# Patient Record
Sex: Male | Born: 1940 | Race: Black or African American | Marital: Single | Smoking: Former smoker
Health system: Southern US, Community
[De-identification: ages and names within clinical notes are randomized; demographics above are authoritative.]

## PROBLEM LIST (undated history)

## (undated) DIAGNOSIS — E119 Type 2 diabetes mellitus without complications: Secondary | ICD-10-CM

## (undated) DIAGNOSIS — E785 Hyperlipidemia, unspecified: Secondary | ICD-10-CM

## (undated) DIAGNOSIS — I2510 Atherosclerotic heart disease of native coronary artery without angina pectoris: Secondary | ICD-10-CM

## (undated) DIAGNOSIS — Z720 Tobacco use: Secondary | ICD-10-CM

## (undated) DIAGNOSIS — I10 Essential (primary) hypertension: Secondary | ICD-10-CM

## (undated) HISTORY — PX: CARDIAC CATHETERIZATION: SHX172

## (undated) HISTORY — DX: Hyperlipidemia, unspecified: E78.5

---

## 2004-07-21 ENCOUNTER — Emergency Department: Payer: Self-pay | Admitting: General Practice

## 2006-04-22 ENCOUNTER — Emergency Department: Payer: Self-pay | Admitting: Emergency Medicine

## 2008-12-24 ENCOUNTER — Ambulatory Visit: Payer: Self-pay | Admitting: Internal Medicine

## 2008-12-31 ENCOUNTER — Ambulatory Visit: Payer: Self-pay | Admitting: Internal Medicine

## 2009-07-05 ENCOUNTER — Ambulatory Visit: Payer: Self-pay | Admitting: Internal Medicine

## 2009-10-31 ENCOUNTER — Ambulatory Visit: Payer: Self-pay | Admitting: Internal Medicine

## 2012-09-11 HISTORY — PX: CORONARY ANGIOPLASTY: SHX604

## 2012-09-30 ENCOUNTER — Encounter (HOSPITAL_COMMUNITY)
Admission: EM | Disposition: A | Discharge status: Home / Self Care | Payer: Self-pay | Source: Other Acute Inpatient Hospital | Attending: Cardiovascular Disease

## 2012-09-30 ENCOUNTER — Emergency Department: Payer: Self-pay | Admitting: Emergency Medicine

## 2012-09-30 ENCOUNTER — Encounter (HOSPITAL_COMMUNITY): Payer: Self-pay | Admitting: Internal Medicine

## 2012-09-30 ENCOUNTER — Other Ambulatory Visit: Payer: Self-pay

## 2012-09-30 ENCOUNTER — Inpatient Hospital Stay (HOSPITAL_COMMUNITY)
Admission: EM | Admit: 2012-09-30 | Discharge: 2012-10-03 | DRG: 247 | Disposition: A | Discharge status: Home / Self Care | Payer: PRIVATE HEALTH INSURANCE | Source: Other Acute Inpatient Hospital | Attending: Cardiovascular Disease | Admitting: Cardiovascular Disease

## 2012-09-30 DIAGNOSIS — Z79899 Other long term (current) drug therapy: Secondary | ICD-10-CM

## 2012-09-30 DIAGNOSIS — Z7982 Long term (current) use of aspirin: Secondary | ICD-10-CM

## 2012-09-30 DIAGNOSIS — I2582 Chronic total occlusion of coronary artery: Secondary | ICD-10-CM | POA: Diagnosis present

## 2012-09-30 DIAGNOSIS — I2510 Atherosclerotic heart disease of native coronary artery without angina pectoris: Secondary | ICD-10-CM

## 2012-09-30 DIAGNOSIS — I471 Supraventricular tachycardia, unspecified: Secondary | ICD-10-CM | POA: Diagnosis present

## 2012-09-30 DIAGNOSIS — Z955 Presence of coronary angioplasty implant and graft: Secondary | ICD-10-CM

## 2012-09-30 DIAGNOSIS — I2129 ST elevation (STEMI) myocardial infarction involving other sites: Principal | ICD-10-CM

## 2012-09-30 DIAGNOSIS — E876 Hypokalemia: Secondary | ICD-10-CM | POA: Diagnosis not present

## 2012-09-30 DIAGNOSIS — N179 Acute kidney failure, unspecified: Secondary | ICD-10-CM | POA: Diagnosis present

## 2012-09-30 DIAGNOSIS — I10 Essential (primary) hypertension: Secondary | ICD-10-CM | POA: Diagnosis present

## 2012-09-30 DIAGNOSIS — Z720 Tobacco use: Secondary | ICD-10-CM

## 2012-09-30 DIAGNOSIS — F17200 Nicotine dependence, unspecified, uncomplicated: Secondary | ICD-10-CM | POA: Diagnosis present

## 2012-09-30 DIAGNOSIS — Z7902 Long term (current) use of antithrombotics/antiplatelets: Secondary | ICD-10-CM

## 2012-09-30 DIAGNOSIS — I959 Hypotension, unspecified: Secondary | ICD-10-CM | POA: Diagnosis present

## 2012-09-30 HISTORY — DX: Atherosclerotic heart disease of native coronary artery without angina pectoris: I25.10

## 2012-09-30 HISTORY — DX: Tobacco use: Z72.0

## 2012-09-30 HISTORY — PX: LEFT HEART CATH: SHX5478

## 2012-09-30 HISTORY — PX: PERCUTANEOUS CORONARY STENT INTERVENTION (PCI-S): SHX5485

## 2012-09-30 HISTORY — DX: Essential (primary) hypertension: I10

## 2012-09-30 HISTORY — PX: LEFT HEART CATHETERIZATION WITH CORONARY ANGIOGRAM: SHX5451

## 2012-09-30 LAB — CBC
HGB: 14.1 g/dL (ref 13.0–18.0)
MCH: 29.8 pg (ref 26.0–34.0)
MCV: 92 fL (ref 80–100)
RBC: 4.73 10*6/uL (ref 4.40–5.90)
WBC: 14.8 10*3/uL — ABNORMAL HIGH (ref 3.8–10.6)

## 2012-09-30 LAB — BASIC METABOLIC PANEL
BUN: 23 mg/dL — ABNORMAL HIGH (ref 7–18)
Chloride: 103 mmol/L (ref 98–107)
Co2: 27 mmol/L (ref 21–32)
EGFR (African American): 39 — ABNORMAL LOW
Osmolality: 277 (ref 275–301)
Sodium: 136 mmol/L (ref 136–145)

## 2012-09-30 LAB — PRO B NATRIURETIC PEPTIDE: B-Type Natriuretic Peptide: 13559 pg/mL — ABNORMAL HIGH (ref 0–125)

## 2012-09-30 LAB — HEPATIC FUNCTION PANEL A (ARMC)
Bilirubin, Direct: <0.05 mg/dL (ref 0.00–0.20)
Bilirubin,Total: 0.6 mg/dL (ref 0.2–1.0)
SGOT(AST): 164 U/L — ABNORMAL HIGH (ref 15–37)
SGPT (ALT): 41 U/L (ref 12–78)
Total Protein: 7.2 g/dL (ref 6.4–8.2)

## 2012-09-30 LAB — MAGNESIUM: Magnesium: 1.9 mg/dL

## 2012-09-30 LAB — TROPONIN I
Troponin I: >20 ng/mL (ref <0.30)
Troponin-I: 24 ng/mL — ABNORMAL HIGH

## 2012-09-30 LAB — TSH: Thyroid Stimulating Horm: 3.31 u[IU]/mL

## 2012-09-30 LAB — MRSA PCR SCREENING: MRSA by PCR: NEGATIVE

## 2012-09-30 SURGERY — LEFT HEART CATHETERIZATION WITH CORONARY ANGIOGRAM
Anesthesia: LOCAL

## 2012-09-30 MED ORDER — TICAGRELOR 90 MG PO TABS
ORAL_TABLET | ORAL | Status: AC
  Filled 2012-09-30: qty 2

## 2012-09-30 MED ORDER — ATROPINE SULFATE 1 MG/ML IJ SOLN
INTRAMUSCULAR | Status: AC
  Filled 2012-09-30: qty 1

## 2012-09-30 MED ORDER — OXYCODONE-ACETAMINOPHEN 5-325 MG PO TABS: 1.0000 | ORAL_TABLET | ORAL | Status: DC | PRN

## 2012-09-30 MED ORDER — SODIUM CHLORIDE 0.9 % IV SOLN: INTRAVENOUS | Status: DC

## 2012-09-30 MED ORDER — ACETAMINOPHEN 325 MG PO TABS: 650.0000 mg | ORAL_TABLET | ORAL | Status: DC | PRN

## 2012-09-30 MED ORDER — ONDANSETRON HCL 4 MG/2ML IJ SOLN: 4.0000 mg | Freq: Four times a day (QID) | INTRAMUSCULAR | Status: DC | PRN

## 2012-09-30 MED ORDER — ASPIRIN EC 81 MG PO TBEC
81.0000 mg | DELAYED_RELEASE_TABLET | Freq: Every day | ORAL | Status: DC
  Administered 2012-10-01 – 2012-10-03 (×3): 81 mg via ORAL
  Filled 2012-09-30 (×3): qty 1

## 2012-09-30 MED ORDER — ATORVASTATIN CALCIUM 80 MG PO TABS
80.0000 mg | ORAL_TABLET | Freq: Every day | ORAL | Status: DC
  Administered 2012-09-30 – 2012-10-02 (×3): 80 mg via ORAL
  Filled 2012-09-30 (×4): qty 1

## 2012-09-30 MED ORDER — NITROGLYCERIN 0.4 MG SL SUBL: 0.4000 mg | SUBLINGUAL_TABLET | SUBLINGUAL | Status: DC | PRN

## 2012-09-30 MED ORDER — ASPIRIN 81 MG PO CHEW: 81.0000 mg | CHEWABLE_TABLET | Freq: Every day | ORAL | Status: DC

## 2012-09-30 MED ORDER — ASPIRIN 81 MG PO CHEW: 324.0000 mg | CHEWABLE_TABLET | ORAL | Status: DC

## 2012-09-30 MED ORDER — ASPIRIN 300 MG RE SUPP: 300.0000 mg | RECTAL | Status: DC

## 2012-09-30 MED ORDER — METOPROLOL TARTRATE 25 MG PO TABS
25.0000 mg | ORAL_TABLET | Freq: Two times a day (BID) | ORAL | Status: DC
  Administered 2012-09-30 – 2012-10-03 (×6): 25 mg via ORAL
  Filled 2012-09-30 (×7): qty 1

## 2012-09-30 MED ORDER — MORPHINE SULFATE 2 MG/ML IJ SOLN: 2.0000 mg | INTRAMUSCULAR | Status: DC | PRN

## 2012-09-30 MED ORDER — TICAGRELOR 90 MG PO TABS
90.0000 mg | ORAL_TABLET | Freq: Two times a day (BID) | ORAL | Status: DC
  Administered 2012-10-01 – 2012-10-03 (×5): 90 mg via ORAL
  Filled 2012-09-30 (×6): qty 1

## 2012-09-30 NOTE — CV Procedure (Signed)
   Cardiac Catheterization Operative Report  Thomas Sexton [id number] 5/20/20147:07 PM No primary provider on file.  Procedure Performed:  1. Left Heart Catheterization 2. Selective Coronary Angiography 3. Left ventricular pressures 4. PTCA/DES x 1 first obtuse marginal   Operator: [name], MD  Indication:   72 yo male with history of tobacco abuse transferred from [facility] with lateral STEMI. Presented to [facility] today with c/o SOB. Found to be in SVT. He converted to NSR but had ST elevation noted laterally. No CP. Code STEMI called and pt transferred to [facility].                                   Procedure Details: Emergency consent obtained.  The patient concurred with the proposed plan, giving informed verbal consent. The patient was brought to the cath lab via EMS. The patient was further sedated with Versed and Fentanyl. The right groin was prepped and draped in the usual manner. Using the modified Seldinger access technique, a 6 French sheath was placed in the right femoral artery. Standard diagnostic catheters were used to perform selective coronary angiography. The JR4 was used to cross the AV into the LV. Pressures were measured. No LV gram. He was found to have a total occlusion of the first OM branch of the Circumflex.   PCI Note: The left main was engaged with a XB 3.0 guide. He was given a bolus of Angiomax and a drip was started. He was given Brilinta 180 mg po x 1. When the ACT was greater than 200, I passed a BMW wire down the Circumflex into the first OM branch beyond the total occlusion. I then pre-dilated the stenosis with a 2.5 x 12 mm balloon x 1. I then carefully positioned and deployed a 2.5 x 16 mm Promus Premier DES in the first OM branch. The stent was post-dilated with a 2.5 x 12 mm Starks balloon x 1. IC NTG given ( x2). There was significant wire bias. The wire was pulled back and the vessel was noted to be widely patent. There was no edge tear noted. There  was excellent flow into the distal vessel.   There were no immediate complications. The patient was taken to the CCU in stable condition.   Hemodynamic Findings: Central aortic pressure: 105/66 Left ventricular pressure: 116/3/9  Angiographic Findings:  Left main: No obstructive disease.   Left Anterior Descending Artery: Large caliber vessel that courses to the apex with 40% mid stenosis, 30% distal stenosis. The diagonal branch is moderate in caliber with 50% stenosis.   Circumflex Artery: Moderate caliber vessel with mild plaque proximally with 100% occlusion of the first OM branch.  Right Coronary Artery: Moderate caliber, dominant vessel with 30% mid stenosis. The PDA has 30% proximal stenosis.   Left Ventricular Angiogram: Deferred.   Impression: 1. Acute lateral STEMI secondary to occluded first obtuse marginal branch 2. 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 Successful PTCA/DES x 1 first OM  Recommendations: ASA and Brilinta for one year. Will start beta blocker, statin. Will get echo in am to assess LVEF.        Complications:  None. The patient tolerated the procedure well.

## 2012-09-30 NOTE — H&P (Signed)
History and Physical  Patient ID: Thomas Sexton MRN: [id number], SOB: 31-Mar-1941 72 y.o. Date of Encounter: 09/30/2012, 6:27 PM  Primary Physician: No primary provider on file. Primary Cardiologist: None  Chief Complaint: SOB, STEMI  HPI: 72 y.o. male w/ PMHx significant for HTN who presented to [facility] on 09/30/2012 with complaints of SOB.  The patient notes a 2-day history of feelings of shortness of breath, with associated lightheadedness and nausea.  Symptoms are not worsened by exertion or relieved by rest, and are not associated with any chest pain or palpitations.  He presented to [facility] for evaluation of this SOB, and was found to be in SVT with a rate in the 180's.  After breaking the SVT (?chemical, ?electrical), the patient's HR reduced to the low 100's, with evidence of ST elevation in infero-lateral leads (I, II, V5, V6).  He was given aspirin and a heparin bolus, transferred to [facility], and taken directly to the cath lab. Labs are significant for a creatinine of 1.9 at .   Past Medical History  Diagnosis Date  . Hypertension      Surgical History: No past surgical history on file.   Home Meds: Prior to Admission medications   Not on File  The patient notes no home medications (including no aspirin)  Allergies: No Known Allergies  History   Social History  . Marital Status: N/A    Spouse Name: N/A    Number of Children: N/A  . Years of Education: N/A   Occupational History  . Not on file.   Social History Main Topics  . Smoking status: Current Every Day Smoker -- 50 years  . Smokeless tobacco: Not on file  . Alcohol Use: No  . Drug Use: Not on file  . Sexually Active: Not on file   Other Topics Concern  . Not on file   Social History Narrative  . No narrative on file     No family history on file. No family history of heart disease  Review of Systems: General: negative for chills, fever, night sweats or weight changes.   ENT: negative for rhinorrhea or epistaxis Cardiovascular: negative for chest pain, edema, orthopnea, palpitations, or paroxysmal nocturnal dyspnea Dermatological: negative for rash Respiratory: negative for cough or wheezing GI: negative for diarrhea, bright red blood per rectum, melena, or hematemesis GU: no hematuria, urgency, or frequency Neurologic: negative for visual changes, syncope, headache, or dizziness Heme: no easy bruising or bleeding Endo: negative for excessive thirst, thyroid disorder, or flushing Musculoskeletal: negative for joint pain or swelling, negative for myalgias All other systems reviewed and are otherwise negative except as noted above.  Physical Exam: There were no vitals taken for this visit. General: Well developed, thin, alert and oriented, in no acute distress. HEENT: Normocephalic, atraumatic, sclera non-icteric Neck: Supple. JVP normal Lungs: Clear bilaterally to auscultation  Heart: RRR Abdomen: Soft, non-tender, non-distended  Msk:  Strength and tone appear normal for age. Extremities: No clubbing, cyanosis, or edema.  Neuro: CNII-XII intact, moves all extremities spontaneously. Psych:  Responds to questions appropriately with a normal affect.   Labs:   No results found for this basename: WBC, HGB, HCT, MCV, PLT   No results found for this basename: NA, K, CL, CO2, BUN, CREATININE, CALCIUM, LABALBU, PROT, BILITOT, ALKPHOS, ALT, AST, GLUCOSE,  in the last 168 hours No results found for this basename: CKTOTAL, CKMB, TROPONINI,  in the last 72 hours No results found for this basename: CHOL, HDL,  LDLCALC, TRIG   No results found for this basename: DDIMER   Outside Labs from [facility] WBC: 14.8 Hb: 14.1 HCT: 43.3 Plt: 214  Na: 136 K: 4.3 Cl: 103 CO2: 27 BUN: 23 Cr: 1.92 Glucose: 131 Ca: 9.4 Mg: 1.9 Albumin: 3.9  D-dimer: 0.29  Radiology/Studies:  No results found.   EKG: Sinus tachycardia, ST elevation in leads I, II,  V5, V6  ASSESSMENT AND PLAN:  The patient is a 73 yo man, history of HTN, presenting with STEMI.  # STEMI - the patient presents with ST elevations, which appeared after an episode of SVT.  The patient was completely chest pain free, but does note SOB for 2 days prior to presentation.  This may represent acute vessel occlusion vs induced ischemia from SVT.  The patient was taken directly to cath lab.  He was given aspirin and heparin bolus at [place]. -taken for cardiac cath -continue aspirin -started on brilinta, plan to transition to effient -start atorvastatin -if BP tolerates, start metoprolol -oxygen, morphine prn -lipid panel, A1C in AM for risk stratification  # Hypertension - patient notes history of HTN, but not on any outpatient BP meds -start metoprolol if BP tolerates  # AKI vs CKD - the patient's creatinine was 1.9 at [place].  It's unclear if this is an acute change or the patient's baseline. -repeat BMET -monitor I/O's -start IVF following cath  SignedJanalyn Harder  09/30/2012, 6:27 PM  I have personally seen and examined this patient with Janalyn Harder, MD. I agree with the assessment and plan as outlined above. Lateral STEMI. C/o SOB. Plans for emergent cardiac cath tonight with possible PCI.   MCALHANY,CHRISTOPHER 7:27 PM 09/30/2012

## 2012-10-01 DIAGNOSIS — N179 Acute kidney failure, unspecified: Secondary | ICD-10-CM | POA: Diagnosis present

## 2012-10-01 DIAGNOSIS — I2510 Atherosclerotic heart disease of native coronary artery without angina pectoris: Secondary | ICD-10-CM

## 2012-10-01 DIAGNOSIS — I10 Essential (primary) hypertension: Secondary | ICD-10-CM | POA: Diagnosis present

## 2012-10-01 DIAGNOSIS — E876 Hypokalemia: Secondary | ICD-10-CM | POA: Diagnosis present

## 2012-10-01 LAB — CBC
HCT: 36.6 % — ABNORMAL LOW (ref 39.0–52.0)
MCHC: 34.2 g/dL (ref 30.0–36.0)
Platelets: 200 10*3/uL (ref 150–400)
RDW: 13.1 % (ref 11.5–15.5)
WBC: 12.7 10*3/uL — ABNORMAL HIGH (ref 4.0–10.5)

## 2012-10-01 LAB — BASIC METABOLIC PANEL
BUN: 27 mg/dL — ABNORMAL HIGH (ref 6–23)
Chloride: 102 mEq/L (ref 96–112)
GFR calc Af Amer: 56 mL/min — ABNORMAL LOW (ref >90)
GFR calc non Af Amer: 48 mL/min — ABNORMAL LOW (ref >90)
Potassium: 3.3 mEq/L — ABNORMAL LOW (ref 3.5–5.1)

## 2012-10-01 LAB — TROPONIN I: Troponin I: >20 ng/mL (ref <0.30)

## 2012-10-01 MED ORDER — SODIUM CHLORIDE 0.9 % IV SOLN
INTRAVENOUS | Status: DC
  Administered 2012-10-01 – 2012-10-03 (×4): via INTRAVENOUS

## 2012-10-01 MED ORDER — BOOST / RESOURCE BREEZE PO LIQD
1.0000 | Freq: Two times a day (BID) | ORAL | Status: DC
  Administered 2012-10-01: 0.9875 via ORAL
  Administered 2012-10-01 – 2012-10-03 (×4): 1 via ORAL

## 2012-10-01 MED ORDER — POTASSIUM CHLORIDE CRYS ER 20 MEQ PO TBCR
40.0000 meq | EXTENDED_RELEASE_TABLET | ORAL | Status: AC
  Administered 2012-10-01 (×2): 40 meq via ORAL
  Filled 2012-10-01: qty 2

## 2012-10-01 MED ORDER — SODIUM CHLORIDE 0.9 % IJ SOLN
3.0000 mL | Freq: Two times a day (BID) | INTRAMUSCULAR | Status: DC
  Administered 2012-10-02 – 2012-10-03 (×2): 3 mL via INTRAVENOUS

## 2012-10-01 MED ORDER — POTASSIUM CHLORIDE CRYS ER 20 MEQ PO TBCR
EXTENDED_RELEASE_TABLET | ORAL | Status: AC
  Filled 2012-10-01: qty 2

## 2012-10-01 MED FILL — Fentanyl Citrate Inj 0.05 MG/ML: INTRAMUSCULAR | Qty: 2 | Status: AC

## 2012-10-01 MED FILL — Heparin Sodium (Porcine) 2 Unit/ML in Sodium Chloride 0.9%: INTRAMUSCULAR | Qty: 2000 | Status: AC

## 2012-10-01 MED FILL — Bivalirudin Trifluoroacetate For IV Soln 250 MG (Base Equiv): INTRAVENOUS | Qty: 250 | Status: AC

## 2012-10-01 MED FILL — Sodium Chloride IV Soln 0.9%: INTRAVENOUS | Qty: 50 | Status: AC

## 2012-10-01 MED FILL — Midazolam HCl Inj 2 MG/2ML (Base Equivalent): INTRAMUSCULAR | Qty: 2 | Status: AC

## 2012-10-01 MED FILL — Nitroglycerin IV Soln 5 MG/ML: INTRAVENOUS | Qty: 10 | Status: AC

## 2012-10-01 MED FILL — Lidocaine HCl Local Preservative Free (PF) Inj 1%: INTRAMUSCULAR | Qty: 30 | Status: AC

## 2012-10-01 NOTE — Progress Notes (Signed)
CARDIAC REHAB PHASE I   PRE:  Rate/Rhythm: 70 SR    BP: lying 110/49, sitting 129/77, standing 114/70    SaO2: 97 RA  MODE:  Ambulation: 350 ft   POST:  Rate/Rhythm: 83 Sr     BP: sitting 98/76     SaO2: 99 RA  Took orthostatics, no problems, no sx. Slightly wobbly walking, no c/o. BP slightly lower upon return to room. To recliner. Began ed process, voiced understanding. Will f/u. [id number]   Elissa Lovett [place] CES, ACSM 10/01/2012 1:32 PM

## 2012-10-01 NOTE — Progress Notes (Signed)
Rt femoral sheath dicontued post ACT (165). Manual pressure held to site for with good effect. Rt groin site soft, no hematoma and pressure drsg applied. Post cath instructions given to pt re keeping rt leg straight (bedrest),apply pressure to site with movement (sneezing, coughing etc) Pt verbalized understanding. Will cont to monitor closely.

## 2012-10-01 NOTE — Progress Notes (Signed)
Echocardiogram 2D Echocardiogram has been performed.  Thomas Sexton 10/01/2012, 1:16 PM

## 2012-10-01 NOTE — Progress Notes (Signed)
Subjective:  The patient was admitted yesterday with mild SOB, found to have obstruction of his 1st OM, s/p BMS.  Overnight, the patient notes no SOB, cp, LH, or palpitations.  The patient's vitals were stable overnight, though this morning his BP has dropped to the SBP 80's and his HR has increased to the 130's.    Objective:  Vital Signs in the last 24 hours: Temp:  [97.6 F (36.4 C)-98.5 F (36.9 C)] 97.6 F (36.4 C) (05/21 0400) Pulse Rate:  [67-117] 117 (05/21 0615) Resp:  [5-26] 23 (05/21 0615) BP: (74-183)/(36-170) 74/48 mmHg (05/21 0615) SpO2:  [90 %-100 %] 97 % (05/21 0615) Arterial Line BP: (128-201)/(73-137) 184/125 mmHg (05/20 2145) Weight:  [134 lb 9 oz (61.037 kg)-134 lb 14.4 oz (61.19 kg)] 134 lb 9 oz (61.037 kg) (05/20 2053)  Intake/Output from previous day: 05/20 0701 - 05/21 0700 In: 341.7 [P.O.:80; I.V.:261.7] Out: 350 [Urine:350]  Physical Exam: General: alert, cooperative, and in no apparent distress HEENT: pupils equal round and reactive to light, vision grossly intact, oropharynx clear and non-erythematous  Neck: supple, no lymphadenopathy, no JVD Lungs: clear to ascultation bilaterally, normal work of respiration, no wheezes, rales, ronchi Heart: tachycardic, regular rhythm, loud S1, no m/g/r Abdomen: soft, non-tender, non-distended, normal bowel sounds Extremities: no cyanosis, clubbing, or edema Neurologic: alert & oriented X3, cranial nerves II-XII intact, strength grossly intact, sensation intact to light touch   Lab Results:  Recent Labs  10/01/12 0203  WBC 12.7*  HGB 12.5*  PLT 200    Recent Labs  10/01/12 0203  NA 136  K 3.3*  CL 102  CO2 21  GLUCOSE 95  BUN 27*  CREATININE 1.41*    Recent Labs  09/30/12 2053  TROPONINI >20.00*    Cardiac Studies: Central aortic pressure: 105/66  Left ventricular pressure: 116/3/9  Angiographic Findings:  Left main: No obstructive disease.  Left Anterior Descending Artery: Large  caliber vessel that courses to the apex with 40% mid stenosis, 30% distal stenosis. The diagonal branch is moderate in caliber with 50% stenosis.  Circumflex Artery: Moderate caliber vessel with mild plaque proximally with 100% occlusion of the first OM branch.  Right Coronary Artery: Moderate caliber, dominant vessel with 30% mid stenosis. The PDA has 30% proximal stenosis.  Left Ventricular Angiogram: Deferred.  Impression:  1. Acute lateral STEMI secondary to occluded first obtuse marginal branch  2. 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 Successful PTCA/DES x 1 first OM  Recommendations: ASA and Brilinta for one year. Will start beta blocker, statin. Will get echo in am to assess LVEF.  Complications: None. The patient tolerated the procedure well.   Tele: NSR mostly in the 70's overnight, though with a spike to the 130's this morning around 6am  Assessment/Plan:  The patient is a 72 yo man, history of HTN, presenting with STEMI.   # STEMI - the patient presents with ST elevations, which appeared after an episode of SVT. The patient was completely chest pain free, but does note SOB for 2 days prior to presentation. This may represent acute vessel occlusion vs induced ischemia from SVT. The patient was taken directly to cath lab. He was given aspirin and heparin bolus at [place].  -taken for cardiac cath  -continue aspirin, brilinta -start atorvastatin  -metoprolol started last night, consider holding of BP remains low -oxygen, morphine prn  -lipid panel, A1C in AM for risk stratification  -echo ordered, pending  # Hypotension -  patient notes history of HTN, but not on any outpatient BP meds.  This morning, the patient's BP has dropped to the SBP 80's, with a concomitant rise in HR. -check orthostatic vitals  -start NS @ 75 cc/hr -hold metoprolol if BP remains low  # AKI vs CKD - the patient's creatinine was 1.9 at [place], and has downtrended to 1.4 this morning,  with recorded UOP of only 350 cc.  Differential includes pre-renal vs ATN. -monitor I/O's  -continue IVF  # HypokalemiaPatient examined and agree except changes made.  Valera Castle, MD 10/01/2012 8:33 AM Patient examined and agree except changes made. Transfer out this PM to telemtry. Push beta blockers.  Valera Castle, MD 10/01/2012 8:33 AM  Janalyn Harder, M.D. 10/01/2012, 6:25 AM

## 2012-10-01 NOTE — Progress Notes (Signed)
INITIAL NUTRITION ASSESSMENT  DOCUMENTATION CODES Per approved criteria  -Not Applicable   INTERVENTION: 1. Resource Breeze po BID, each supplement provides 250 kcal and 9 grams of protein.   NUTRITION DIAGNOSIS: Inadequate oral intake related to poor appetite as evidenced by pt reported weight loss.   Goal: PO intake to meet >/=90% estimated nutrition needs.   Monitor:  PO intake, supplement tolerance, weight trends, labs   Reason for Assessment: Malnutrition screening tool  72 y.o. male  Admitting Dx: ST elevation myocardial infarction (STEMI) of lateral wall  ASSESSMENT: Pt admitted with SOB x 2 days found to have STEMI, s/p cadiac cath.  Pt reports decreased appetite for about 1 week. Weight loss of 3 lbs since Wednesday. Currently on clear liquid diet and only drank his coffee this morning.   Height: Ht Readings from Last 1 Encounters:  09/30/12 5\' 8"  (1.727 m)    Weight: Wt Readings from Last 1 Encounters:  09/30/12 134 lb 9 oz (61.037 kg)    Ideal Body Weight: 154 lbs   % Ideal Body Weight: 87%  Wt Readings from Last 10 Encounters:  09/30/12 134 lb 9 oz (61.037 kg)  09/30/12 134 lb 9 oz (61.037 kg)    Usual Body Weight: 137 lbs   % Usual Body Weight: 98%  BMI:  Body mass index is 20.46 kg/(m^2). WNL  Estimated Nutritional Needs: Kcal: 1500-1700 Protein: 60-70 gm  Fluid: 1.5-1.7 L   Skin: intact   Diet Order: Clear Liquid  EDUCATION NEEDS: -No education needs identified at this time   Intake/Output Summary (Last 24 hours) at 10/01/12 1120 Last data filed at 10/01/12 1000  Gross per 24 hour  Intake 607.92 ml  Output    350 ml  Net 257.92 ml    Last BM: PTA    Labs:   Recent Labs Lab 10/01/12 0203  NA 136  K 3.3*  CL 102  CO2 21  BUN 27*  CREATININE 1.41*  CALCIUM 8.2*  GLUCOSE 95    CBG (last 3)  No results found for this basename: GLUCAP,  in the last 72 hours  Scheduled Meds: . aspirin EC  81 mg Oral Daily  .  atorvastatin  80 mg Oral q1800  . metoprolol tartrate  25 mg Oral BID  . potassium chloride SA      . potassium chloride  40 mEq Oral Q4H  . sodium chloride  3 mL Intravenous Q12H  . Ticagrelor  90 mg Oral BID    Continuous Infusions: . sodium chloride 75 mL/hr at 10/01/12 9604    Past Medical History  Diagnosis Date  . Hypertension     No past surgical history on file.  Clarene Duke RD, LDN Pager [phone] After Hours pager [phone]

## 2012-10-01 NOTE — Care Management Note (Addendum)
    Page 1 of 1   10/03/2012     3:34:30 PM   CARE MANAGEMENT NOTE 10/03/2012  Patient:  Thomas Sexton, Thomas Sexton   Account Number:  [id number]  Date Initiated:  10/01/2012  Documentation initiated by:  Junius Creamer  Subjective/Objective Assessment:   adm w mi     Action/Plan:   spoke w pt he has medicare and medicaid. have alerted fin co of ins status  lives w sister and brother in law   Anticipated DC Date:  10/03/2012   Anticipated DC Plan:  HOME/SELF CARE      DC Planning Services  CM consult  Medication Assistance      Choice offered to / List presented to:             Status of service:  Completed, signed off Medicare Important Message given?   (If response is "NO", the following Medicare IM given date fields will be blank) Date Medicare IM given:   Date Additional Medicare IM given:    Discharge Disposition:  HOME/SELF CARE  Per UR Regulation:  Reviewed for med. necessity/level of care/duration of stay  If discussed at Long Length of Stay Meetings, dates discussed:    Comments:  10/03/12 Boni Maclellan,RN,BSN [phone] FAXED COMPLETED PRIOR AUTH FORM TO MEDICAID, PER PROTOCOL.  5/21 0959 debbie dowell rn,bsn gave pt brilinta 30day free and copay assist card.will put medicaid prior approval form on chart. brilinta not on preferred list so will need piror approval.

## 2012-10-02 ENCOUNTER — Encounter (HOSPITAL_COMMUNITY): Payer: Self-pay | Admitting: *Deleted

## 2012-10-02 LAB — BASIC METABOLIC PANEL
Calcium: 8.3 mg/dL — ABNORMAL LOW (ref 8.4–10.5)
Chloride: 106 mEq/L (ref 96–112)
Creatinine, Ser: 1.47 mg/dL — ABNORMAL HIGH (ref 0.50–1.35)
Potassium: 4 mEq/L (ref 3.5–5.1)
Sodium: 139 mEq/L (ref 135–145)

## 2012-10-02 LAB — CBC
MCH: 30.1 pg (ref 26.0–34.0)
MCV: 89.5 fL (ref 78.0–100.0)
Platelets: 176 10*3/uL (ref 150–400)
RDW: 13.1 % (ref 11.5–15.5)

## 2012-10-02 NOTE — Progress Notes (Signed)
CARDIAC REHAB PHASE I   PRE:  Rate/Rhythm: 63 SR    BP: sitting 110/70    SaO2:   MODE:  Ambulation: 550 ft   POST:  Rate/Rhythm: 77 SR    BP: sitting 110/70     SaO2:   No c/o from pt, seems legs are weak and unsteady but probably at baseline. Pt sts he sometimes has to rest PTA due to tired legs. Pt also gets too close to objects on his right side, not clear why. Pt sts his vision is fine. To chair. Ed completed. Pt is seriously thinking about quitting smoking. Gave resources. Also interested in CRPII and will send his referral to [facility].  [id number]   Elissa Lovett [place] CES, ACSM 10/02/2012 12:12 PM

## 2012-10-02 NOTE — Progress Notes (Signed)
    SUBJECTIVE: Feels great this am. No chest pain or SOB. Last 24 hours with borderline BP.   BP 102/60  Pulse 75  Temp(Src) 99.2 F (37.3 C) (Oral)  Resp 18  Ht 5\' 8"  (1.727 m)  Wt 134 lb 9 oz (61.037 kg)  BMI 20.46 kg/m2  SpO2 100%  Intake/Output Summary (Last 24 hours) at 10/02/12 0720 Last data filed at 10/02/12 0300  Gross per 24 hour  Intake    615 ml  Output    800 ml  Net   -185 ml    PHYSICAL EXAM General: Well developed, well nourished, in no acute distress. Alert and oriented x 3.  Psych:  Good affect, responds appropriately Neck: No JVD. No masses noted.  Lungs: Clear bilaterally with no wheezes or rhonci noted.  Heart: RRR with no murmurs noted. Abdomen: Bowel sounds are present. Soft, non-tender.  Extremities: No lower extremity edema.   LABS: Basic Metabolic Panel:  Recent Labs  40/98/11 0203  NA 136  K 3.3*  CL 102  CO2 21  GLUCOSE 95  BUN 27*  CREATININE 1.41*  CALCIUM 8.2*   CBC:  Recent Labs  10/01/12 0203 10/02/12 0510  WBC 12.7* 8.0  HGB 12.5* 11.2*  HCT 36.6* 33.3*  MCV 88.6 89.5  PLT 200 176   Cardiac Enzymes:  Recent Labs  09/30/12 2053 10/01/12 0203 10/01/12 0830  TROPONINI >20.00* >20.00* >20.00*   Current Meds: . aspirin EC  81 mg Oral Daily  . atorvastatin  80 mg Oral q1800  . feeding supplement  1 Container Oral BID BM  . metoprolol tartrate  25 mg Oral BID  . sodium chloride  3 mL Intravenous Q12H  . Ticagrelor  90 mg Oral BID   Echo 10/02/12: Left ventricle: The cavity size was normal. Wall thickness was normal. Systolic function was normal. The estimated ejection fraction was in the range of 55% to 60%. There was an increased relative contribution of atrial contraction to ventricular filling. - Pulmonary arteries: PA peak pressure: 32mm Hg (S).  ASSESSMENT AND PLAN:  1. CAD/Lateral STEMI:  He is doing well post MI. Continue ASA, Effient, beta blocker, statin. Cardiac rehab today. Prefer 24 more hours  hospitalization post MI. Ambulate. BMET pending. Continue telemetry. Likely d/c tomorrow am.   [name]  5/22/20147:20 AM

## 2012-10-03 ENCOUNTER — Encounter (HOSPITAL_COMMUNITY): Payer: Self-pay | Admitting: Nurse Practitioner

## 2012-10-03 DIAGNOSIS — I2510 Atherosclerotic heart disease of native coronary artery without angina pectoris: Secondary | ICD-10-CM

## 2012-10-03 DIAGNOSIS — F17200 Nicotine dependence, unspecified, uncomplicated: Secondary | ICD-10-CM

## 2012-10-03 DIAGNOSIS — Z720 Tobacco use: Secondary | ICD-10-CM | POA: Diagnosis present

## 2012-10-03 MED ORDER — ATORVASTATIN CALCIUM 80 MG PO TABS: 80.0000 mg | ORAL_TABLET | Freq: Every day | ORAL | Status: DC

## 2012-10-03 MED ORDER — ASPIRIN 81 MG PO TBEC: 81.0000 mg | DELAYED_RELEASE_TABLET | Freq: Every day | ORAL | Status: AC

## 2012-10-03 MED ORDER — NITROGLYCERIN 0.4 MG SL SUBL: 0.4000 mg | SUBLINGUAL_TABLET | SUBLINGUAL | Status: AC | PRN

## 2012-10-03 MED ORDER — METOPROLOL TARTRATE 25 MG PO TABS: 25.0000 mg | ORAL_TABLET | Freq: Two times a day (BID) | ORAL | Status: DC

## 2012-10-03 MED ORDER — TICAGRELOR 90 MG PO TABS: 90.0000 mg | ORAL_TABLET | Freq: Two times a day (BID) | ORAL | Status: DC

## 2012-10-03 NOTE — Progress Notes (Signed)
10/03/2012 12:58 PM Nursing note Discharge avs, medications already taken today and those due this evening given and explained to patient and family. Location of called in RX also reviewed. Post cath site care reviewed. Follow up appointments and when to call MD discussed. Importance of taking Brilinta discussed. Reviewed with patient correct use of SL NTG using teach back method. Pt. Verbalized understanding. Questions and concerns addressed. D/c iv line. D/c tele. D/c home with family per orders.  Mirai Greenwood, [place]anchard Kelch

## 2012-10-03 NOTE — Progress Notes (Addendum)
    SUBJECTIVE: No chest pain or SOB.   BP 113/67  Pulse 74  Temp(Src) 98.6 F (37 C) (Oral)  Resp 18  Ht 5\' 8"  (1.727 m)  Wt 134 lb 9 oz (61.037 kg)  BMI 20.46 kg/m2  SpO2 98%  Intake/Output Summary (Last 24 hours) at 10/03/12 9604 Last data filed at 10/03/12 0600  Gross per 24 hour  Intake   1860 ml  Output   2250 ml  Net   -390 ml    PHYSICAL EXAM General: Well developed, well nourished, in no acute distress. Alert and oriented x 3.  Psych:  Good affect, responds appropriately Neck: No JVD. No masses noted.  Lungs: Clear bilaterally with no wheezes or rhonci noted.  Heart: RRR with no murmurs noted. Abdomen: Bowel sounds are present. Soft, non-tender.  Extremities: No lower extremity edema.   LABS: Basic Metabolic Panel:  Recent Labs  54/09/81 0203 10/02/12 0510  NA 136 139  K 3.3* 4.0  CL 102 106  CO2 21 21  GLUCOSE 95 103*  BUN 27* 27*  CREATININE 1.41* 1.47*  CALCIUM 8.2* 8.3*   CBC:  Recent Labs  10/01/12 0203 10/02/12 0510  WBC 12.7* 8.0  HGB 12.5* 11.2*  HCT 36.6* 33.3*  MCV 88.6 89.5  PLT 200 176   Cardiac Enzymes:  Recent Labs  09/30/12 2053 10/01/12 0203 10/01/12 0830  TROPONINI >20.00* >20.00* >20.00*   Current Meds: . aspirin EC  81 mg Oral Daily  . atorvastatin  80 mg Oral q1800  . feeding supplement  1 Container Oral BID BM  . metoprolol tartrate  25 mg Oral BID  . sodium chloride  3 mL Intravenous Q12H  . Ticagrelor  90 mg Oral BID     ASSESSMENT AND PLAN:  1. CAD/Lateral STEMI: He is doing well post MI. Continue ASA, Brilinta,  beta blocker, statin.  2. Tobacco abuse: Smoking cessation recommended.   3. Dispo: D/C home today. He can f/u with me, Tereso Newcomer, PA-C or Norma Fredrickson, NP in 2-3 weeks.     MCALHANY,CHRISTOPHER  5/23/20146:52 AM

## 2012-10-03 NOTE — Discharge Summary (Signed)
See full note this am. cdm 

## 2012-10-03 NOTE — Discharge Summary (Signed)
Patient ID: Thomas Sexton,  MRN: [id number], DOB/AGE: 1941-01-18 72 y.o.  Admit date: 09/30/2012 Discharge date: 10/03/2012  Primary Care Provider: None Primary Cardiologist:  Pt will f/u in [facility]  Discharge Diagnoses Principal Problem:   ST elevation myocardial infarction (STEMI) of lateral wall  **s/p PCI/DES to the OM1 this admission.  **Nl LV function by echo  Active Problems:   Coronary atherosclerosis of native coronary artery   Tobacco abuse   SVT   AKI (acute kidney injury)   Hypertension   Hypokalemia  Allergies No Known Allergies  Procedures  Cardiac Catheterization and Percutaneous Coronary Intervention 5.21.2014  Hemodynamic Findings: Central aortic pressure: 105/66 Left ventricular pressure: 116/3/9  Angiographic Findings:  Left main: No obstructive disease.   Left Anterior Descending Artery: Large caliber vessel that courses to the apex with 40% mid stenosis, 30% distal stenosis. The diagonal branch is moderate in caliber with 50% stenosis.   Circumflex Artery: Moderate caliber vessel with mild plaque proximally with 100% occlusion of the first OM branch.   **The OM1 was successfully stented using a 2.5 x 16 mm Promus Premier DES**  Right Coronary Artery: Moderate caliber, dominant vessel with 30% mid stenosis. The PDA has 30% proximal stenosis.  Left Ventricular Angiogram: Deferred.  _____________  2D Echocardiogram 5.21.2014  Study Conclusions  - Left ventricle: The cavity size was normal. Wall thickness   was normal. Systolic function was normal. The estimated   ejection fraction was in the range of 55% to 60%. There   was an increased relative contribution of atrial   contraction to ventricular filling. - Pulmonary arteries: PA peak pressure: 32mm Hg (S). _____________  History of Present Illness  72 y/o male without prior cardiac history.  He was in his USOH until 2 days prior to admission, when he began to experience intermittent  exertional dyspnea associated with lightheadedness and nausea.  On the evening of admission, he developed recurrent dyspnea and presented to [facility], where he was found to be in SVT with heart rates in the 180's.  SVT was reportedly treated with conversion to sinus tachycardia and inferolateral ST elevation was noted.  A Code STEMI was activated and pt was treated with asa and heparin and transferred to [facility] for further evaluation.   Hospital Course  Pt underwent urgent diagnostic catheterization.  Of note, creatinine was found to be elevated @ 1.9 while in the [facility] ED, thus steps were taken to limit contrast and LV gram was deferred.  Diagnostic imaging revealed a total occlusion of the OM1 with otherwise non-obstructive CAD.  The OM1 was successfully stented using a Promus Premier DES, as outlined above.  Pt tolerated procedure well and post-procedure was monitored in the coronary intensive care unit.  There, he ruled in for MI, eventually peaking his Troponin @ >20.  He was maintained on asa, brilinta, bb, and high potency statin therapy.  He was counseled on the importance of smoking cessation and evaluated by cardiac rehab.  He was transferred out to the floor on 5/21.  His renal function has remained stable.  2D echo showed normal LV function.  He has been ambulating without difficulty and is felt to be stable for discharge today.  Discharge Vitals Blood pressure 113/67, pulse 74, temperature 98.6 F (37 C), temperature source Oral, resp. rate 18, height 5\' 8"  (1.727 m), weight 134 lb 9 oz (61.037 kg), SpO2 98.00%.  Filed Weights   09/30/12 2000 09/30/12 2053  Weight: 134 lb 14.4 oz (61.19 kg) 134  lb 9 oz (61.037 kg)   Labs  CBC  Recent Labs  10/01/12 0203 10/02/12 0510  WBC 12.7* 8.0  HGB 12.5* 11.2*  HCT 36.6* 33.3*  MCV 88.6 89.5  PLT 200 176   Basic Metabolic Panel  Recent Labs  10/01/12 0203 10/02/12 0510  NA 136 139  K 3.3* 4.0  CL 102 106  CO2 21 21    GLUCOSE 95 103*  BUN 27* 27*  CREATININE 1.41* 1.47*  CALCIUM 8.2* 8.3*   Cardiac Enzymes  Recent Labs  09/30/12 2053 10/01/12 0203 10/01/12 0830  TROPONINI >20.00* >20.00* >20.00*   Disposition  Pt is being discharged home today in good condition.  Follow-up Plans & Appointments     Follow-up Information   Follow up with Julien Nordmann, MD On 10/09/2012. (3:15 PM)    Contact information:   [address] [place] [address] [phone]      Discharge Medications    Medication List    TAKE these medications       aspirin 81 MG EC tablet  Take 1 tablet (81 mg total) by mouth daily.     atorvastatin 80 MG tablet  Commonly known as:  LIPITOR  Take 1 tablet (80 mg total) by mouth daily at 6 PM.     cholecalciferol 1000 UNITS tablet  Commonly known as:  VITAMIN D  Take 1,000 Units by mouth daily.     metoprolol tartrate 25 MG tablet  Commonly known as:  LOPRESSOR  Take 1 tablet (25 mg total) by mouth 2 (two) times daily.     nitroGLYCERIN 0.4 MG SL tablet  Commonly known as:  NITROSTAT  Place 1 tablet (0.4 mg total) under the tongue every 5 (five) minutes x 3 doses as needed for chest pain.     Ticagrelor 90 MG Tabs tablet  Commonly known as:  BRILINTA  Take 1 tablet (90 mg total) by mouth 2 (two) times daily.     vitamin A 91478 UNIT capsule  Take 10,000 Units by mouth daily.     vitamin B-12 1000 MCG tablet  Commonly known as:  CYANOCOBALAMIN  Take 1,000 mcg by mouth daily.      Outstanding Labs/Studies  Follow-up lipids/lft's in 6-8 wks.  Duration of Discharge Encounter   Greater than 30 minutes including physician time.  Signed, Nicolasa Ducking NP 10/03/2012, 8:12 AM

## 2012-10-03 NOTE — Progress Notes (Addendum)
10/03/2012 11:30 AM Nursing note RN noted pt. HR down to 50-60 SB dropping as low as 45 non-sustaining after am metoprolol given. Pt. Resting in bed, asymptomatic, and states he "feels fine". BP 129/66. Ward Givens NP paged and made aware of findings. Verbal orders received ok to discharge patient. Orders enacted. Will continue to closely monitor patient.  Kenidee Cregan, Blanchard Kelch

## 2012-10-07 ENCOUNTER — Telehealth: Payer: Self-pay | Admitting: Cardiovascular Disease

## 2012-10-07 NOTE — Telephone Encounter (Signed)
Brilinta is indicated over Plavix in patients who presented with acute coronary syndrome and received a stent. Current recommendations suggest a mortality benefit with Brilinta over Plavix in this setting. He was admitted with ST elevation myocardial infarction. Thayer Ohm

## 2012-10-07 NOTE — Telephone Encounter (Signed)
Spoke with [place] who received prior authorization for patient's Brilinta.  It is preferred that patient try 2 of the referred medications for patient's insurance plan which are:  Ticlid, Aggrenox, Plavix, or Dipyridamole before taking Brilinta.  I informed her that we do not have patient records prior to patient's cath on 5/20 so do not know if patient has ever tried any of these medications.  She asked that I send this to Dr. Clifton James for review.  If patient can try one of these preferred medications we can just send the Rx to the patient's pharmacy.  If Dr. Clifton James wants the patient to only take Brilinta we need to send it back to her for review with reason.  I am routing to CM for advice.

## 2012-10-07 NOTE — Telephone Encounter (Signed)
New Prob     Needs some clarification on directions for [facility] prescription.

## 2012-10-07 NOTE — Telephone Encounter (Signed)
Reported Dr. Gibson Ramp recommendations to [place] at [facility].  [place] will send Brilinta Rx to pharmacy

## 2012-10-09 ENCOUNTER — Encounter: Payer: Self-pay | Admitting: Cardiovascular Disease

## 2012-10-09 ENCOUNTER — Ambulatory Visit (INDEPENDENT_AMBULATORY_CARE_PROVIDER_SITE_OTHER): Payer: PRIVATE HEALTH INSURANCE | Admitting: Cardiovascular Disease

## 2012-10-09 VITALS — BP 120/60 | HR 68 | Ht 68.0 in | Wt 136.5 lb

## 2012-10-09 DIAGNOSIS — F17200 Nicotine dependence, unspecified, uncomplicated: Secondary | ICD-10-CM

## 2012-10-09 DIAGNOSIS — I10 Essential (primary) hypertension: Secondary | ICD-10-CM

## 2012-10-09 DIAGNOSIS — Z720 Tobacco use: Secondary | ICD-10-CM

## 2012-10-09 DIAGNOSIS — N179 Acute kidney failure, unspecified: Secondary | ICD-10-CM

## 2012-10-09 DIAGNOSIS — I2129 ST elevation (STEMI) myocardial infarction involving other sites: Secondary | ICD-10-CM

## 2012-10-09 NOTE — Assessment & Plan Note (Signed)
Renal dysfunction on arrival at [facility]. 1.9 at [facility], this improved to 1.4 at [place].

## 2012-10-09 NOTE — Assessment & Plan Note (Signed)
We'll continue on metoprolol. Blood pressure well controlled

## 2012-10-09 NOTE — Patient Instructions (Addendum)
You are doing well. No medication changes were made.  Please call us if you have new issues that need to be addressed before your next appt.  Your physician wants you to follow-up in: 6 months.  You will receive a reminder letter in the mail two months in advance. If you don't receive a letter, please call our office to schedule the follow-up appointment.   

## 2012-10-09 NOTE — Progress Notes (Signed)
Patient ID: Thomas Sexton, male    DOB: [birth year], 72 y.o.   MRN: [id number]  HPI Comments: Thomas Sexton is a pleasant 72 year old gentleman who presented to [facility] on May 20 with chest pain, found to have significant EKG changes concerning for STEMI, transferred to [facility], cardiac catheterization Oct 01, 2011 with bare-metal stent placed to his OM1. Also found to have 40% mid and 30% distal LAD disease, 50% diagonal disease, 30% RCA disease in the mid region with 30% proximal PDA disease. Troponin was greater than 20. Echocardiogram 10/01/2012 showing normal ejection fraction estimated at 55-60% with no notable wall motion abnormality.  He presents today for followup after recent hospitalization. He denies any significant shortness of breath, no chest pain or lightheadedness. He feels well, has started his activities/ADLs.  He has been compliant with his medications, taking aspirin and brilinta twice a day. Reports that brilinta was not bad in [place], only $19. Overall he has no complaints.  Laboratory the hospital showed creatinine 1.9, AST 164 with ALT 41, BNP at that time was 13,000, troponin 24  EKG from [facility] showed lateral ST elevation in V4 through V6 EKG today shows T-wave abnormality in V4 through V6, leads 2, 3, aVF   Outpatient Encounter Prescriptions as of 10/09/2012  Medication Sig Dispense Refill  . aspirin EC 81 MG EC tablet Take 1 tablet (81 mg total) by mouth daily.      Marland Kitchen atorvastatin (LIPITOR) 80 MG tablet Take 1 tablet (80 mg total) by mouth daily at 6 PM.  30 tablet  6  . cholecalciferol (VITAMIN D) 1000 UNITS tablet Take 1,000 Units by mouth daily.      . metoprolol tartrate (LOPRESSOR) 25 MG tablet Take 1 tablet (25 mg total) by mouth 2 (two) times daily.  60 tablet  6  . nitroGLYCERIN (NITROSTAT) 0.4 MG SL tablet Place 1 tablet (0.4 mg total) under the tongue every 5 (five) minutes x 3 doses as needed for chest pain.  25 tablet  3  . Ticagrelor (BRILINTA)  90 MG TABS tablet Take 1 tablet (90 mg total) by mouth 2 (two) times daily.  60 tablet  6  . vitamin A 40981 UNIT capsule Take 10,000 Units by mouth daily.      . vitamin B-12 (CYANOCOBALAMIN) 1000 MCG tablet Take 1,000 mcg by mouth daily.       No facility-administered encounter medications on file as of 10/09/2012.     Review of Systems  Constitutional: Negative.   HENT: Negative.   Eyes: Negative.   Respiratory: Negative.   Cardiovascular: Negative.   Gastrointestinal: Negative.   Musculoskeletal: Negative.   Skin: Negative.   Neurological: Negative.   Psychiatric/Behavioral: Negative.   All other systems reviewed and are negative.    BP 120/60  Pulse 68  Ht 5\' 8"  (1.727 m)  Wt 136 lb 8 oz (61.916 kg)  BMI 20.76 kg/m2  Physical Exam  Nursing note and vitals reviewed. Constitutional: He is oriented to person, place, and time. He appears well-developed and well-nourished.  HENT:  Head: Normocephalic.  Nose: Nose normal.  Mouth/Throat: Oropharynx is clear and moist.  Eyes: Conjunctivae are normal. Pupils are equal, round, and reactive to light.  Neck: Normal range of motion. Neck supple. No JVD present.  Cardiovascular: Normal rate, regular rhythm, S1 normal, S2 normal, normal heart sounds and intact distal pulses.  Exam reveals no gallop and no friction rub.   No murmur heard. Pulmonary/Chest: Effort normal and breath sounds  normal. No respiratory distress. He has no wheezes. He has no rales. He exhibits no tenderness.  Abdominal: Soft. Bowel sounds are normal. He exhibits no distension. There is no tenderness.  Musculoskeletal: Normal range of motion. He exhibits no edema and no tenderness.  Lymphadenopathy:    He has no cervical adenopathy.  Neurological: He is alert and oriented to person, place, and time. Coordination normal.  Skin: Skin is warm and dry. No rash noted. No erythema.  Psychiatric: He has a normal mood and affect. His behavior is normal. Judgment and  thought content normal.      Assessment and Plan

## 2012-10-09 NOTE — Assessment & Plan Note (Signed)
He has not smoked since stent was placed. Encouraged him to not go back to smoking.

## 2012-10-09 NOTE — Assessment & Plan Note (Signed)
Bare-metal stent placed to 100% occluded OM1 vessel may 20th 2014. Doing well in recovery one week later. Instructed him to stay on his current medications

## 2013-03-30 ENCOUNTER — Encounter: Payer: Self-pay | Admitting: Cardiovascular Disease

## 2013-03-30 ENCOUNTER — Ambulatory Visit (INDEPENDENT_AMBULATORY_CARE_PROVIDER_SITE_OTHER): Payer: PRIVATE HEALTH INSURANCE | Admitting: Cardiovascular Disease

## 2013-03-30 VITALS — BP 120/78 | HR 80 | Ht 67.0 in | Wt 142.0 lb

## 2013-03-30 DIAGNOSIS — I10 Essential (primary) hypertension: Secondary | ICD-10-CM

## 2013-03-30 DIAGNOSIS — F17200 Nicotine dependence, unspecified, uncomplicated: Secondary | ICD-10-CM

## 2013-03-30 DIAGNOSIS — E785 Hyperlipidemia, unspecified: Secondary | ICD-10-CM

## 2013-03-30 DIAGNOSIS — Z720 Tobacco use: Secondary | ICD-10-CM

## 2013-03-30 DIAGNOSIS — I2510 Atherosclerotic heart disease of native coronary artery without angina pectoris: Secondary | ICD-10-CM

## 2013-03-30 DIAGNOSIS — R0989 Other specified symptoms and signs involving the circulatory and respiratory systems: Secondary | ICD-10-CM

## 2013-03-30 MED ORDER — METOPROLOL TARTRATE 25 MG PO TABS: 25.0000 mg | ORAL_TABLET | Freq: Two times a day (BID) | ORAL | Status: DC

## 2013-03-30 MED ORDER — TICAGRELOR 90 MG PO TABS: 90.0000 mg | ORAL_TABLET | Freq: Two times a day (BID) | ORAL | Status: DC

## 2013-03-30 MED ORDER — ATORVASTATIN CALCIUM 80 MG PO TABS: 80.0000 mg | ORAL_TABLET | Freq: Every day | ORAL | Status: DC

## 2013-03-30 NOTE — Assessment & Plan Note (Signed)
Doing well, no symptoms of angina. Continue current medication regimen

## 2013-03-30 NOTE — Assessment & Plan Note (Signed)
Blood pressure is well controlled on today's visit. No changes made to the medications. 

## 2013-03-30 NOTE — Assessment & Plan Note (Signed)
Cholesterol is at goal on the current lipid regimen. No changes to the medications were made.  

## 2013-03-30 NOTE — Assessment & Plan Note (Signed)
He stopped smoking back in may 2014

## 2013-03-30 NOTE — Patient Instructions (Addendum)
You are doing well. No medication changes were made.  We will schedule you for a carotid ultrasound for bruit, both sides  Please call us if you have new issues that need to be addressed before your next appt.  Your physician wants you to follow-up in: 12 months.  You will receive a reminder letter in the mail two months in advance. If you don't receive a letter, please call our office to schedule the follow-up appointment.

## 2013-03-30 NOTE — Progress Notes (Signed)
Patient ID: Thomas Sexton, male    DOB: [birth year], 72 y.o.   MRN: [id number]  HPI Comments: Thomas Sexton is a pleasant 72 year old gentleman who presented to [facility] on Sep 30 2012 with chest pain, found to have significant EKG changes concerning for STEMI, transferred to [facility], cardiac catheterization Sep 30, 2012 with bare-metal stent placed to his OM1. Also found to have 40% mid and 30% distal LAD disease, 50% diagonal disease, 30% RCA disease in the mid region with 30% proximal PDA disease. Troponin was greater than 20. Echocardiogram 10/01/2012 showing normal ejection fraction estimated at 55-60% with no notable wall motion abnormality.  In followup today, he reports that he is doing well. He walks 1/4 mile per day up and down hills to get his mail with no symptoms. He denies any significant shortness of breath, no chest pain or lightheadedness. He feels well, has started his activities/ADLs.  He has been compliant with his medications, taking aspirin and brilinta twice a day. Overall he has no complaints.  Laboratory the hospital showed creatinine 1.9, AST 164 with ALT 41, BNP at that time was 13,000, troponin 24  EKG from [facility] showed lateral ST elevation in V4 through V6 EKG today shows normal sinus rhythm with rate 80 beats per minute, T-wave abnormality in V5, V6, V2, 3, aVF (much improved from prior EKG)   Outpatient Encounter Prescriptions as of 03/30/2013  Medication Sig  . aspirin EC 81 MG EC tablet Take 1 tablet (81 mg total) by mouth daily.  Marland Kitchen atorvastatin (LIPITOR) 80 MG tablet Take 1 tablet (80 mg total) by mouth daily at 6 PM.  . cholecalciferol (VITAMIN D) 1000 UNITS tablet Take 1,000 Units by mouth daily.  . metoprolol tartrate (LOPRESSOR) 25 MG tablet Take 1 tablet (25 mg total) by mouth 2 (two) times daily.  . nitroGLYCERIN (NITROSTAT) 0.4 MG SL tablet Place 1 tablet (0.4 mg total) under the tongue every 5 (five) minutes x 3 doses as needed for chest pain.   . Ticagrelor (BRILINTA) 90 MG TABS tablet Take 1 tablet (90 mg total) by mouth 2 (two) times daily.  . vitamin A 40981 UNIT capsule Take 10,000 Units by mouth daily.  . vitamin B-12 (CYANOCOBALAMIN) 1000 MCG tablet Take 1,000 mcg by mouth daily.     Review of Systems  Constitutional: Negative.   HENT: Negative.   Eyes: Negative.   Respiratory: Negative.   Cardiovascular: Negative.   Gastrointestinal: Negative.   Endocrine: Negative.   Musculoskeletal: Negative.   Skin: Negative.   Allergic/Immunologic: Negative.   Neurological: Negative.   Hematological: Negative.   Psychiatric/Behavioral: Negative.   All other systems reviewed and are negative.    BP 120/78  Pulse 80  Ht 5\' 7"  (1.702 m)  Wt 142 lb (64.411 kg)  BMI 22.24 kg/m2  Physical Exam  Nursing note and vitals reviewed. Constitutional: He is oriented to person, place, and time. He appears well-developed and well-nourished.  HENT:  Head: Normocephalic.  Nose: Nose normal.  Mouth/Throat: Oropharynx is clear and moist.  Eyes: Conjunctivae are normal. Pupils are equal, round, and reactive to light.  Neck: Normal range of motion. Neck supple. No JVD present.  Cardiovascular: Normal rate, regular rhythm, S1 normal, S2 normal, normal heart sounds and intact distal pulses.  Exam reveals no gallop and no friction rub.   No murmur heard. Pulmonary/Chest: Effort normal and breath sounds normal. No respiratory distress. He has no wheezes. He has no rales. He exhibits no tenderness.  Abdominal: Soft. Bowel sounds are normal. He exhibits no distension. There is no tenderness.  Musculoskeletal: Normal range of motion. He exhibits no edema and no tenderness.  Lymphadenopathy:    He has no cervical adenopathy.  Neurological: He is alert and oriented to person, place, and time. Coordination normal.  Skin: Skin is warm and dry. No rash noted. No erythema.  Psychiatric: He has a normal mood and affect. His behavior is normal.  Judgment and thought content normal.      Assessment and Plan

## 2013-03-31 ENCOUNTER — Encounter (INDEPENDENT_AMBULATORY_CARE_PROVIDER_SITE_OTHER): Payer: PRIVATE HEALTH INSURANCE

## 2013-03-31 DIAGNOSIS — I6529 Occlusion and stenosis of unspecified carotid artery: Secondary | ICD-10-CM

## 2013-03-31 DIAGNOSIS — R0989 Other specified symptoms and signs involving the circulatory and respiratory systems: Secondary | ICD-10-CM

## 2013-08-25 ENCOUNTER — Telehealth: Payer: Self-pay | Admitting: *Deleted

## 2013-08-25 NOTE — Telephone Encounter (Signed)
Needs cardiac clearance and instructions on brilenta prior to colonoscopy.  Having [facility], please call Noreene LarssonJill at [phone]

## 2013-08-25 NOTE — Telephone Encounter (Signed)
Ok to hold brilinta  For 5 days before colo Stay on asa Acceptable risk Would explain to patient that there is some small risk coming off his thinners of stent thrombosis Risk is low

## 2013-08-26 NOTE — Telephone Encounter (Signed)
Left message for pt to call back  °

## 2013-09-02 NOTE — Telephone Encounter (Signed)
Left detailed message on pt's machine. Asked him to call w/ any questions or concerns.

## 2013-09-04 DIAGNOSIS — M25539 Pain in unspecified wrist: Secondary | ICD-10-CM | POA: Insufficient documentation

## 2013-10-27 ENCOUNTER — Other Ambulatory Visit (HOSPITAL_COMMUNITY): Payer: Self-pay | Admitting: *Deleted

## 2013-10-27 DIAGNOSIS — I6529 Occlusion and stenosis of unspecified carotid artery: Secondary | ICD-10-CM

## 2013-10-27 DIAGNOSIS — M109 Gout, unspecified: Secondary | ICD-10-CM | POA: Insufficient documentation

## 2013-11-10 ENCOUNTER — Encounter (INDEPENDENT_AMBULATORY_CARE_PROVIDER_SITE_OTHER): Payer: PRIVATE HEALTH INSURANCE

## 2013-11-10 DIAGNOSIS — I6529 Occlusion and stenosis of unspecified carotid artery: Secondary | ICD-10-CM

## 2014-01-01 IMAGING — CR DG CHEST 1V PORT
1 series · 1 of 1 positions shown · non-contrast
Comparison: none

REASON FOR EXAM: chest pain
COMMENTS:

PROCEDURE:     DXR - DXR PORTABLE CHEST SINGLE VIEW  - September 30, 2012  [DATE]
RESULT:     Comparison: None.

[ap]
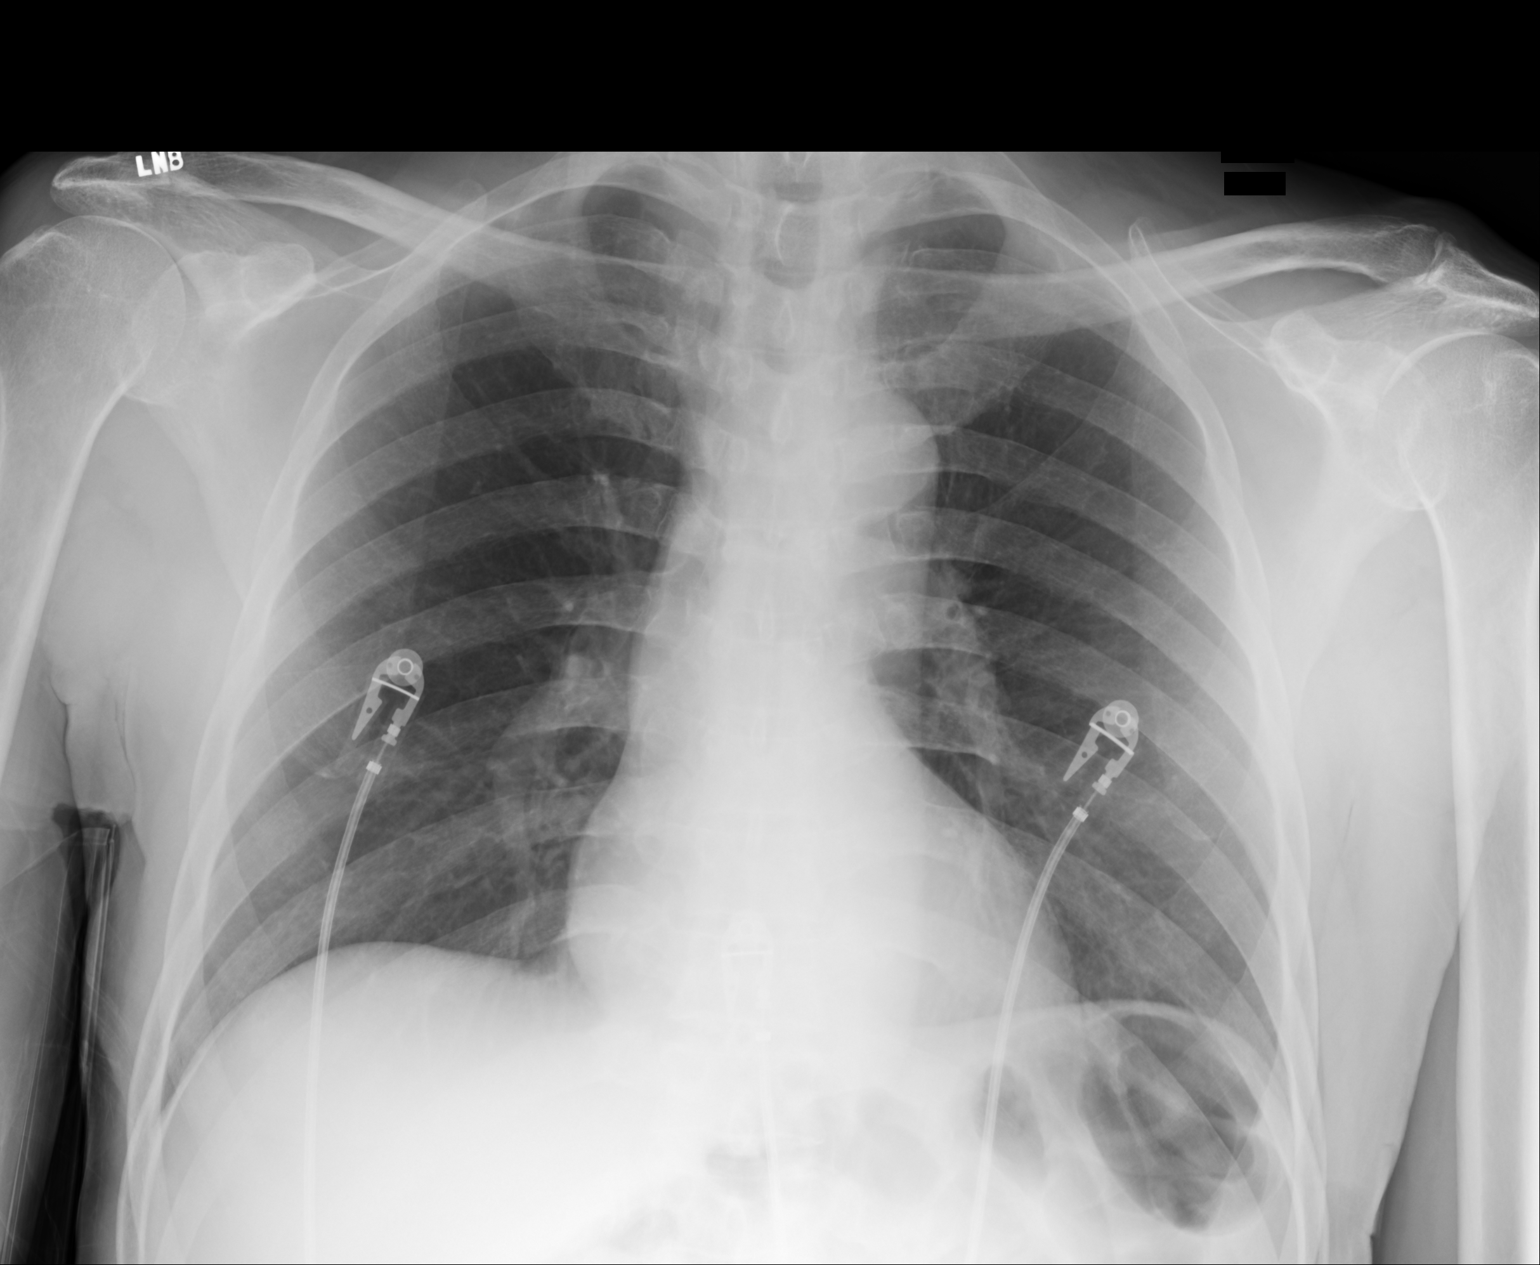

[1 of 1 positions shown; findings below may reference images not displayed]

FINDINGS: The heart and mediastinum are within normal limits. No focal pulmonary
opacities.
IMPRESSION: No acute cardiopulmonary disease.

## 2014-03-23 ENCOUNTER — Other Ambulatory Visit: Payer: Self-pay | Admitting: Cardiovascular Disease

## 2014-04-22 ENCOUNTER — Encounter (HOSPITAL_COMMUNITY): Payer: Self-pay | Admitting: Cardiovascular Disease

## 2014-04-27 ENCOUNTER — Other Ambulatory Visit: Payer: Self-pay | Admitting: Cardiovascular Disease

## 2014-05-31 ENCOUNTER — Encounter: Payer: Self-pay | Admitting: Cardiovascular Disease

## 2014-05-31 ENCOUNTER — Other Ambulatory Visit: Payer: Self-pay | Admitting: Radiology

## 2014-05-31 ENCOUNTER — Encounter (INDEPENDENT_AMBULATORY_CARE_PROVIDER_SITE_OTHER): Payer: Medicare Other

## 2014-05-31 ENCOUNTER — Ambulatory Visit (INDEPENDENT_AMBULATORY_CARE_PROVIDER_SITE_OTHER): Payer: Medicare Other | Admitting: Cardiovascular Disease

## 2014-05-31 VITALS — BP 134/64 | HR 63 | Ht 68.0 in | Wt 145.2 lb

## 2014-05-31 DIAGNOSIS — I6523 Occlusion and stenosis of bilateral carotid arteries: Secondary | ICD-10-CM

## 2014-05-31 DIAGNOSIS — I2510 Atherosclerotic heart disease of native coronary artery without angina pectoris: Secondary | ICD-10-CM

## 2014-05-31 DIAGNOSIS — E785 Hyperlipidemia, unspecified: Secondary | ICD-10-CM

## 2014-05-31 DIAGNOSIS — I10 Essential (primary) hypertension: Secondary | ICD-10-CM

## 2014-05-31 DIAGNOSIS — Z720 Tobacco use: Secondary | ICD-10-CM

## 2014-05-31 MED ORDER — TICAGRELOR 60 MG PO TABS
60.0000 mg | ORAL_TABLET | Freq: Two times a day (BID) | ORAL | Status: DC
Start: 2014-05-31 — End: 2015-05-31

## 2014-05-31 NOTE — Assessment & Plan Note (Signed)
He reports that he stop smoking after his heart attack. Encouraged continued smoking cessation

## 2014-05-31 NOTE — Assessment & Plan Note (Signed)
Currently with no symptoms of angina. No further workup at this time. We have recommended that he decrease the dose of his brilinta down to 60 mg twice a day

## 2014-05-31 NOTE — Patient Instructions (Addendum)
Please decrease the brilinta down to 60 mg twice a day With aspirin 81 mg daily  We will call you with the carotid results  Please call us if you have new issues that need to be addressed before your next appt.  Your physician wants you to follow-up in: 12 months.  You will receive a reminder letter in the mail two months in advance. If you don't receive a letter, please call our office to schedule the follow-up appointment.

## 2014-05-31 NOTE — Assessment & Plan Note (Signed)
Blood pressure is well controlled on today's visit. No changes made to the medications. 

## 2014-05-31 NOTE — Progress Notes (Signed)
Patient ID: Toniann KetRobert W Gowens, male    DOB: 02-26-1941, 74 y.o.   MRN: [id number]  HPI Comments: [name] is a pleasant 74 year old gentleman who [facility] on Sep 30 2012 with chest pain, found to have significant EKG changes concerning for STEMI, [facility], cardiac catheterization Sep 30, 2012 with bare-metal stent placed to his OM1. Also found to have 40% mid and 30% distal LAD disease, 50% diagonal disease, 30% RCA disease in the mid region with 30% proximal PDA disease. Troponin was greater than 20. Echocardiogram 10/01/2012 showing normal ejection fraction estimated at 55-60% with no notable wall motion abnormality. 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 He feels well, has started his activities/ADLs.  He has been compliant with his medications, taking aspirin and brilinta twice a day. Overall he has no complaints.  EKG done on today's visit shows normal sinus rhythm with rate 63 bpm, no significant ST or T-wave changes  Laboratory the hospital showed creatinine 1.9, AST 164 with ALT 41, BNP at that time was 13,000, troponin 24  No Known Allergies  Outpatient Encounter Prescriptions as of 05/31/2014  Medication Sig  . aspirin EC 81 MG EC tablet Take 1 tablet (81 mg total) by mouth daily.  Marland Kitchen. atorvastatin (LIPITOR) 80 MG tablet TAKE 1 TABLET BY MOUTH EVERY DAY AT 6 PM  . cholecalciferol (VITAMIN D) 1000 UNITS tablet Take 1,000 Units by mouth daily.  . metoprolol tartrate (LOPRESSOR) 25 MG tablet TAKE 1 TABLET BY MOUTH TWICE DAILY  . nitroGLYCERIN (NITROSTAT) 0.4 MG SL tablet Place 1 tablet (0.4 mg total) under the tongue every 5 (five) minutes x 3 doses as needed for chest pain.  . ticagrelor (BRILINTA) 60 MG TABS  tablet Take 1 tablet (60 mg total) by mouth 2 (two) times daily.  . vitamin A 1914710000 UNIT capsule Take 10,000 Units by mouth daily.  . vitamin B-12 (CYANOCOBALAMIN) 1000 MCG tablet Take 1,000 mcg by mouth daily.  . [DISCONTINUED] Ticagrelor (BRILINTA) 90 MG TABS tablet Take 1 tablet (90 mg total) by mouth 2 (two) times daily.    Past Medical History  Diagnosis Date  . HTN (hypertension)   . Tobacco abuse   . Hyperlipidemia   . CAD (coronary artery disease)     a. 09/2012 Acute STEMI/Cath/PCI: LM nl, LAD 5034m, 30d, D1 50, LCX mild prox plaque, OM1 100 (2.5x16 Promus Premier DES), RCA dom, 2363m, PDA 30p;  b. 09/2012 Echo: EF 55-60%, PASP 32mmHg.    Past Surgical History  Procedure Laterality Date  . Cardiac catheterization    . Coronary angioplasty  09/2012    a. 09/2012 Acute STEMI/Cath/PCI: LM nl, LAD 9934m, 30d, D1 50, LCX mild prox plaque, OM1 100 (2.5x16 Promus Premier DES), RCA dom, 5363m, PDA 30p;  b. 09/2012 Echo: EF 55-60%, PASP 32mmHg.  Marland Kitchen. Left heart catheterization with coronary angiogram N/A 09/30/2012    Procedure: STEMI;  Surgeon: Kathleene Hazelhristopher D McAlhany, MD;  [facility];  Service: Cardiovascular;  Laterality: N/A;  . Left heart cath  09/30/2012    Procedure: LEFT HEART CATH;  Surgeon: Kathleene Hazelhristopher D McAlhany, MD;  [facility];  Service: Cardiovascular;;  . Percutaneous coronary stent intervention (pci-s)  09/30/2012    Procedure: PERCUTANEOUS CORONARY STENT INTERVENTION (  PCI-S);  Surgeon: Kathleene Hazel, MD;  Location: [facility] CATH LAB;  Service: Cardiovascular;;    Social History  reports that he quit smoking about 20 months ago. His smoking use included Cigarettes. He has a 25 pack-year smoking history. He does not have any smokeless tobacco history on file. He reports that he does not drink alcohol or use illicit drugs.  Family History family history includes Heart attack in his mother; Hyperlipidemia in his father; Hypertension in his father.  Review of  Systems  Constitutional: Negative.   Respiratory: Negative.   Cardiovascular: Negative.   Gastrointestinal: Negative.   Musculoskeletal: Negative.   Neurological: Negative.   Hematological: Negative.   Psychiatric/Behavioral: Negative.   All other systems reviewed and are negative.   BP 134/64 mmHg  Pulse 63  Ht  (1.727 m)  Wt 145 lb 4 oz (65.885 kg)  BMI 22.09 kg/m2  Physical Exam  Constitutional: He is oriented to person, place, and time. He appears well-developed and well-nourished.  HENT:  Head: Normocephalic.  Nose: Nose normal.  Mouth/Throat: Oropharynx is clear and moist.  Eyes: Conjunctivae are normal. Pupils are equal, round, and reactive to light.  Neck: Normal range of motion. Neck supple. No JVD present.  Cardiovascular: Normal rate, regular rhythm, S1 normal, S2 normal, normal heart sounds and intact distal pulses.  Exam reveals no gallop and no friction rub.   No murmur heard. Pulmonary/Chest: Effort normal and breath sounds normal. No respiratory distress. He has no wheezes. He has no rales. He exhibits no tenderness.  Abdominal: Soft. Bowel sounds are normal. He exhibits no distension. There is no tenderness.  Musculoskeletal: Normal range of motion. He exhibits no edema or tenderness.  Lymphadenopathy:    He has no cervical adenopathy.  Neurological: He is alert and oriented to person, place, and time. Coordination normal.  Skin: Skin is warm and dry. No rash noted. No erythema.  Psychiatric: He has a normal mood and affect. His behavior is normal. Judgment and thought content normal.      Assessment and Plan   Nursing note and vitals reviewed.

## 2014-05-31 NOTE — Assessment & Plan Note (Signed)
Encouraged him to stay on his Lipitor, goal LDL less than 70 

## 2014-11-12 ENCOUNTER — Other Ambulatory Visit: Payer: Self-pay | Admitting: Cardiovascular Disease

## 2015-01-03 ENCOUNTER — Other Ambulatory Visit: Payer: Self-pay | Admitting: Cardiovascular Disease

## 2015-01-03 DIAGNOSIS — I6523 Occlusion and stenosis of bilateral carotid arteries: Secondary | ICD-10-CM

## 2015-01-06 ENCOUNTER — Ambulatory Visit (INDEPENDENT_AMBULATORY_CARE_PROVIDER_SITE_OTHER): Payer: Medicare Other

## 2015-01-06 DIAGNOSIS — I6523 Occlusion and stenosis of bilateral carotid arteries: Secondary | ICD-10-CM

## 2015-01-10 ENCOUNTER — Other Ambulatory Visit: Payer: Self-pay

## 2015-01-10 DIAGNOSIS — I6523 Occlusion and stenosis of bilateral carotid arteries: Secondary | ICD-10-CM

## 2015-05-31 ENCOUNTER — Ambulatory Visit (INDEPENDENT_AMBULATORY_CARE_PROVIDER_SITE_OTHER): Payer: Medicare Other | Admitting: Cardiovascular Disease

## 2015-05-31 ENCOUNTER — Encounter: Payer: Self-pay | Admitting: Cardiovascular Disease

## 2015-05-31 VITALS — BP 146/90 | Ht 67.0 in | Wt 145.2 lb

## 2015-05-31 DIAGNOSIS — I2129 ST elevation (STEMI) myocardial infarction involving other sites: Secondary | ICD-10-CM

## 2015-05-31 DIAGNOSIS — I10 Essential (primary) hypertension: Secondary | ICD-10-CM

## 2015-05-31 DIAGNOSIS — E785 Hyperlipidemia, unspecified: Secondary | ICD-10-CM

## 2015-05-31 DIAGNOSIS — I2510 Atherosclerotic heart disease of native coronary artery without angina pectoris: Secondary | ICD-10-CM

## 2015-05-31 DIAGNOSIS — I6523 Occlusion and stenosis of bilateral carotid arteries: Secondary | ICD-10-CM

## 2015-05-31 DIAGNOSIS — I6529 Occlusion and stenosis of unspecified carotid artery: Secondary | ICD-10-CM | POA: Insufficient documentation

## 2015-05-31 MED ORDER — ATORVASTATIN CALCIUM 80 MG PO TABS: ORAL_TABLET | ORAL | Status: DC

## 2015-05-31 MED ORDER — TICAGRELOR 60 MG PO TABS: 60.0000 mg | ORAL_TABLET | Freq: Two times a day (BID) | ORAL | Status: DC

## 2015-05-31 MED ORDER — METOPROLOL TARTRATE 25 MG PO TABS: 25.0000 mg | ORAL_TABLET | Freq: Two times a day (BID) | ORAL | Status: DC

## 2015-05-31 NOTE — Assessment & Plan Note (Signed)
Blood pressure is well controlled on today's visit. No changes made to the medications. 

## 2015-05-31 NOTE — Assessment & Plan Note (Signed)
We have ordered lipid panel, LFTs today Goal LDL less than 70

## 2015-05-31 NOTE — Assessment & Plan Note (Signed)
60-79% bilateral carotid stenosis Repeat ultrasound ordered

## 2015-05-31 NOTE — Progress Notes (Signed)
Patient ID: Thomas Sexton, male    DOB: 31-May-1940, 75 y.o.   MRN: [id number]  HPI Comments: Thomas Sexton is a pleasant 75 year old gentleman who presented to [facility] on Sep 30 2012 with chest pain, found to have significant EKG changes concerning for STEMI, transferred to [facility], cardiac catheterization Sep 30, 2012 with bare-metal stent placed to his OM1. Also found to have 40% mid and 30% distal LAD disease, 50% diagonal disease, 30% RCA disease in the mid region with 30% proximal PDA disease. Troponin was greater than 20. Echocardiogram 10/01/2012 showing normal ejection fraction estimated at 55-60% with no notable wall motion abnormality. 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 No recent lab work available for review, no recent cholesterol panel Needs refills on his medications. He is retired, stays active, exercises regularly He has been compliant with his medications, taking aspirin and brilinta twice a day. Overall he has no complaints.  EKG done on today's visit shows normal sinus rhythm with rate 75 bpm, no significant ST or T-wave changes, PVC noted    No Known Allergies  Outpatient Encounter Prescriptions as of 05/31/2015  Medication Sig  . aspirin EC 81 MG EC tablet Take 1 tablet (81 mg total) by mouth daily.  Marland Kitchen atorvastatin (LIPITOR) 80 MG tablet TAKE 1 TABLET BY MOUTH DAILY AT 6:00 PM  . cholecalciferol (VITAMIN D) 1000 UNITS tablet Take 1,000 Units by mouth daily.  . metoprolol tartrate (LOPRESSOR) 25 MG tablet Take 1 tablet (25 mg total) by mouth 2 (two) times daily.  . nitroGLYCERIN (NITROSTAT) 0.4 MG SL tablet Place 1 tablet (0.4 mg total) under the tongue every 5 (five) minutes x 3 doses as needed for chest pain.  . ticagrelor (BRILINTA) 60 MG TABS tablet Take 1 tablet (60 mg total) by mouth 2  (two) times daily.  . vitamin A 40981 UNIT capsule Take 10,000 Units by mouth daily.  . vitamin B-12 (CYANOCOBALAMIN) 1000 MCG tablet Take 1,000 mcg by mouth daily.  . [DISCONTINUED] atorvastatin (LIPITOR) 80 MG tablet TAKE 1 TABLET BY MOUTH DAILY AT 6:00 PM  . [DISCONTINUED] metoprolol tartrate (LOPRESSOR) 25 MG tablet TAKE 1 TABLET BY MOUTH TWICE DAILY  . [DISCONTINUED] ticagrelor (BRILINTA) 60 MG TABS tablet Take 1 tablet (60 mg total) by mouth 2 (two) times daily.   No facility-administered encounter medications on file as of 05/31/2015.    Past Medical History  Diagnosis Date  . HTN (hypertension)   . Tobacco abuse   . Hyperlipidemia   . CAD (coronary artery disease)     a. 09/2012 Acute STEMI/Cath/PCI: LM nl, LAD 15m, 30d, D1 50, LCX mild prox plaque, OM1 100 (2.5x16 Promus Premier DES), RCA dom, 77m, PDA 30p;  b. 09/2012 Echo: EF 55-60%, PASP .    Past Surgical History  Procedure Laterality Date  . Cardiac catheterization    . Coronary angioplasty  09/2012    a. 09/2012 Acute STEMI/Cath/PCI: LM nl, LAD 37m, 30d, D1 50, LCX mild prox plaque, OM1 100 (2.5x16 Promus Premier DES), RCA dom, 43m, PDA 30p;  b. 09/2012 Echo: EF 55-60%, PASP .  Marland Kitchen Left heart catheterization with coronary angiogram N/A 09/30/2012    Procedure: STEMI;  Surgeon: Kathleene Hazel, MD;  Location[facility] CATH LAB;  Service: Cardiovascular;  Laterality: N/A;  . Left heart cath  09/30/2012  Procedure: LEFT HEART CATH;  Surgeon: Kathleene Hazel, MD;  Location: [facility] CATH LAB;  Service: Cardiovascular;;  . Percutaneous coronary stent intervention (pci-s)  09/30/2012    Procedure: PERCUTANEOUS CORONARY STENT INTERVENTION (PCI-S);  Surgeon: Kathleene Hazel, MD;  Location: [facility] CATH LAB;  Service: Cardiovascular;;    Social History  reports that he quit smoking about 2 years ago. His smoking use included Cigarettes. He has a 25 pack-year smoking history. He does not have any smokeless tobacco history  on file. He reports that he does not drink alcohol or use illicit drugs.  Family History family history includes Heart attack in his mother; Hyperlipidemia in his father; Hypertension in his father.  Review of Systems  Constitutional: Negative.   Respiratory: Negative.   Cardiovascular: Negative.   Gastrointestinal: Negative.   Musculoskeletal: Negative.   Neurological: Negative.   Hematological: Negative.   Psychiatric/Behavioral: Negative.   All other systems reviewed and are negative.   BP 146/90 mmHg  Ht  (1.702 m)  Wt 145 lb 4 oz (65.885 kg)  BMI 22.74 kg/m2  Physical Exam  Constitutional: He is oriented to person, place, and time. He appears well-developed and well-nourished.  HENT:  Head: Normocephalic.  Nose: Nose normal.  Mouth/Throat: Oropharynx is clear and moist.  Eyes: Conjunctivae are normal. Pupils are equal, round, and reactive to light.  Neck: Normal range of motion. Neck supple. No JVD present.  Cardiovascular: Normal rate, regular rhythm, S1 normal, S2 normal, normal heart sounds and intact distal pulses.  Exam reveals no gallop and no friction rub.   No murmur heard. Pulmonary/Chest: Effort normal and breath sounds normal. No respiratory distress. He has no wheezes. He has no rales. He exhibits no tenderness.  Abdominal: Soft. Bowel sounds are normal. He exhibits no distension. There is no tenderness.  Musculoskeletal: Normal range of motion. He exhibits no edema or tenderness.  Lymphadenopathy:    He has no cervical adenopathy.  Neurological: He is alert and oriented to person, place, and time. Coordination normal.  Skin: Skin is warm and dry. No rash noted. No erythema.  Psychiatric: He has a normal mood and affect. His behavior is normal. Judgment and thought content normal.      Assessment and Plan   Nursing note and vitals reviewed.

## 2015-05-31 NOTE — Assessment & Plan Note (Signed)
Currently with no symptoms of angina. No further workup at this time. Continue current medication regimen. 

## 2015-05-31 NOTE — Patient Instructions (Addendum)
You are doing well. No medication changes were made.  We will repeat a carotid ultrasound for known carotid stenosis  Please stay on your current medications  Lab work today, liver and lipids  Please call us if you have new issues that need to be addressed before your next appt.  Your physician wants you to follow-up in: 12 months.  You will receive a reminder letter in the mail two months in advance. If you don't receive a letter, please call our office to schedule the follow-up appointment.   Your physician has requested that you have a carotid duplex. This test is an ultrasound of the carotid arteries in your neck. It looks at blood flow through these arteries that supply the brain with blood. Allow one hour for this exam. There are no restrictions or special instructions.  Date & time: ________________________________ a

## 2015-06-01 LAB — HEPATIC FUNCTION PANEL
ALK PHOS: 74 IU/L (ref 39–117)
ALT: 12 IU/L (ref 0–44)
AST: 19 IU/L (ref 0–40)
Albumin: 4.6 g/dL (ref 3.5–4.8)
BILIRUBIN, DIRECT: 0.16 mg/dL (ref 0.00–0.40)
Bilirubin Total: 0.6 mg/dL (ref 0.0–1.2)
TOTAL PROTEIN: 6.9 g/dL (ref 6.0–8.5)

## 2015-06-01 LAB — LIPID PANEL
CHOL/HDL RATIO: 2.2 ratio (ref 0.0–5.0)
CHOLESTEROL TOTAL: 94 mg/dL — AB (ref 100–199)
HDL: 42 mg/dL (ref >39)
LDL CALC: 37 mg/dL (ref 0–99)
TRIGLYCERIDES: 75 mg/dL (ref 0–149)
VLDL CHOLESTEROL CAL: 15 mg/dL (ref 5–40)

## 2015-06-02 ENCOUNTER — Other Ambulatory Visit: Payer: Self-pay | Admitting: Cardiovascular Disease

## 2015-06-02 DIAGNOSIS — I6523 Occlusion and stenosis of bilateral carotid arteries: Secondary | ICD-10-CM

## 2015-06-02 DIAGNOSIS — I10 Essential (primary) hypertension: Secondary | ICD-10-CM

## 2015-06-06 ENCOUNTER — Ambulatory Visit: Payer: Medicare Other

## 2015-06-06 DIAGNOSIS — I10 Essential (primary) hypertension: Secondary | ICD-10-CM

## 2015-06-06 DIAGNOSIS — I6523 Occlusion and stenosis of bilateral carotid arteries: Secondary | ICD-10-CM

## 2015-11-29 ENCOUNTER — Other Ambulatory Visit: Payer: Self-pay | Admitting: Cardiovascular Disease

## 2016-05-15 DIAGNOSIS — E119 Type 2 diabetes mellitus without complications: Secondary | ICD-10-CM | POA: Insufficient documentation

## 2016-05-31 ENCOUNTER — Ambulatory Visit: Payer: Medicare Other | Admitting: Cardiovascular Disease

## 2016-06-04 ENCOUNTER — Other Ambulatory Visit: Payer: Self-pay | Admitting: Cardiovascular Disease

## 2016-06-04 ENCOUNTER — Other Ambulatory Visit: Payer: Self-pay

## 2016-06-04 ENCOUNTER — Ambulatory Visit (INDEPENDENT_AMBULATORY_CARE_PROVIDER_SITE_OTHER): Payer: Medicare Other | Admitting: Cardiovascular Disease

## 2016-06-04 VITALS — BP 140/72 | HR 69

## 2016-06-04 DIAGNOSIS — I6523 Occlusion and stenosis of bilateral carotid arteries: Secondary | ICD-10-CM

## 2016-06-04 DIAGNOSIS — Z720 Tobacco use: Secondary | ICD-10-CM | POA: Diagnosis not present

## 2016-06-04 DIAGNOSIS — I10 Essential (primary) hypertension: Secondary | ICD-10-CM | POA: Diagnosis not present

## 2016-06-04 DIAGNOSIS — I6529 Occlusion and stenosis of unspecified carotid artery: Secondary | ICD-10-CM

## 2016-06-04 DIAGNOSIS — I2510 Atherosclerotic heart disease of native coronary artery without angina pectoris: Secondary | ICD-10-CM

## 2016-06-04 DIAGNOSIS — E1159 Type 2 diabetes mellitus with other circulatory complications: Secondary | ICD-10-CM

## 2016-06-04 NOTE — Patient Instructions (Signed)

## 2016-06-04 NOTE — Progress Notes (Signed)
Cardiology Office Note  Date:  06/04/2016   ID:  Thomas HausenRobert W Sexton, DOB Sep 22, 1940, MRN [id number]  PCP:  Thomas CitronHeard, William U, MD   Chief Complaint  Patient presents with  . other    59mo f/[place]on. Pt states he is doing well. Reviewed meds with pt verbally.    HPI:  [name] is a pleasant 76 year old gentleman who presented to [facility] on Sep 30 2012 with chest pain, found to have significant EKG changes concerning for STEMI, transferred [facility], cardiac catheterization Sep 30, 2012 with bare-metal stent placed to his OM1. Also found to have 40% mid and 30% distal LAD disease, 50% diagonal disease, 30% RCA disease in the mid region with 30% proximal PDA disease. Troponin was greater than 20. Echocardiogram 10/01/2012 showing normal ejection fraction estimated at 55-60% with no notable wall motion abnormality. He presents today for follow-up of his coronary artery disease Carotid ultrasound showing Essentially stable 60 to 79% b/l disease 05/2015 Sees [facility] PMD  In follow-up he reports that he is doing well, Denies any chest pain concerning for angina Reports he now has type 2 diabetes, is taking metformin 500 mg twice a day  Lab work reviewed with him total chol 94, LDL 37 HBA1C 6.3  retired, stays active, exercises regularly Walks a lot on his own He has been compliant with his medications, Overall he has no complaints.  EKG done on today's visit shows normal sinus rhythm with rate 69 bpm, no significant ST or T-wave changes, APCs noted    PMH:   has a past medical history of CAD (coronary artery disease); HTN (hypertension); Hyperlipidemia; and Tobacco abuse.  PSH:    Past Surgical History:  Procedure Laterality Date  . CARDIAC CATHETERIZATION    . CORONARY ANGIOPLASTY  09/2012   a. 09/2012 Acute STEMI/Cath/PCI: LM nl, LAD 7449m, 30d, D1 50, LCX mild prox plaque, OM1 100 (2.5x16 Promus Premier DES), RCA dom, 6069m, PDA 30p;  b. 09/2012 Echo: EF 55-60%, PASP 32mmHg.  Marland Kitchen. LEFT  HEART CATH  09/30/2012   Procedure: LEFT HEART CATH;  Surgeon: Kathleene Hazelhristopher D McAlhany, MD;  [facility];  Service: Cardiovascular;;  . LEFT HEART CATHETERIZATION WITH CORONARY ANGIOGRAM N/A 09/30/2012   Procedure: STEMI;  Surgeon: Kathleene Hazelhristopher D McAlhany, MD;  [facility];  Service: Cardiovascular;  Laterality: N/A;  . PERCUTANEOUS CORONARY STENT INTERVENTION (PCI-S)  09/30/2012   Procedure: PERCUTANEOUS CORONARY STENT INTERVENTION (PCI-S);  Surgeon: Kathleene Hazelhristopher D McAlhany, MD;  [facility];  Service: Cardiovascular;;    Current Outpatient Prescriptions  Medication Sig Dispense Refill  . aspirin EC 81 MG EC tablet Take 1 tablet (81 mg total) by mouth daily.    Marland Kitchen. atorvastatin (LIPITOR) 80 MG tablet TAKE 1 TABLET BY MOUTH DAILY AT 6:00 PM 90 tablet 3  . cholecalciferol (VITAMIN D) 1000 UNITS tablet Take 1,000 Units by mouth daily.    . metoprolol tartrate (LOPRESSOR) 25 MG tablet Take 1 tablet (25 mg total) by mouth 2 (two) times daily. 180 tablet 3  . nitroGLYCERIN (NITROSTAT) 0.4 MG SL tablet Place 1 tablet (0.4 mg total) under the tongue every 5 (five) minutes x 3 doses as needed for chest pain. 25 tablet 3  . ticagrelor (BRILINTA) 60 MG TABS tablet Take 1 tablet (60 mg total) by mouth 2 (two) times daily. 180 tablet 3  . vitamin B-12 (CYANOCOBALAMIN) 1000 MCG tablet Take 1,000 mcg by mouth daily.     No current facility-administered medications for  this visit.      Allergies:   Patient has no known allergies.   Social History:  The patient  reports that he quit smoking about 3 years ago. His smoking use included Cigarettes. He has a 25.00 pack-year smoking history. He does not have any smokeless tobacco history on file. He reports that he does not drink alcohol or use drugs.   Family History:   family history includes Heart attack in his mother; Hyperlipidemia in his father; Hypertension in his father.    Review of Systems: Review of Systems  Constitutional:  Negative.   Respiratory: Negative.   Cardiovascular: Negative.   Gastrointestinal: Negative.   Musculoskeletal: Negative.   Neurological: Negative.   Psychiatric/Behavioral: Negative.   All other systems reviewed and are negative.    PHYSICAL EXAM: VS:  BP 140/72 (BP Location: Left Arm, Patient Position: Sitting, Cuff Size: Normal)   Pulse 69  , BMI There is no height or weight on file to calculate BMI. GEN: Well nourished, well developed, in no acute distress  HEENT: normal  Neck: no JVD, carotid bruits, or masses Cardiac: RRR; no murmurs, rubs, or gallops,no edema  Respiratory:  clear to auscultation bilaterally, normal work of breathing GI: soft, nontender, nondistended, + BS MS: no deformity or atrophy  Skin: warm and dry, no rash Neuro:  Strength and sensation are intact Psych: euthymic mood, full affect    Recent Labs: No results found for requested labs within last 8760 hours.    Lipid Panel Lab Results  Component Value Date   CHOL 94 (L) 05/31/2015   HDL 42 05/31/2015   LDLCALC 37 05/31/2015   TRIG 75 05/31/2015      Wt Readings from Last 3 Encounters:  05/31/15 145 lb 4 oz (65.9 kg)  05/31/14 145 lb 4 oz (65.9 kg)  03/30/13 142 lb (64.4 kg)       ASSESSMENT AND PLAN:  Atherosclerosis of native coronary artery without angina pectoris, unspecified whether native or transplanted heart - Plan: EKG 12-Lead Currently with no symptoms of angina. No further workup at this time. Continue current medication regimen.  Essential hypertension Blood pressure is well controlled on today's visit. No changes made to the medications.  Bilateral carotid artery stenosis Repeat carotid Sexton/s schedule  Tobacco abuse Stopped smoking, does not need assistance with smoking cessation  Type 2 diabetes, controlled Currently on metformin  Disposition:   F/[place]  6 months   Orders Placed This Encounter  Procedures  . EKG 12-Lead     Signed, Dossie Arbour, M.D.,  Ph.D. 06/04/2016  [facility] [place], [place] [phone]

## 2016-06-27 ENCOUNTER — Ambulatory Visit: Payer: Medicare Other

## 2016-06-27 DIAGNOSIS — I6523 Occlusion and stenosis of bilateral carotid arteries: Secondary | ICD-10-CM

## 2016-06-27 DIAGNOSIS — I6529 Occlusion and stenosis of unspecified carotid artery: Secondary | ICD-10-CM

## 2016-06-28 LAB — VAS US CAROTID
LCCADDIAS: -15 cm/s
LCCADSYS: -90 cm/s
LCCAPDIAS: 31 cm/s
LCCAPSYS: 158 cm/s
LEFT ECA DIAS: -46 cm/s
LEFT VERTEBRAL DIAS: 28 cm/s
LICADDIAS: -24 cm/s
LICADSYS: -68 cm/s
LICAPDIAS: 58 cm/s
LICAPSYS: 336 cm/s
RCCAPSYS: 112 cm/s
RIGHT ECA DIAS: -17 cm/s
RIGHT VERTEBRAL DIAS: -15 cm/s
Right CCA prox dias: 16 cm/s
Right cca dist sys: -110 cm/s

## 2016-11-29 DIAGNOSIS — R413 Other amnesia: Secondary | ICD-10-CM | POA: Insufficient documentation

## 2017-05-31 ENCOUNTER — Other Ambulatory Visit: Payer: Self-pay | Admitting: *Deleted

## 2017-05-31 DIAGNOSIS — I6529 Occlusion and stenosis of unspecified carotid artery: Secondary | ICD-10-CM

## 2017-06-06 ENCOUNTER — Other Ambulatory Visit: Payer: Self-pay | Admitting: Cardiovascular Disease

## 2017-06-24 ENCOUNTER — Other Ambulatory Visit: Payer: Self-pay | Admitting: Cardiovascular Disease

## 2017-06-24 ENCOUNTER — Encounter: Payer: Self-pay | Admitting: Cardiovascular Disease

## 2017-06-25 ENCOUNTER — Encounter: Payer: Self-pay | Admitting: Cardiovascular Disease

## 2017-06-25 ENCOUNTER — Telehealth: Payer: Self-pay | Admitting: Cardiovascular Disease

## 2017-06-25 NOTE — Telephone Encounter (Signed)
-----   Message from Margrett RudBrittany N Slayton, [place] sent at 06/24/2017 10:26 AM EST ----- Regarding: Patient needs an appointment  Can you please try to schedule an appointment with Dr. Mariah MillingGollan, Thank you!

## 2017-06-25 NOTE — Telephone Encounter (Signed)
Disconnected telephone number mailed letter

## 2017-07-02 ENCOUNTER — Other Ambulatory Visit: Payer: Self-pay

## 2017-07-02 MED ORDER — TICAGRELOR 60 MG PO TABS: 60.0000 mg | ORAL_TABLET | Freq: Two times a day (BID) | ORAL | 0 refills | Status: DC

## 2017-07-21 NOTE — Progress Notes (Signed)
Cardiology Office Note  Date:  07/23/2017   ID:  Thomas Sexton, DOB 05-Sep-1940, MRN [id number]  PCP:  Thomas Loft, MD   Chief Complaint  Patient presents with  . OTHER    12 month f/u no complaints today. Meds reviewed verbally with pt.    HPI:  Thomas Sexton is a pleasant 77 year old gentleman with PMH of  CAD [facility] on Sep 30 2012 with chest pain,  STEMI, cardiac catheterization Sep 30, 2012  bare-metal stent placed to his OM1. 40% mid and 30% distal LAD disease, 50% diagonal disease, 30% RCA disease in the mid region with 30% proximal PDA disease. Troponin was greater than 20. Echo 10/01/2012 showing normal ejection fraction55-60% Carotid ultrasound showing Essentially stable 60 to 79% b/l disease 05/2015 Sees [facility] PMD He presents today for follow-up of his coronary artery disease  Doing well in follow-up, Tired, no shortness of breath or chest discomfort on exertion Reports that sugars were good and he was taken off his metformin No regular exercise program but stays busy No near syncope, syncope, orthostasis PND, orthopnea Reports that he walks a lot  Lab work reviewed with him total chol 94, LDL 37 HBA1C 6.3  EKG personally reviewed by myself on todays visit Shows sinus bradycardia rate 59 bpm no significant ST or T wave changes    PMH:   has a past medical history of CAD (coronary artery disease), HTN (hypertension), Hyperlipidemia, and Tobacco abuse.  PSH:    Past Surgical History:  Procedure Laterality Date  . CARDIAC CATHETERIZATION    . CORONARY ANGIOPLASTY  09/2012   a. 09/2012 Acute STEMI/Cath/PCI: LM nl, LAD 51m, 30d, D1 50, LCX mild prox plaque, OM1 100 (2.5x16 Promus Premier DES), RCA dom, 61m, PDA 30p;  b. 09/2012 Echo: EF 55-60%, PASP .  Marland Kitchen LEFT HEART CATH  09/30/2012   Procedure: LEFT HEART CATH;  Surgeon: Kathleene Hazel, MD;  Location: [facility] CATH LAB;  Service: Cardiovascular;;  . LEFT HEART CATHETERIZATION WITH CORONARY ANGIOGRAM N/A  09/30/2012   Procedure: STEMI;  Surgeon: Kathleene Hazel, MD;  Location: [facility] CATH LAB;  Service: Cardiovascular;  Laterality: N/A;  . PERCUTANEOUS CORONARY STENT INTERVENTION (PCI-S)  09/30/2012   Procedure: PERCUTANEOUS CORONARY STENT INTERVENTION (PCI-S);  Surgeon: Kathleene Hazel, MD;  Location: [facility] CATH LAB;  Service: Cardiovascular;;    Current Outpatient Medications  Medication Sig Dispense Refill  . aspirin EC 81 MG EC tablet Take 1 tablet (81 mg total) by mouth daily.    Marland Kitchen atorvastatin (LIPITOR) 80 MG tablet TAKE 1 TABLET BY MOUTH EVERY DAY AT 6PM 90 tablet 3  . cholecalciferol (VITAMIN D) 1000 UNITS tablet Take 1,000 Units by mouth daily.    . metoprolol tartrate (LOPRESSOR) 25 MG tablet TAKE 1 TABLET(25 MG) BY MOUTH TWICE DAILY 180 tablet 3  . nitroGLYCERIN (NITROSTAT) 0.4 MG SL tablet Place 1 tablet (0.4 mg total) under the tongue every 5 (five) minutes x 3 doses as needed for chest pain. 25 tablet 3  . ticagrelor (BRILINTA) 60 MG TABS tablet Take 1 tablet (60 mg total) by mouth 2 (two) times daily. 180 tablet 3  . vitamin B-12 (CYANOCOBALAMIN) 1000 MCG tablet Take 1,000 mcg by mouth daily.     No current facility-administered medications for this visit.      Allergies:   Patient has no known allergies.   Social History:  The patient  reports that he quit smoking about 4 years ago. His smoking use included cigarettes. He has  a 25.00 pack-year smoking history. He does not have any smokeless tobacco history on file. He reports that he does not drink alcohol or use drugs.   Family History:   family history includes Heart attack in his mother; Hyperlipidemia in his father; Hypertension in his father.    Review of Systems: Review of Systems  Constitutional: Negative.   Respiratory: Negative.   Cardiovascular: Negative.   Gastrointestinal: Negative.   Musculoskeletal: Negative.   Neurological: Negative.   Psychiatric/Behavioral: Negative.   All other systems  reviewed and are negative.    PHYSICAL EXAM: VS:  BP (!) 142/70 (BP Location: Left Arm, Patient Position: Sitting, Cuff Size: Normal)   Pulse (!) 59   Ht 5\' 6"  (1.676 m)   Wt 142 lb 8 oz (64.6 kg)   BMI 23.00 kg/m  , BMI Body mass index is 23 kg/m. Constitutional:  oriented to person, place, and time. No distress.  HENT:  Head: Normocephalic and atraumatic.  Eyes:  no discharge. No scleral icterus.  Neck: Normal range of motion. Neck supple. No JVD present.  2+ b/l bruits Cardiovascular: Normal rate, regular rhythm, normal heart sounds and intact distal pulses. Exam reveals no gallop and no friction rub. No edema No murmur heard. Pulmonary/Chest: Effort normal and breath sounds normal. No stridor. No respiratory distress.  no wheezes.  no rales.  no tenderness.  Abdominal: Soft.  no distension.  no tenderness.  Musculoskeletal: Normal range of motion.  no  tenderness or deformity.  Neurological:  normal muscle tone. Coordination normal. No atrophy Skin: Skin is warm and dry. No rash noted. not diaphoretic.  Psychiatric:  normal mood and affect. behavior is normal. Thought content normal.   Recent Labs: No results found for requested labs within last 8760 hours.    Lipid Panel Lab Results  Component Value Date   CHOL 94 (L) 05/31/2015   HDL 42 05/31/2015   LDLCALC 37 05/31/2015   TRIG 75 05/31/2015      Wt Readings from Last 3 Encounters:  07/23/17 142 lb 8 oz (64.6 kg)  05/31/15 145 lb 4 oz (65.9 kg)  05/31/14 145 lb 4 oz (65.9 kg)       ASSESSMENT AND PLAN:  Atherosclerosis of native coronary artery without angina pectoris, unspecified whether native or transplanted heart  Currently with no symptoms of angina. No further workup at this time. Continue current medication regimen. Stable  Essential hypertension Blood pressure is well controlled on today's visit. No changes made to the medications. Stable  Bilateral carotid artery stenosis 60-79% bilateral  disease We have placed an order for repeat carotid ultrasound Previous results discussed with him in detail  Tobacco abuse Stopped 2014, does not need assistance with smoking cessation Still not smoking in follow-up today  Type 2 diabetes, controlled Currently off metformin Reports sugars were better, eating better  Disposition:   F/U  12 months   Total encounter time more than 25 minutes  Greater than 50% was spent in counseling and coordination of care with the patient    Orders Placed This Encounter  Procedures  . EKG 12-Lead     Signed, Dossie Arbourim Jocob Dambach, M.D., Ph.D. 07/23/2017  [facility] [place] [phone]

## 2017-07-23 ENCOUNTER — Encounter: Payer: Self-pay | Admitting: Cardiovascular Disease

## 2017-07-23 ENCOUNTER — Ambulatory Visit (INDEPENDENT_AMBULATORY_CARE_PROVIDER_SITE_OTHER): Payer: Medicare Other | Admitting: Cardiovascular Disease

## 2017-07-23 VITALS — BP 142/70 | HR 59 | Ht 66.0 in | Wt 142.5 lb

## 2017-07-23 DIAGNOSIS — I10 Essential (primary) hypertension: Secondary | ICD-10-CM | POA: Diagnosis not present

## 2017-07-23 DIAGNOSIS — E1159 Type 2 diabetes mellitus with other circulatory complications: Secondary | ICD-10-CM | POA: Diagnosis not present

## 2017-07-23 DIAGNOSIS — I25118 Atherosclerotic heart disease of native coronary artery with other forms of angina pectoris: Secondary | ICD-10-CM

## 2017-07-23 DIAGNOSIS — Z720 Tobacco use: Secondary | ICD-10-CM

## 2017-07-23 DIAGNOSIS — E782 Mixed hyperlipidemia: Secondary | ICD-10-CM | POA: Diagnosis not present

## 2017-07-23 DIAGNOSIS — I6523 Occlusion and stenosis of bilateral carotid arteries: Secondary | ICD-10-CM | POA: Diagnosis not present

## 2017-07-23 MED ORDER — TICAGRELOR 60 MG PO TABS: 60.0000 mg | ORAL_TABLET | Freq: Two times a day (BID) | ORAL | 3 refills | Status: DC

## 2017-07-23 MED ORDER — METOPROLOL TARTRATE 25 MG PO TABS: ORAL_TABLET | ORAL | 3 refills | Status: DC

## 2017-07-23 MED ORDER — ATORVASTATIN CALCIUM 80 MG PO TABS: ORAL_TABLET | ORAL | 3 refills | Status: DC

## 2017-07-23 NOTE — Patient Instructions (Addendum)
Medication Instructions:   No medication changes made  We will refill your cardiac medications 90 days with refills  Labwork:  No new labs needed  Testing/Procedures:  Carotid ultrasound for known stenosis on both sides   Follow-Up: It was a pleasure seeing you in the office today. Please call us if you have new issues that need to be addressed before your next appt.  [phone]  Your physician wants you to follow-up in: 12 months.  You will receive a reminder letter in the mail two months in advance. If you don't receive a letter, please call our office to schedule the follow-up appointment.  If you need a refill on your cardiac medications before your next appointment, please call your pharmacy.  For educational health videos Log in to : [email or web address] Or : [email or web address], password : [email or web address]

## 2017-07-29 ENCOUNTER — Ambulatory Visit (INDEPENDENT_AMBULATORY_CARE_PROVIDER_SITE_OTHER): Payer: Medicare Other

## 2017-07-29 DIAGNOSIS — I6523 Occlusion and stenosis of bilateral carotid arteries: Secondary | ICD-10-CM | POA: Diagnosis not present

## 2017-08-08 ENCOUNTER — Encounter: Payer: Self-pay | Admitting: *Deleted

## 2017-08-08 ENCOUNTER — Other Ambulatory Visit: Payer: Self-pay | Admitting: *Deleted

## 2017-08-08 DIAGNOSIS — I6523 Occlusion and stenosis of bilateral carotid arteries: Secondary | ICD-10-CM

## 2017-11-18 ENCOUNTER — Other Ambulatory Visit: Payer: Self-pay | Admitting: Cardiovascular Disease

## 2018-07-28 ENCOUNTER — Other Ambulatory Visit: Payer: Self-pay | Admitting: Cardiovascular Disease

## 2018-09-11 ENCOUNTER — Telehealth: Payer: Self-pay

## 2018-09-11 NOTE — Telephone Encounter (Signed)
Virtual Visit Pre-Appointment Phone Call  "Dann, I am calling you today to discuss your upcoming appointment. We are currently trying to limit exposure to the virus that causes COVID-19 by seeing patients at home rather than in the office."  1. "What is the BEST phone number to call the day of the visit?" - include this in appointment notes  2. Do you have or have access to (through a family member/friend) a smartphone with video capability that we can use for your visit?" a. If yes - list this number in appt notes as cell (if different from BEST phone #) and list the appointment type as a VIDEO visit in appointment notes b. If no - list the appointment type as a PHONE visit in appointment notes  3. Confirm consent - "In the setting of the current Covid19 crisis, you are scheduled for a phone visit with your provider on Sep 22, 2018 at 1:30PM.  Just as we do with many in-office visits, in order for you to participate in this visit, we must obtain consent.  If you'd like, I can send this to your mychart (if signed up) or email for you to review.  Otherwise, I can obtain your verbal consent now.  All virtual visits are billed to your insurance company just like a normal visit would be.  By agreeing to a virtual visit, we'd like you to understand that the technology does not allow for your provider to perform an examination, and thus may limit your provider's ability to fully assess your condition. If your provider identifies any concerns that need to be evaluated in person, we will make arrangements to do so.  Finally, though the technology is pretty good, we cannot assure that it will always work on either your or our end, and in the setting of a video visit, we may have to convert it to a phone-only visit.  In either situation, we cannot ensure that we have a secure connection.  Are you willing to proceed?" STAFF: Did the patient verbally acknowledge consent to telehealth visit? Document YES/NO  here: YES  4. Advise patient to be prepared - "Two hours prior to your appointment, go ahead and check your blood pressure, pulse, oxygen saturation, and your weight (if you have the equipment to check those) and write them all down. When your visit starts, your provider will ask you for this information. If you have an Apple Watch or Kardia device, please plan to have heart rate information ready on the day of your appointment. Please have a pen and paper handy nearby the day of the visit as well."  5. Give patient instructions for MyChart download to smartphone OR [email or web address].me as below if video visit (depending on what platform provider is using)  6. Inform patient they will receive a phone call 15 minutes prior to their appointment time (may be from unknown caller ID) so they should be prepared to answer    TELEPHONE CALL NOTE  MAYES SCHUDEL has been deemed a candidate for a follow-up tele-health visit to limit community exposure during the Covid-19 pandemic. I spoke with the patient via phone to ensure availability of phone/video source, confirm preferred email & phone number, and discuss instructions and expectations.  I reminded ASHAD IMEL to be prepared with any vital sign and/or heart rhythm information that could potentially be obtained via home monitoring, at the time of his visit. I reminded BRENNEN OKITA to expect a phone call prior to  his visit.  Amor Packard L Newcomer McClain 09/11/2018 3:55 PM  IF USING DOXIMITY or DOXY.ME - The patient will receive a link just prior to their visit by text.    FULL LENGTH CONSENT FOR TELE-HEALTH VISIT   I hereby voluntarily request, consent and authorize [facility] and its employed or contracted physicians, physician assistants, nurse practitioners or other licensed health care professionals (the Practitioner), to provide me with telemedicine health care services (the Services") as deemed necessary by the treating  Practitioner. I acknowledge and consent to receive the Services by the Practitioner via telemedicine. I understand that the telemedicine visit will involve communicating with the Practitioner through live audiovisual communication technology and the disclosure of certain medical information by electronic transmission. I acknowledge that I have been given the opportunity to request an in-person assessment or other available alternative prior to the telemedicine visit and am voluntarily participating in the telemedicine visit.  I understand that I have the right to withhold or withdraw my consent to the use of telemedicine in the course of my care at any time, without affecting my right to future care or treatment, and that the Practitioner or I may terminate the telemedicine visit at any time. I understand that I have the right to inspect all information obtained and/or recorded in the course of the telemedicine visit and may receive copies of available information for a reasonable fee.  I understand that some of the potential risks of receiving the Services via telemedicine include:   Delay or interruption in medical evaluation due to technological equipment failure or disruption;  Information transmitted may not be sufficient (e.g. poor resolution of images) to allow for appropriate medical decision making by the Practitioner; and/or   In rare instances, security protocols could fail, causing a breach of personal health information.  Furthermore, I acknowledge that it is my responsibility to provide information about my medical history, conditions and care that is complete and accurate to the best of my ability. I acknowledge that Practitioner's advice, recommendations, and/or decision may be based on factors not within their control, such as incomplete or inaccurate data provided by me or distortions of diagnostic images or specimens that may result from electronic transmissions. I understand that the  practice of medicine is not an exact science and that Practitioner makes no warranties or guarantees regarding treatment outcomes. I acknowledge that I will receive a copy of this consent concurrently upon execution via email to the email address I last provided but may also request a printed copy by calling the office of [facility].    I understand that my insurance will be billed for this visit.   I have read or had this consent read to me.  I understand the contents of this consent, which adequately explains the benefits and risks of the Services being provided via telemedicine.   I have been provided ample opportunity to ask questions regarding this consent and the Services and have had my questions answered to my satisfaction.  I give my informed consent for the services to be provided through the use of telemedicine in my medical care  By participating in this telemedicine visit I agree to the above.

## 2018-09-21 NOTE — Progress Notes (Signed)
Virtual Visit via Telephone Note   This visit type was conducted due to national recommendations for restrictions regarding the COVID-19 Pandemic (e.g. social distancing) in an effort to limit this patient's exposure and mitigate transmission in our community.  Due to his co-morbid illnesses, this patient is at least at moderate risk for complications without adequate follow up.  This format is felt to be most appropriate for this patient at this time.  The patient did not have access to video technology/had technical difficulties with video requiring transitioning to audio format only (telephone).  All issues noted in this document were discussed and addressed.  No physical exam could be performed with this format.  Please refer to the patient's chart for his  consent to telehealth for [facility].   Date:  09/22/2018   ID:  [name], DOB [birth year], MRN [id number]  Patient Location: Home Provider Location: Home  PCP:  Tiana Loft, MD  Cardiologist:  Julien Nordmann, MD  Electrophysiologist:  None   Evaluation Performed:  Follow-Up Visit  Chief Complaint:  Follow up CAD and carotid artery disease   History of Present Illness:    Thomas Sexton is a 78 y.o. male with history of CAD with ST elevation MI in 09/2012 s/p PCI/BMS to the OM1, carotid artery stenosis, DM2, HTN, and prior tobacco abuse who presents for follow up.   He was admitted to the hospital in 2014 with an ST elevation MI leading to PCI/BMS to the OM1 with residual 40% mid and 30% distal LAD stenoses, 50% diagonal stenosis, 30% mid RCA stenosis, and 30% rPDA. Echo at that time showed an EF of 55-60%. He has not needed further ischemic evaluation.   Screening abdominal ultrasound in 05/2017 showed no evidence of AAA.   He has known bilateral carotid artery disease with his most recent carotid artery ultrasound in 07/2017 showing stable 60-79% bilateral carotid artery disease.   Labs: 05/2018 - LDL 47, TG  75, K+ 5.1, SCr 1.28,  04/2018 - LFT normal, HGB 11.7, A1c 5.9  He is doing well. Since he was last seen, he has not had any chest pain, SOB, palpitations, dizziness, presyncope, or syncope. No lower extremity swelling, abdominal distension, orthopnea, PND, or early satiety. His weight has remained stable. No falls, BRBPR, or melena. He is tolerating all medications. He does not have any issues or concerns at this time.    The patient does not have symptoms concerning for COVID-19 infection (fever, chills, cough, or new shortness of breath).    Past Medical History:  Diagnosis Date  . CAD (coronary artery disease)    a. 09/2012 Acute STEMI/Cath/PCI: LM nl, LAD 71m, 30d, D1 50, LCX mild prox plaque, OM1 100 (2.5x16 Promus Premier DES), RCA dom, 49m, PDA 30p;  b. 09/2012 Echo: EF 55-60%, PASP .  Marland Kitchen HTN (hypertension)   . Hyperlipidemia   . Tobacco abuse    Past Surgical History:  Procedure Laterality Date  . CARDIAC CATHETERIZATION    . CORONARY ANGIOPLASTY  09/2012   a. 09/2012 Acute STEMI/Cath/PCI: LM nl, LAD 32m, 30d, D1 50, LCX mild prox plaque, OM1 100 (2.5x16 Promus Premier DES), RCA dom, 63m, PDA 30p;  b. 09/2012 Echo: EF 55-60%, PASP .  Marland Kitchen LEFT HEART CATH  09/30/2012   Procedure: LEFT HEART CATH;  Surgeon: Kathleene Hazel, MD;  Location: [facility] CATH LAB;  Service: Cardiovascular;;  . LEFT HEART CATHETERIZATION WITH CORONARY ANGIOGRAM N/A 09/30/2012   Procedure: STEMI;  Surgeon: Cristal Deer  Ellene Route McAlhany, MD;  Location: [facility] CATH LAB;  Service: Cardiovascular;  Laterality: N/A;  . PERCUTANEOUS CORONARY STENT INTERVENTION (PCI-S)  09/30/2012   Procedure: PERCUTANEOUS CORONARY STENT INTERVENTION (PCI-S);  Surgeon: Kathleene Hazelhristopher D McAlhany, MD;  [facility];  Service: Cardiovascular;;     Current Meds  Medication Sig  . aspirin EC 81 MG EC tablet Take 1 tablet (81 mg total) by mouth daily.  Marland Kitchen. atorvastatin (LIPITOR) 80 MG tablet TAKE 1 TABLET BY MOUTH EVERY DAY AT 6PM  .  BRILINTA 60 MG TABS tablet TAKE 1 TABLET(60 MG) BY MOUTH TWICE DAILY  . cholecalciferol (VITAMIN D) 1000 UNITS tablet Take 1,000 Units by mouth daily.  . metoprolol tartrate (LOPRESSOR) 25 MG tablet TAKE 1 TABLET BY MOUTH TWICE DAILY  . vitamin B-12 (CYANOCOBALAMIN) 1000 MCG tablet Take 1,000 mcg by mouth daily.     Allergies:   Patient has no known allergies.   Social History   Tobacco Use  . Smoking status: Former Smoker    Packs/day: 0.50    Years: 50.00    Pack years: 25.00    Types: Cigarettes    Last attempt to quit: 09/27/2012    Years since quitting: 5.9  . Smokeless tobacco: Never Used  Substance Use Topics  . Alcohol use: No  . Drug use: No     Family Hx: The patient's family history includes Heart attack in his mother; Hyperlipidemia in his father; Hypertension in his father.  ROS:   Please see the history of present illness.     All other systems reviewed and are negative.   Prior CV studies:   The following studies were reviewed today:  LHC and echo from 2014 as above.  Labs/Other Tests and Data Reviewed:    EKG:  No ECG reviewed.  Recent Labs: No results found for requested labs within last 8760 hours.   Recent Lipid Panel Lab Results  Component Value Date/Time   CHOL 94 (L) 05/31/2015 02:55 PM   TRIG 75 05/31/2015 02:55 PM   HDL 42 05/31/2015 02:55 PM   CHOLHDL 2.2 05/31/2015 02:55 PM   LDLCALC 37 05/31/2015 02:55 PM    Wt Readings from Last 3 Encounters:  09/22/18 145 lb (65.8 kg)  07/23/17 142 lb 8 oz (64.6 kg)  05/31/15 145 lb 4 oz (65.9 kg)     Objective:    Vital Signs:  Pulse 65   Wt 145 lb (65.8 kg)   BMI 23.40 kg/m    VITAL SIGNS:  reviewed  ASSESSMENT & PLAN:    1. CAD of the native coronary arteries without angina: He is doing well without any symptoms concerning for angina. Continue current medical regimen including ASA, Brilinta, Lipitor, and Lopressor. Aggressive secondary preventions. No plans for ischemic evaluation  at this time.   2. Carotid artery disease: Asymptomatic. Stable on exam in 2019. Will plan to repeat imaging in 02/2019 (deferred shortly during the COIVD-19 pandemic). Continue current medical management and aggressive risk factor modification.   3. HLD: Most recent LDL of 47 as above. Continue Lipitor.   4. HTN: No BP cuff at home. Tolerating Lopressor as above.   5. Tobacco abuse: Quit in 2014.   COVID-19 Education: The signs and symptoms of COVID-19 were discussed with the patient and how to seek care for testing (follow up with PCP or arrange E-visit).  The importance of social distancing was discussed today.  Time:   Today, I have spent 8 minutes with the patient with  telehealth technology discussing the above problems.     Medication Adjustments/Labs and Tests Ordered: Current medicines are reviewed at length with the patient today.  Concerns regarding medicines are outlined above.   Tests Ordered: No orders of the defined types were placed in this encounter.   Medication Changes: No orders of the defined types were placed in this encounter.   Disposition:  Follow up in 6 month(s)  Signed, Eula Listen, PA-C  09/22/2018 1:32 PM    [facility]

## 2018-09-22 ENCOUNTER — Telehealth (INDEPENDENT_AMBULATORY_CARE_PROVIDER_SITE_OTHER): Payer: Medicare Other | Admitting: Physician Assistant

## 2018-09-22 ENCOUNTER — Other Ambulatory Visit: Payer: Self-pay

## 2018-09-22 VITALS — HR 65 | Wt 145.0 lb

## 2018-09-22 DIAGNOSIS — Z720 Tobacco use: Secondary | ICD-10-CM

## 2018-09-22 DIAGNOSIS — I2510 Atherosclerotic heart disease of native coronary artery without angina pectoris: Secondary | ICD-10-CM | POA: Diagnosis not present

## 2018-09-22 DIAGNOSIS — E785 Hyperlipidemia, unspecified: Secondary | ICD-10-CM

## 2018-09-22 DIAGNOSIS — I10 Essential (primary) hypertension: Secondary | ICD-10-CM

## 2018-09-22 DIAGNOSIS — I6523 Occlusion and stenosis of bilateral carotid arteries: Secondary | ICD-10-CM

## 2018-09-22 NOTE — Patient Instructions (Signed)
It was a pleasure to speak with you on the phone today! Thank you for allowing [place] to continue taking care of your [facility] needs during this time.   Feel free to call as needed for questions and concerns related to your cardiac needs.   Medication Instructions:  Your physician recommends that you continue on your current medications as directed. Please refer to the Current Medication list given to you today.  If you need a refill on your cardiac medications before your next appointment, please call your pharmacy.   Lab work: None ordered  If you have labs (blood work) drawn today and your tests are completely normal, you will receive your results only by: Marland Kitchen MyChart Message (if you have MyChart) OR . A paper copy in the mail If you have any lab test that is abnormal or we need to change your treatment, we will call you to review the results.  Testing/Procedures: 1- Carotid u/s (October 2020) Your physician has requested that you have a carotid duplex. This test is an ultrasound of the carotid arteries in your neck. It looks at blood flow through these arteries that supply the brain with blood. Allow one hour for this exam. There are no restrictions or special instructions.   Follow-Up: At [facility], you and your health needs are our priority.  As part of our continuing mission to provide you with exceptional heart care, we have created designated Provider Care Teams.  These Care Teams include your primary Cardiologist (physician) and Advanced Practice Providers (APPs -  Physician Assistants and Nurse Practitioners) who all work together to provide you with the care you need, when you need it. You will need a follow up appointment in 6 months.  Please call our office 2 months in advance to schedule this appointment.  You may see Julien Nordmann, MD or Eula Listen, PA-C.

## 2018-10-27 ENCOUNTER — Other Ambulatory Visit: Payer: Self-pay | Admitting: Cardiovascular Disease

## 2019-06-19 ENCOUNTER — Other Ambulatory Visit: Payer: Self-pay | Admitting: Physician Assistant

## 2019-06-19 DIAGNOSIS — I6523 Occlusion and stenosis of bilateral carotid arteries: Secondary | ICD-10-CM

## 2019-07-20 ENCOUNTER — Ambulatory Visit: Payer: Medicare Other | Admitting: Cardiovascular Disease

## 2019-07-23 ENCOUNTER — Other Ambulatory Visit: Payer: Self-pay | Admitting: Cardiovascular Disease

## 2019-08-11 ENCOUNTER — Ambulatory Visit (INDEPENDENT_AMBULATORY_CARE_PROVIDER_SITE_OTHER): Payer: Medicare Other

## 2019-08-11 ENCOUNTER — Other Ambulatory Visit: Payer: Self-pay

## 2019-08-11 DIAGNOSIS — I6523 Occlusion and stenosis of bilateral carotid arteries: Secondary | ICD-10-CM

## 2019-08-12 ENCOUNTER — Telehealth: Payer: Self-pay | Admitting: *Deleted

## 2019-08-12 DIAGNOSIS — I6523 Occlusion and stenosis of bilateral carotid arteries: Secondary | ICD-10-CM

## 2019-08-12 NOTE — Telephone Encounter (Signed)
Attempted to call all numbers listed and unable to reach patient. Trying to ensure he is going to have this test done this week but unable to reach him again to confirm and schedule testing.

## 2019-08-12 NOTE — Telephone Encounter (Signed)
FYI only- Pam has assumed care over this patient per request from Dr. Mariah Milling. I am forwarding result to her as she has already been in contact with patient and placed orders.

## 2019-08-12 NOTE — Telephone Encounter (Signed)
-----   Message from Sondra Barges, [place] sent at 08/12/2019  2:15 PM EDT ----- Carotid artery ultrasound showed progression to 80-99% narrowing of the right carotid artery and 40-59% narrowing of the left carotid artery.   Recommendations: -Please schedule neck CTA to further evaluate his stenosis of the RICA, this will also help expedite his vascular surgery work up, would like for this to be done this week -Please place urgent referral to vascular surgery -Most recent LDL of 47 from 05/2018  -Continue ASA and Lipitor

## 2019-08-12 NOTE — Telephone Encounter (Signed)
Spoke with patient and reviewed that provider would like him to have CT of neck for his carotid disease. He was agreeable with this plan and order has been placed. Provided him with number [phone] to call and schedule testing to be done. He verbalized understanding of our conversation with no further questions at this time. Message sent to our pre-certification team for order as well.

## 2019-08-12 NOTE — Telephone Encounter (Signed)
-----   Message from Thomas Iba, MD sent at 08/11/2019  9:52 PM EDT ----- I think with progression of his carotid disease, We should order a neck CTA asap  Thx TG

## 2019-08-13 NOTE — Telephone Encounter (Signed)
3rd attempt, unable to lmtcb.

## 2019-08-13 NOTE — Telephone Encounter (Signed)
2nd attempt to try and contact the patient. Unable to lmtcb. Patients voicemail is full.

## 2019-08-15 NOTE — Progress Notes (Signed)
Cardiology Office Note  Date:  08/17/2019   ID:  Thomas Sexton, DOB 1940-05-23, MRN [id number]  PCP:  Tiana Loft, MD   Chief Complaint  Patient presents with  . office visit    Pt has no complaints. Meds verbally reviewed w/ pt.    HPI:  Thomas Sexton is a pleasant 79 year old gentleman with PMH of  CAD [facility] on Sep 30 2012 with chest pain,  STEMI, cardiac catheterization Sep 30, 2012  bare-metal stent placed to his OM1. 40% mid and 30% distal LAD disease, 50% diagonal disease, 30% RCA disease in the mid region with 30% proximal PDA disease. Troponin was greater than 20. Echo 10/01/2012 showing normal ejection fraction55-60% Carotid ultrasound 60 to 79% b/l disease 05/2015 Recent carotid 80 to 99% stenosis on the right He presentsrecent today for follow-up of his coronary artery disease  Carotid u/s,  Velocities in the right ICA are consistent with a 80-99% stenosis. Non-hemodynamically significant plaque <50% noted  In the CCA. The ECA appears <50% stenosed.  Velocities in the left ICA are consistent with a 40-59%  stenosis.   He denies TIA or CVA symptoms Rare blurry vision  Labs reviewed HBA1C 5.8 CR 1.4 Total chol 91, LDL 47   active at baseline Walks 1 mile a day No angina, no SOB  EKG personally reviewed by myself on todays visit Shows sinus bradycardia rate 72 bpm nonspecific T wave abnormality, no change from prior EKGs   PMH:   has a past medical history of CAD (coronary artery disease), HTN (hypertension), Hyperlipidemia, and Tobacco abuse.  PSH:    Past Surgical History:  Procedure Laterality Date  . CARDIAC CATHETERIZATION    . CORONARY ANGIOPLASTY  09/2012   a. 09/2012 Acute STEMI/Cath/PCI: LM nl, LAD 83m, 30d, D1 50, LCX mild prox plaque, OM1 100 (2.5x16 Promus Premier DES), RCA dom, 50m, PDA 30p;  b. 09/2012 Echo: EF 55-60%, PASP .  Marland Kitchen LEFT HEART CATH  09/30/2012   Procedure: LEFT HEART CATH;  Surgeon: Kathleene Hazel, MD;   Location: [facility] CATH LAB;  Service: Cardiovascular;;  . LEFT HEART CATHETERIZATION WITH CORONARY ANGIOGRAM N/A 09/30/2012   Procedure: STEMI;  Surgeon: Kathleene Hazel, MD;  Location: [facility] CATH LAB;  Service: Cardiovascular;  Laterality: N/A;  . PERCUTANEOUS CORONARY STENT INTERVENTION (PCI-S)  09/30/2012   Procedure: PERCUTANEOUS CORONARY STENT INTERVENTION (PCI-S);  Surgeon: Kathleene Hazel, MD;  Location: [facility] CATH LAB;  Service: Cardiovascular;;    Current Outpatient Medications  Medication Sig Dispense Refill  . aspirin EC 81 MG EC tablet Take 1 tablet (81 mg total) by mouth daily.    Marland Kitchen atorvastatin (LIPITOR) 80 MG tablet TAKE 1 TABLET BY MOUTH EVERY DAY AT 6 PM 90 tablet 0  . cholecalciferol (VITAMIN D) 1000 UNITS tablet Take 1,000 Units by mouth daily.    . metoprolol tartrate (LOPRESSOR) 25 MG tablet TAKE 1 TABLET BY MOUTH TWICE DAILY 60 tablet 5  . nitroGLYCERIN (NITROSTAT) 0.4 MG SL tablet Place 1 tablet (0.4 mg total) under the tongue every 5 (five) minutes x 3 doses as needed for chest pain. 25 tablet 3  . vitamin B-12 (CYANOCOBALAMIN) 1000 MCG tablet Take 1,000 mcg by mouth daily.     No current facility-administered medications for this visit.     Allergies:   Patient has no known allergies.   Social History:  The patient  reports that he quit smoking about 6 years ago. His smoking use included cigarettes. He has a 25.00  pack-year smoking history. He has never used smokeless tobacco. He reports that he does not drink alcohol or use drugs.   Family History:   family history includes Heart attack in his mother; Hyperlipidemia in his father; Hypertension in his father.    Review of Systems: Review of Systems  Constitutional: Negative.   Respiratory: Negative.   Cardiovascular: Negative.   Gastrointestinal: Negative.   Musculoskeletal: Negative.   Neurological: Negative.   Psychiatric/Behavioral: Negative.   All other systems reviewed and are negative.   PHYSICAL  EXAM: VS:  BP 128/72 (BP Location: Left Arm, Patient Position: Sitting, Cuff Size: Normal)   Pulse 72   Ht 5\' 4"  (1.626 m)   Wt 140 lb 8 oz (63.7 kg)   SpO2 98%   BMI 24.12 kg/m  , BMI Body mass index is 24.12 kg/m. Constitutional:  oriented to person, place, and time. No distress.  HENT:  Head: Grossly normal Eyes:  no discharge. No scleral icterus.  Neck: No JVD, no carotid bruits  Cardiovascular: Regular rate and rhythm, no murmurs appreciated Pulmonary/Chest: Clear to auscultation bilaterally, no wheezes or rails Abdominal: Soft.  no distension.  no tenderness.  Musculoskeletal: Normal range of motion Neurological:  normal muscle tone. Coordination normal. No atrophy Skin: Skin warm and dry Psychiatric: normal affect, pleasant   Recent Labs: No results found for requested labs within last 8760 hours.    Lipid Panel Lab Results  Component Value Date   CHOL 94 (L) 05/31/2015   HDL 42 05/31/2015   LDLCALC 37 05/31/2015   TRIG 75 05/31/2015      Wt Readings from Last 3 Encounters:  08/17/19 140 lb 8 oz (63.7 kg)  09/22/18 145 lb (65.8 kg)  07/23/17 142 lb 8 oz (64.6 kg)      ASSESSMENT AND PLAN:  Coronary disease with stable angina Currently with no symptoms of angina. No further workup at this time. Continue current medication regimen.  Essential hypertension Blood pressure is well controlled on today's visit. No changes made to the medications. Stable  Bilateral carotid artery stenosis Severe disease on the right We have ordered carotid CTA We will order stat BMP, and have his scan today Will likely need referral to surgery  Tobacco abuse Stopped 2014,  Currently not smoking  Type 2 diabetes, controlled A1c well controlled  Disposition:   F/U  12 months   Total encounter time more than 25 minutes  Greater than 50% was spent in counseling and coordination of care with the patient    Orders Placed This Encounter  Procedures  . EKG 12-Lead      Signed, Esmond Plants, M.D., Ph.D. 08/17/2019  [place], [place]

## 2019-08-17 ENCOUNTER — Ambulatory Visit
Admission: RE | Admit: 2019-08-17 | Discharge: 2019-08-17 | Disposition: A | Discharge status: Home / Self Care | Payer: Medicare Other | Source: Ambulatory Visit | Attending: Cardiovascular Disease | Admitting: Cardiovascular Disease

## 2019-08-17 ENCOUNTER — Other Ambulatory Visit: Payer: Self-pay

## 2019-08-17 ENCOUNTER — Encounter: Payer: Self-pay | Admitting: Cardiovascular Disease

## 2019-08-17 ENCOUNTER — Ambulatory Visit (INDEPENDENT_AMBULATORY_CARE_PROVIDER_SITE_OTHER): Payer: Medicare Other | Admitting: Cardiovascular Disease

## 2019-08-17 VITALS — BP 128/72 | HR 72 | Ht 64.0 in | Wt 140.5 lb

## 2019-08-17 DIAGNOSIS — I10 Essential (primary) hypertension: Secondary | ICD-10-CM

## 2019-08-17 DIAGNOSIS — I6523 Occlusion and stenosis of bilateral carotid arteries: Secondary | ICD-10-CM

## 2019-08-17 DIAGNOSIS — I25118 Atherosclerotic heart disease of native coronary artery with other forms of angina pectoris: Secondary | ICD-10-CM | POA: Diagnosis not present

## 2019-08-17 DIAGNOSIS — Z720 Tobacco use: Secondary | ICD-10-CM

## 2019-08-17 DIAGNOSIS — I739 Peripheral vascular disease, unspecified: Secondary | ICD-10-CM | POA: Diagnosis not present

## 2019-08-17 DIAGNOSIS — E785 Hyperlipidemia, unspecified: Secondary | ICD-10-CM

## 2019-08-17 LAB — POCT I-STAT CREATININE: Creatinine, Ser: 1.5 mg/dL — ABNORMAL HIGH (ref 0.61–1.24)

## 2019-08-17 MED ORDER — IOHEXOL 350 MG/ML SOLN
75.0000 mL | Freq: Once | INTRAVENOUS | Status: AC | PRN
  Administered 2019-08-17: 75 mL via INTRAVENOUS

## 2019-08-17 NOTE — Patient Instructions (Signed)
Labs at radiology The neck CTA for stenosis   Medication Instructions:  No changes  If you need a refill on your cardiac medications before your next appointment, please call your pharmacy.    Lab work: No new labs needed   If you have labs (blood work) drawn today and your tests are completely normal, you will receive your results only by: Marland Kitchen MyChart Message (if you have MyChart) OR . A paper copy in the mail If you have any lab test that is abnormal or we need to change your treatment, we will call you to review the results.   Testing/Procedures: No new testing needed   Follow-Up: At [facility], you and your health needs are our priority.  As part of our continuing mission to provide you with exceptional heart care, we have created designated Provider Care Teams.  These Care Teams include your primary Cardiologist (physician) and Advanced Practice Providers (APPs -  Physician Assistants and Nurse Practitioners) who all work together to provide you with the care you need, when you need it.  . You will need a follow up appointment in 6 months .  Marland Kitchen Providers on your designated Care Team:   . Nicolasa Ducking, NP . Eula Listen, PA-C . Marisue Ivan, PA-C  Any Other Special Instructions Will Be Listed Below (If Applicable).  For educational health videos Log in to : [email or web address] Or : [email or web address], password : [email or web address]

## 2019-08-18 ENCOUNTER — Telehealth: Payer: Self-pay | Admitting: Cardiovascular Disease

## 2019-08-18 DIAGNOSIS — I6523 Occlusion and stenosis of bilateral carotid arteries: Secondary | ICD-10-CM

## 2019-08-18 NOTE — Telephone Encounter (Signed)
No answer/Voicemail box is full.  

## 2019-08-18 NOTE — Telephone Encounter (Signed)
No answer/Goes to busy signal.

## 2019-08-18 NOTE — Telephone Encounter (Signed)
Patient had CT done and now on different encounter trying to call and schedule with surgeon.

## 2019-08-18 NOTE — Telephone Encounter (Signed)
Left voicemail message to call back  

## 2019-08-18 NOTE — Telephone Encounter (Signed)
-----   Message from Antonieta Iba, MD sent at 08/17/2019  6:47 PM EDT ----- Needs to get into vascular ASAP Can we call and see where he wants to go, stay in [place] or [place] Then we can make urgent arrangements Thx TG

## 2019-08-19 ENCOUNTER — Encounter: Payer: Self-pay | Admitting: *Deleted

## 2019-08-19 NOTE — Telephone Encounter (Signed)
I have been unable to reach this patient by phone.  A letter is being sent to the last known home address.

## 2019-08-19 NOTE — Telephone Encounter (Signed)
No answer/Voicemail box is full.  

## 2019-08-27 NOTE — Telephone Encounter (Signed)
Spoke with patient, his sister Eber Jones which he lives with at [phone], and did conference call with Gala Murdoch at [facility]. She has appointment available for him tomorrow and patients sister confirmed to arrive at 08:45AM for 9:00 AM appointment. She was provided with address of location and states that she will make sure he gets there tomorrow. All confirmed and no further questions at this time.

## 2019-08-28 ENCOUNTER — Encounter (INDEPENDENT_AMBULATORY_CARE_PROVIDER_SITE_OTHER): Payer: Self-pay | Admitting: Vascular Surgery

## 2019-08-28 ENCOUNTER — Ambulatory Visit (INDEPENDENT_AMBULATORY_CARE_PROVIDER_SITE_OTHER): Payer: Medicare Other | Admitting: Vascular Surgery

## 2019-08-28 ENCOUNTER — Other Ambulatory Visit: Payer: Self-pay

## 2019-08-28 ENCOUNTER — Telehealth (INDEPENDENT_AMBULATORY_CARE_PROVIDER_SITE_OTHER): Payer: Self-pay

## 2019-08-28 VITALS — BP 162/83 | HR 78 | Resp 14 | Ht 68.0 in | Wt 144.0 lb

## 2019-08-28 DIAGNOSIS — I6529 Occlusion and stenosis of unspecified carotid artery: Secondary | ICD-10-CM | POA: Diagnosis not present

## 2019-08-28 DIAGNOSIS — I10 Essential (primary) hypertension: Secondary | ICD-10-CM | POA: Diagnosis not present

## 2019-08-28 DIAGNOSIS — E782 Mixed hyperlipidemia: Secondary | ICD-10-CM

## 2019-08-28 NOTE — Progress Notes (Signed)
Patient ID: Thomas Sexton, male   DOB: 1940/05/25, 79 y.o.   MRN: [id number]  Chief Complaint  Patient presents with  . New Patient (Initial Visit)    ref. gollan carotid stenosis    HPI Thomas Sexton is a 79 y.o. male.  I am asked to see the patient by Dr. Mariah Milling for evaluation of carotid stenosis.  The patient reports they have been following his carotid disease for some many years, and has gradually progressed.  He denies any focal neurologic symptoms. Specifically, the patient denies amaurosis fugax, speech or swallowing difficulties, or arm or leg weakness or numbness.  His carotid duplex had suggested velocities in the 80 to 99% range on the right and in the 40 to 59% range on the left which prompted a CT angiogram which I have independently reviewed.  This demonstrates a near string sign in the right carotid artery with a clearly greater than 80% stenosis in the internal carotid artery above the bifurcation.  There appeared to be a little poststenotic dilatation with mild to moderate calcification and very little tortuosity.  The lesion is fairly high and is up around the C2 level on the right.  His left common carotid artery has a stenosis in the 70% range below the carotid bifurcation.  He has some mild aortic arch disease and a stenotic smaller right vertebral artery as well.   Past Medical History:  Diagnosis Date  . CAD (coronary artery disease)    a. 09/2012 Acute STEMI/Cath/PCI: LM nl, LAD 34m, 30d, D1 50, LCX mild prox plaque, OM1 100 (2.5x16 Promus Premier DES), RCA dom, 53m, PDA 30p;  b. 09/2012 Echo: EF 55-60%, PASP .  Marland Kitchen HTN (hypertension)   . Hyperlipidemia   . Tobacco abuse     Past Surgical History:  Procedure Laterality Date  . CARDIAC CATHETERIZATION    . CORONARY ANGIOPLASTY  09/2012   a. 09/2012 Acute STEMI/Cath/PCI: LM nl, LAD 43m, 30d, D1 50, LCX mild prox plaque, OM1 100 (2.5x16 Promus Premier DES), RCA dom, 33m, PDA 30p;  b. 09/2012 Echo: EF 55-60%,  PASP .  Marland Kitchen LEFT HEART CATH  09/30/2012   Procedure: LEFT HEART CATH;  Surgeon: Kathleene Hazel, MD;  Location: [facility] CATH LAB;  Service: Cardiovascular;;  . LEFT HEART CATHETERIZATION WITH CORONARY ANGIOGRAM N/A 09/30/2012   Procedure: STEMI;  Surgeon: Kathleene Hazel, MD;  Location: [facility] CATH LAB;  Service: Cardiovascular;  Laterality: N/A;  . PERCUTANEOUS CORONARY STENT INTERVENTION (PCI-S)  09/30/2012   Procedure: PERCUTANEOUS CORONARY STENT INTERVENTION (PCI-S);  Surgeon: Kathleene Hazel, MD;  Location: [facility] CATH LAB;  Service: Cardiovascular;;    Family History  Problem Relation Age of Onset  . Heart attack Mother   . Hypertension Father   . Hyperlipidemia Father   no bleeding or clotting disorders   Social History   Tobacco Use  . Smoking status: Former Smoker    Packs/day: 0.50    Years: 50.00    Pack years: 25.00    Types: Cigarettes    Quit date: 09/27/2012    Years since quitting: 6.9  . Smokeless tobacco: Never Used  Substance Use Topics  . Alcohol use: No  . Drug use: No     No Known Allergies  Current Outpatient Medications  Medication Sig Dispense Refill  . aspirin EC 81 MG EC tablet Take 1 tablet (81 mg total) by mouth daily.    Marland Kitchen atorvastatin (LIPITOR) 80 MG tablet TAKE 1 TABLET BY  MOUTH EVERY DAY AT 6 PM 90 tablet 0  . cholecalciferol (VITAMIN D) 1000 UNITS tablet Take 1,000 Units by mouth daily.    . metoprolol tartrate (LOPRESSOR) 25 MG tablet TAKE 1 TABLET BY MOUTH TWICE DAILY 60 tablet 5  . nitroGLYCERIN (NITROSTAT) 0.4 MG SL tablet Place 1 tablet (0.4 mg total) under the tongue every 5 (five) minutes x 3 doses as needed for chest pain. 25 tablet 3  . vitamin B-12 (CYANOCOBALAMIN) 1000 MCG tablet Take 1,000 mcg by mouth daily.     No current facility-administered medications for this visit.      REVIEW OF SYSTEMS (Negative unless checked)  Constitutional: [] Weight loss  [] Fever  [] Chills Cardiac: [] Chest pain   [] Chest pressure    [] Palpitations   [] Shortness of breath when laying flat   [] Shortness of breath at rest   [] Shortness of breath with exertion. Vascular:  [] Pain in legs with walking   [] Pain in legs at rest   [] Pain in legs when laying flat   [] Claudication   [] Pain in feet when walking  [] Pain in feet at rest  [] Pain in feet when laying flat   [] History of DVT   [] Phlebitis   [] Swelling in legs   [] Varicose veins   [] Non-healing ulcers Pulmonary:   [] Uses home oxygen   [] Productive cough   [] Hemoptysis   [] Wheeze  [] COPD   [] Asthma Neurologic:  [] Dizziness  [] Blackouts   [] Seizures   [] History of stroke   [] History of TIA  [] Aphasia   [] Temporary blindness   [] Dysphagia   [] Weakness or numbness in arms   [] Weakness or numbness in legs Musculoskeletal:  [x] Arthritis   [] Joint swelling   [x] Joint pain   [] Low back pain Hematologic:  [] Easy bruising  [] Easy bleeding   [] Hypercoagulable state   [] Anemic  [] Hepatitis Gastrointestinal:  [] Blood in stool   [] Vomiting blood  [x] Gastroesophageal reflux/heartburn   [] Abdominal pain Genitourinary:  [] Chronic kidney disease   [] Difficult urination  [] Frequent urination  [] Burning with urination   [] Hematuria Skin:  [] Rashes   [] Ulcers   [] Wounds Psychological:  [] History of anxiety   []  History of major depression.    Physical Exam BP (!) 162/83 (BP Location: Right Arm)   Pulse 78   Resp 14   Ht 5\' 8"  (1.727 m)   Wt 144 lb (65.3 kg)   BMI 21.90 kg/m  Gen:  WD/WN, NAD.  Appears younger than stated age Head: [place]/AT, No temporalis wasting.  Ear/Nose/Throat: Hearing grossly intact, nares w/o erythema or drainage, oropharynx w/o Erythema/Exudate Eyes: Conjunctiva clear, sclera non-icteric  Neck: trachea midline.  Bilateral carotid bruit Pulmonary:  Good air movement, clear to auscultation bilaterally.  Cardiac: RRR, normal S1, S2 Vascular:  Vessel Right Left  Radial Palpable Palpable                                   Gastrointestinal: soft,  non-tender/non-distended.  Musculoskeletal: M/S 5/5 throughout.  Extremities without ischemic changes.  No deformity or atrophy.  No edema. Neurologic: Sensation grossly intact in extremities.  Symmetrical.  Speech is fluent. Motor exam as listed above. Psychiatric: Judgment intact, Mood & affect appropriate for pt's clinical situation. Dermatologic: No rashes or ulcers noted.  No cellulitis or open wounds.    Radiology CT ANGIO NECK W OR WO CONTRAST  Result Date: 08/17/2019 CLINICAL DATA:  79 year old male with bilateral carotid stenosis and right neck tightness. EXAM: CT ANGIOGRAPHY  NECK TECHNIQUE: Multidetector CT imaging of the neck was performed using the standard protocol during bolus administration of intravenous contrast. Multiplanar CT image reconstructions and MIPs were obtained to evaluate the vascular anatomy. Carotid stenosis measurements (when applicable) are obtained utilizing NASCET criteria, using the distal internal carotid diameter as the denominator. CONTRAST:  47mL OMNIPAQUE IOHEXOL 350 MG/ML SOLN COMPARISON:  Portable chest radiograph today. FINDINGS: Skeleton: Absent dentition. Cervical and upper thoracic spine degeneration appears mild for age. No acute osseous abnormality identified. Upper chest: Negative lung apices. No superior mediastinal lymphadenopathy. Other neck: Subcentimeter scattered bilateral hypodense thyroid nodules No followup recommended (ref: J Am Coll Radiol. 2015 Feb;12(2): 143-50).Elsewhere negative neck soft tissues. Aortic arch: 3 vessel arch configuration with bulky soft plaque, especially at the distal lateral arch where a thrombosed small 7 mm pseudoaneurysm is possible on series 6, image 100. Right carotid system: Brachiocephalic artery soft plaque without stenosis (series 6, image 88). Normal right CCA origin. Minimal soft plaque in the right CCA proximal to the bifurcation without stenosis. Mild soft and calcified plaque at the bifurcation, but bulky  severe mostly soft plaque in the distal right ICA bulb resulting in short segment stenosis (3-4 mm) approaching a RADIOGRAPHIC STRING SIGN (series 10, image 43) and numerically estimated at 75-80 % with respect to the distal vessel. The right ICA remains patent to the skull base with mild tortuosity. The visible right ICA siphon is patent with only mild calcified plaque. Left carotid system: Multifocal soft plaque left CCA proximal to the bifurcation generally results in less than 50 % stenosis with respect to the distal vessel. However, about 9 mm proximal to the bifurcation there is a focal stenosis numerically estimated at 70 % with respect to the distal vessel (series 9, image 107). Atherosclerotic irregularity continues throughout the bifurcation, and the left ICA bulb, but with less than 50 % stenosis with respect to the distal vessel. Furthermore, there is a beaded appearance of the cervical left ICA just below the skull base (series 13, image 21) suggesting fibromuscular dysplasia. The visible left ICA siphon is patent with up to moderate calcified plaque. Vertebral arteries: Soft and calcified plaque at the right subclavian artery origin without significant stenosis. Normal right vertebral artery origin. Right V1 segment soft plaque results in mild to moderate long segment stenosis (series 9 image 117). Beyond that the right vertebral appears non dominant but is patent into the posterior fossa without additional stenosis. Mild proximal left subclavian artery plaque without stenosis. Dominant left vertebral artery with a mildly ectatic origin. Tortuous left V1 segment. No left vertebral artery stenosis into the visible posterior fossa. Anatomic variants: Dominant left vertebral artery. Review of the MIP images confirms the above findings IMPRESSION: 1. High-grade Right ICA distal bulb stenosis approaching a RADIOGRAPHIC STRING SIGN. See series 10, image 43. 2. Left CCA 70% stenosis occurs about 9 mm proximal  to the left carotid bifurcation. 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 3. Bulky soft plaque in the distal Aortic arch where a thrombosed 7 mm Pseudoaneurysm is possible. See series 6, image 100. Aortic Atherosclerosis (ICD10-I70.0). 4. Mild to moderate long stenosis of the non-dominant Right Vertebral Artery V1 segment due to soft plaque. Electronically Signed   By: Odessa Fleming M.D.   On: 08/17/2019 18:09   VAS US CAROTID  Result Date: 08/13/2019 Carotid Arterial Duplex Study Risk Factors:      Hypertension, hyperlipidemia, past history of smoking,  prior                    MI, coronary artery disease. Other Factors:     Patient denied all cerebrovascular symptoms. Comparison Study:  07/29/17 carotid duplex exam showed RICA velocities of 309/111                    cm/s and LICA velocities of 161/09 cm/s Performing Technologist: Pilar Jarvis RDMS, RVT, RDCS  Examination Guidelines: A complete evaluation includes B-mode imaging, spectral Doppler, color Doppler, and power Doppler as needed of all accessible portions of each vessel. Bilateral testing is considered an integral part of a complete examination. Limited examinations for reoccurring indications may be performed as noted.  Right Carotid Findings: +----------+-------+-------+--------+--------------------------------+---------+           PSV    EDV    StenosisPlaque Description              Comments            cm/s   cm/s                                                     +----------+-------+-------+--------+--------------------------------+---------+ CCA Prox  53     7                                                        +----------+-------+-------+--------+--------------------------------+---------+ CCA Distal45     14     <50%    smooth, homogeneous and                                                   hypoechoic                                 +----------+-------+-------+--------+--------------------------------+---------+ ICA Prox  342    174    80-99%  homogeneous, hypoechoic and                                               diffuse                                   +----------+-------+-------+--------+--------------------------------+---------+ ICA Mid   75     40                                             Shadowing +----------+-------+-------+--------+--------------------------------+---------+ ICA Distal94     26                                                       +----------+-------+-------+--------+--------------------------------+---------+  ECA       84     15                                                       +----------+-------+-------+--------+--------------------------------+---------+ +----------+--------+-------+----------------+-------------------+           PSV cm/sEDV cmsDescribe        Arm Pressure (mmHG) +----------+--------+-------+----------------+-------------------+ [id number]            Multiphasic, [id number]                 +----------+--------+-------+----------------+-------------------+ +---------+--------+--+--------+--+---------+ VertebralPSV cm/s84EDV cm/s19Antegrade +---------+--------+--+--------+--+---------+  Left Carotid Findings: +----------+--------+--------+--------+-------------------------------+--------+           PSV cm/sEDV cm/sStenosisPlaque Description             Comments +----------+--------+--------+--------+-------------------------------+--------+ CCA Prox  80      15                                                      +----------+--------+--------+--------+-------------------------------+--------+ CCA Mid                           hypoechoic, homogeneous, smooth                                           and diffuse                              +----------+--------+--------+--------+-------------------------------+--------+ CCA Distal269     62      >50%                                            +----------+--------+--------+--------+-------------------------------+--------+ ICA Prox  207     44      40-59%  diffuse, homogeneous and smooth         +----------+--------+--------+--------+-------------------------------+--------+ ICA Mid   82      29      1-39%                                           +----------+--------+--------+--------+-------------------------------+--------+ ICA Distal66      27                                                      +----------+--------+--------+--------+-------------------------------+--------+ ECA       265     33      >50%    homogeneous                             +----------+--------+--------+--------+-------------------------------+--------+ +----------+--------+--------+----------------+-------------------+  PSV cm/sEDV cm/sDescribe        Arm Pressure (mmHG) +----------+--------+--------+----------------+-------------------+ [id number]              Multiphasic, [id number]                 +----------+--------+--------+----------------+-------------------+ +---------+--------+--+--------+--+---------+ VertebralPSV cm/s89EDV cm/s18Antegrade +---------+--------+--+--------+--+---------+  Bilateral thyroid glands were heterogeneous and contained multiple cystic structures   Findings reported to Dr Karie Schwalbe. Mariah Milling at 1600. Summary: Right Carotid: Velocities in the right ICA are consistent with a 80-99%                stenosis. Non-hemodynamically significant plaque <50% noted in                the CCA. The ECA appears <50% stenosed. Left Carotid: Velocities in the left ICA are consistent with a 40-59% stenosis.               Hemodynamically significant plaque >50% visualized in the CCA. The               ECA appears >50% stenosed. Vertebrals:  Bilateral vertebral  arteries demonstrate antegrade flow. Subclavians: Normal flow hemodynamics were seen in bilateral subclavian              arteries. *See table(s) above for measurements and observations.  Vascular consult recommended. Electronically signed by Charlton Haws MD on 08/13/2019 at 8:30:34 AM.    Final     Labs Recent Results (from the past 2160 hour(s))  I-STAT creatinine     Status: Abnormal   Collection Time: 08/17/19  5:36 PM  Result Value Ref Range   Creatinine, Ser 1.50 (H) 0.61 - 1.24 mg/dL    Assessment/Plan:  Hypertension blood pressure control important in reducing the progression of atherosclerotic disease. On appropriate oral medications.   Hyperlipidemia lipid control important in reducing the progression of atherosclerotic disease. Continue statin therapy   Carotid stenosis The patient has undergone a CT angiogram which I have independently reviewed.  This demonstrates a near string sign in the right carotid artery with a clearly greater than 80% stenosis in the internal carotid artery above the bifurcation.  There appeared to be a little poststenotic dilatation with mild to moderate calcification and very little tortuosity.  The lesion is fairly high and is up around the C2 level on the right.  His left common carotid artery has a stenosis in the 70% range below the carotid bifurcation.  He has some mild aortic arch disease and a stenotic smaller right vertebral artery as well.  We had a long discussion today regarding treatment options for his carotid stenosis.  Patient currently on aspirin and statin agent.  The should continue.  He clearly has a high-grade stenosis of the right internal carotid artery would benefit from revascularization for stroke risk reduction.  I discussed 2 options for fixing this.  One would be carotid endarterectomy and the other would be carotid stent placement.  Although I think carotid endarterectomy may be technically feasible, it would be a little more  difficult due to the high nature of the lesion in the right carotid artery well above the bifurcation.  He does have a thin neck and I think it may be technically feasible to get above the lesion, but it certainly would not be a straightforward surgery.  With this in mind and with the patient's preference, carotid stenting as discussed as an alternative.  His anatomy is reasonable for carotid stenting although the lesion is fairly tight.  It  does not appear overly calcified, tortuous, and his arch appears to be reasonable.  I will go ahead and start him on Plavix 75 mg daily and he would be on this for at least 5 days before the procedure.  Right carotid stent placement will be planned in the near future at his convenience.  Risks and benefits including stroke risk were all discussed with the patient and his wife and they are agreeable to proceed      Festus Barren 08/28/2019, 9:54 AM   This note was created with Dragon medical transcription system.  Any errors from dictation are unintentional.

## 2019-08-28 NOTE — Assessment & Plan Note (Signed)
The patient has undergone a CT angiogram which I have independently reviewed.  This demonstrates a near string sign in the right carotid artery with a clearly greater than 80% stenosis in the internal carotid artery above the bifurcation.  There appeared to be a little poststenotic dilatation with mild to moderate calcification and very little tortuosity.  The lesion is fairly high and is up around the C2 level on the right.  His left common carotid artery has a stenosis in the 70% range below the carotid bifurcation.  He has some mild aortic arch disease and a stenotic smaller right vertebral artery as well.  We had a long discussion today regarding treatment options for his carotid stenosis.  Patient currently on aspirin and statin agent.  The should continue.  He clearly has a high-grade stenosis of the right internal carotid artery would benefit from revascularization for stroke risk reduction.  I discussed 2 options for fixing this.  One would be carotid endarterectomy and the other would be carotid stent placement.  Although I think carotid endarterectomy may be technically feasible, it would be a little more difficult due to the high nature of the lesion in the right carotid artery well above the bifurcation.  He does have a thin neck and I think it may be technically feasible to get above the lesion, but it certainly would not be a straightforward surgery.  With this in mind and with the patient's preference, carotid stenting as discussed as an alternative.  His anatomy is reasonable for carotid stenting although the lesion is fairly tight.  It does not appear overly calcified, tortuous, and his arch appears to be reasonable.  I will go ahead and start him on Plavix 75 mg daily and he would be on this for at least 5 days before the procedure.  Right carotid stent placement will be planned in the near future at his convenience.  Risks and benefits including stroke risk were all discussed with the patient  and his wife and they are agreeable to proceed

## 2019-08-28 NOTE — Telephone Encounter (Signed)
Spoke with the patient's sister Eber Jones regarding his right carotid stent placement  procedure with Dr. Wyn Quaker on 09/02/19 with a 11:30 am arrival time to the [facility]. Patient will do a phone call pre-op on 08/31/19 between 8-1 pm and covid testing on 09/01/19 between 8-1 pm at the [facility]. Pre-procedure instructions were discussed.

## 2019-08-28 NOTE — Patient Instructions (Signed)
Carotid Angioplasty With Stent Carotid angioplasty with stent is a procedure to open or widen an artery in the neck (carotid artery) that has become narrowed. This is done by inflating a small balloon inside the artery and then placing a small piece of metal that looks like a coil or spring (stent) inside the artery. The stent helps keep the artery open by supporting the artery walls. The carotid arteries supply blood to the brain. When fats, cholesterol, and other materials (plaque) build up in an artery, the artery becomes narrow and can become blocked. This can reduce or block blood flow to certain areas of the brain, which can cause serious health problems, including stroke. Tell a health care provider about:  Any allergies you have.  All medicines you are taking, including vitamins, herbs, eye drops, creams, and over-the-counter medicines.  Any problems you or family members have had with anesthetic medicines.  Any blood disorders you have.  Any surgeries you have had.  Any medical conditions you have.  Whether you are pregnant or may be pregnant. What are the risks? Generally, this is a safe procedure. However, problems may occur, including:  Infection.  Bleeding.  Allergic reactions to medicines or dyes.  Damage to other structures or organs, or to the carotid artery itself.  The carotid artery becoming blocked again.  A collection of blood under the skin (hematoma) around the stent site that gets larger.  A blood clot in another part of the body.  Kidney injury.  Stroke.  Heart attack. What happens before the procedure?  Ask your health care provider about: ? Changing or stopping your regular medicines. This is especially important if you are taking diabetes medicines or blood thinners. ? Whether aspirin is recommended before this procedure. ? Taking over-the-counter medicines, vitamins, herbs, and supplements.  Follow instructions from your health care provider  about eating or drinking restrictions.  Do not use any products that contain nicotine or tobacco for 4 weeks before the procedure. These products include cigarettes, e-cigarettes, and chewing tobacco. If you need help quitting, ask your health care provider.  Ask your health care provider what steps will be taken to help prevent infection. These may include: ? Removing hair at the surgery site. ? Washing skin with a germ-killing soap. ? Taking antibiotic medicine.  You may have blood tests and imaging tests done.  Plan to have someone take you home from the hospital or clinic.  If you will be going home right after the procedure, plan to have someone with you for 24 hours. What happens during the procedure?   An IV will be inserted into one of your veins.  You may be given one or more of the following: ? A medicine to help you relax (sedative). ? A medicine to numb the area where the catheter will be inserted (local anesthetic).  Most commonly, an incision will be made in your groin. In some cases, an incision may be made in your wrist or forearm instead of your groin.  A small, thin tube (catheter) will be inserted through your incision, into an artery. The catheter will be threaded upward into your carotid artery. An X-ray machine (fluoroscope) will help your health care provider guide the catheter to the correct place in your artery.  Dye will be injected into the catheter and will travel to the narrow or blocked part of your carotid artery.  X-ray images will be taken of how the dye flows through your artery. While the images   are being taken, you may be given instructions about breathing, swallowing, moving, or talking.  A filter (distal protection device) will be inserted into your artery. This will be used to catch plaque that comes loose in your artery during the procedure. This reduces the risk of plaque moving into your brain.  A small balloon will be inserted into your  artery. The balloon will be inflated for a few seconds to widen your artery and will then be removed.  The stent will be placed in your artery.  A second small balloon will be inserted into your artery and inflated. This expands the stent inside of your artery so that the stent holds up the artery walls. The balloon will then be removed.  The catheter and the distal protection device will be removed from your artery.  Your incision may be closed with stitches (sutures), skin glue, or adhesive tape.  A bandage (dressing) will be placed over your incision. The procedure may vary among health care providers and hospitals. What happens after the procedure?  Your blood pressure, heart rate, breathing rate, and blood oxygen level will be monitored until you leave the hospital or clinic.  You may continue to receive fluids and medicines through an IV.  You may need to have pressure placed on the incision site to prevent bleeding.  You will need to keep the area still for a few hours, or as long as directed by your health care provider. If the procedure was done in the groin, you will be instructed not to bend or cross your legs.  You may have some pain. Pain medicines will be available to help you.  You may have a test that uses sound waves to take pictures (ultrasound) of the carotid artery. This can be compared to future tests to check for changes in the artery.  Do not drive for 24 hours. Summary  Carotid angioplasty with stent is a procedure to open or widen an artery in the neck (carotid artery) that has become narrowed.  The procedure is done to lower the risk of problems that can result from reduced blood flow to the brain, including a stroke.  The stent placed inside the artery will help keep the artery open by supporting the artery walls.  Follow instructions from your health care provider about taking medicines and about eating and drinking before the procedure. This  information is not intended to replace advice given to you by your health care provider. Make sure you discuss any questions you have with your health care provider. Document Revised: 02/25/2018 Document Reviewed: 02/20/2018 Elsevier Patient Education  2020 [employer]  

## 2019-08-28 NOTE — Assessment & Plan Note (Signed)
blood pressure control important in reducing the progression of atherosclerotic disease. On appropriate oral medications.  

## 2019-08-28 NOTE — Assessment & Plan Note (Signed)
lipid control important in reducing the progression of atherosclerotic disease. Continue statin therapy  

## 2019-08-31 ENCOUNTER — Encounter: Admission: RE | Admit: 2019-08-31 | Payer: Medicare Other | Source: Ambulatory Visit

## 2019-09-01 ENCOUNTER — Other Ambulatory Visit
Admission: RE | Admit: 2019-09-01 | Discharge: 2019-09-01 | Disposition: A | Discharge status: Home / Self Care | Payer: Medicare Other | Source: Ambulatory Visit | Attending: Vascular Surgery | Admitting: Vascular Surgery

## 2019-09-01 ENCOUNTER — Other Ambulatory Visit (INDEPENDENT_AMBULATORY_CARE_PROVIDER_SITE_OTHER): Payer: Self-pay | Admitting: Nurse Practitioner

## 2019-09-01 ENCOUNTER — Other Ambulatory Visit: Payer: Self-pay

## 2019-09-02 ENCOUNTER — Inpatient Hospital Stay
Admission: RE | Admit: 2019-09-02 | Discharge: 2019-09-03 | DRG: 036 | Disposition: A | Discharge status: Home / Self Care | Payer: Medicare Other | Attending: Vascular Surgery | Admitting: Vascular Surgery

## 2019-09-02 ENCOUNTER — Encounter: Payer: Self-pay | Admitting: Vascular Surgery

## 2019-09-02 ENCOUNTER — Other Ambulatory Visit: Payer: Self-pay

## 2019-09-02 ENCOUNTER — Encounter
Admission: RE | Disposition: A | Discharge status: Home / Self Care | Payer: Self-pay | Source: Home / Self Care | Attending: Vascular Surgery

## 2019-09-02 DIAGNOSIS — I10 Essential (primary) hypertension: Secondary | ICD-10-CM | POA: Diagnosis present

## 2019-09-02 DIAGNOSIS — E785 Hyperlipidemia, unspecified: Secondary | ICD-10-CM | POA: Diagnosis present

## 2019-09-02 DIAGNOSIS — I708 Atherosclerosis of other arteries: Secondary | ICD-10-CM | POA: Diagnosis present

## 2019-09-02 DIAGNOSIS — I6523 Occlusion and stenosis of bilateral carotid arteries: Secondary | ICD-10-CM | POA: Diagnosis present

## 2019-09-02 DIAGNOSIS — Z20822 Contact with and (suspected) exposure to covid-19: Secondary | ICD-10-CM | POA: Diagnosis present

## 2019-09-02 DIAGNOSIS — I252 Old myocardial infarction: Secondary | ICD-10-CM

## 2019-09-02 DIAGNOSIS — I70201 Unspecified atherosclerosis of native arteries of extremities, right leg: Secondary | ICD-10-CM | POA: Diagnosis present

## 2019-09-02 DIAGNOSIS — I6521 Occlusion and stenosis of right carotid artery: Secondary | ICD-10-CM | POA: Diagnosis not present

## 2019-09-02 DIAGNOSIS — I2510 Atherosclerotic heart disease of native coronary artery without angina pectoris: Secondary | ICD-10-CM | POA: Diagnosis present

## 2019-09-02 HISTORY — PX: CAROTID PTA/STENT INTERVENTION: CATH118231

## 2019-09-02 LAB — BUN: BUN: 23 mg/dL (ref 8–23)

## 2019-09-02 LAB — MRSA PCR SCREENING: MRSA by PCR: NEGATIVE

## 2019-09-02 LAB — CREATININE, SERUM
Creatinine, Ser: 1.4 mg/dL — ABNORMAL HIGH (ref 0.61–1.24)
GFR calc Af Amer: 55 mL/min — ABNORMAL LOW (ref >60)
GFR calc non Af Amer: 47 mL/min — ABNORMAL LOW (ref >60)

## 2019-09-02 LAB — POCT ACTIVATED CLOTTING TIME: Activated Clotting Time: 230 seconds

## 2019-09-02 LAB — SARS CORONAVIRUS 2 (TAT 6-24 HRS): SARS Coronavirus 2: NEGATIVE

## 2019-09-02 SURGERY — CAROTID PTA/STENT INTERVENTION
Anesthesia: Moderate Sedation | Laterality: Right

## 2019-09-02 MED ORDER — FENTANYL CITRATE (PF) 100 MCG/2ML IJ SOLN
INTRAMUSCULAR | Status: DC | PRN
  Administered 2019-09-02: 50 ug via INTRAVENOUS

## 2019-09-02 MED ORDER — ATORVASTATIN CALCIUM 20 MG PO TABS
80.0000 mg | ORAL_TABLET | Freq: Every day | ORAL | Status: DC
  Administered 2019-09-02: 80 mg via ORAL
  Filled 2019-09-02: qty 4

## 2019-09-02 MED ORDER — CLOPIDOGREL BISULFATE 75 MG PO TABS
75.0000 mg | ORAL_TABLET | Freq: Every day | ORAL | Status: DC
  Administered 2019-09-03: 75 mg via ORAL
  Filled 2019-09-02: qty 1

## 2019-09-02 MED ORDER — MIDAZOLAM HCL 2 MG/2ML IJ SOLN
INTRAMUSCULAR | Status: DC | PRN
  Administered 2019-09-02: 2 mg via INTRAVENOUS

## 2019-09-02 MED ORDER — ASPIRIN EC 81 MG PO TBEC
81.0000 mg | DELAYED_RELEASE_TABLET | Freq: Every day | ORAL | Status: DC
  Administered 2019-09-03: 81 mg via ORAL
  Filled 2019-09-02: qty 1

## 2019-09-02 MED ORDER — ASPIRIN EC 81 MG PO TBEC: 81.0000 mg | DELAYED_RELEASE_TABLET | Freq: Every day | ORAL | Status: DC

## 2019-09-02 MED ORDER — VANCOMYCIN HCL IN DEXTROSE 1-5 GM/200ML-% IV SOLN
1000.0000 mg | Freq: Two times a day (BID) | INTRAVENOUS | Status: AC
  Administered 2019-09-02 – 2019-09-03 (×2): 1000 mg via INTRAVENOUS
  Filled 2019-09-02 (×2): qty 200

## 2019-09-02 MED ORDER — GUAIFENESIN-DM 100-10 MG/5ML PO SYRP: 15.0000 mL | ORAL_SOLUTION | ORAL | Status: DC | PRN

## 2019-09-02 MED ORDER — HEPARIN SODIUM (PORCINE) 1000 UNIT/ML IJ SOLN
INTRAMUSCULAR | Status: AC
  Filled 2019-09-02: qty 1

## 2019-09-02 MED ORDER — HYDRALAZINE HCL 20 MG/ML IJ SOLN: 5.0000 mg | INTRAMUSCULAR | Status: DC | PRN

## 2019-09-02 MED ORDER — SODIUM CHLORIDE 0.9 % IV SOLN: 500.0000 mL | Freq: Once | INTRAVENOUS | Status: DC | PRN

## 2019-09-02 MED ORDER — HEPARIN SODIUM (PORCINE) 1000 UNIT/ML IJ SOLN
INTRAMUSCULAR | Status: DC | PRN
  Administered 2019-09-02: 7000 [IU] via INTRAVENOUS
  Administered 2019-09-02: 3000 [IU] via INTRAVENOUS

## 2019-09-02 MED ORDER — MAGNESIUM SULFATE 2 GM/50ML IV SOLN: 2.0000 g | Freq: Every day | INTRAVENOUS | Status: DC | PRN

## 2019-09-02 MED ORDER — ONDANSETRON HCL 4 MG/2ML IJ SOLN: 4.0000 mg | Freq: Four times a day (QID) | INTRAMUSCULAR | Status: DC | PRN

## 2019-09-02 MED ORDER — DIPHENHYDRAMINE HCL 50 MG/ML IJ SOLN: 50.0000 mg | Freq: Once | INTRAMUSCULAR | Status: DC | PRN

## 2019-09-02 MED ORDER — PHENYLEPHRINE HCL (PRESSORS) 10 MG/ML IV SOLN
INTRAVENOUS | Status: AC
  Filled 2019-09-02: qty 1

## 2019-09-02 MED ORDER — SODIUM CHLORIDE 0.9 % IV SOLN: INTRAVENOUS | Status: DC

## 2019-09-02 MED ORDER — CLOPIDOGREL BISULFATE 75 MG PO TABS: 75.0000 mg | ORAL_TABLET | Freq: Every day | ORAL | Status: DC

## 2019-09-02 MED ORDER — METOPROLOL TARTRATE 25 MG PO TABS
25.0000 mg | ORAL_TABLET | Freq: Two times a day (BID) | ORAL | Status: DC
  Administered 2019-09-02 – 2019-09-03 (×2): 25 mg via ORAL
  Filled 2019-09-02 (×2): qty 1

## 2019-09-02 MED ORDER — ATROPINE SULFATE 1 MG/10ML IJ SOSY
PREFILLED_SYRINGE | INTRAMUSCULAR | Status: AC
  Filled 2019-09-02: qty 10

## 2019-09-02 MED ORDER — VITAMIN D 25 MCG (1000 UNIT) PO TABS
1000.0000 [IU] | ORAL_TABLET | Freq: Every day | ORAL | Status: DC
  Administered 2019-09-02 – 2019-09-03 (×2): 1000 [IU] via ORAL
  Filled 2019-09-02 (×2): qty 1

## 2019-09-02 MED ORDER — FENTANYL CITRATE (PF) 100 MCG/2ML IJ SOLN
INTRAMUSCULAR | Status: AC
  Filled 2019-09-02: qty 2

## 2019-09-02 MED ORDER — MORPHINE SULFATE (PF) 2 MG/ML IV SOLN: 2.0000 mg | INTRAVENOUS | Status: DC | PRN

## 2019-09-02 MED ORDER — DOPAMINE-DEXTROSE 3.2-5 MG/ML-% IV SOLN
INTRAVENOUS | Status: AC
  Filled 2019-09-02: qty 250

## 2019-09-02 MED ORDER — ATROPINE SULFATE 1 MG/ML IJ SOLN
INTRAMUSCULAR | Status: DC | PRN
  Administered 2019-09-02: 1 mg via INTRAVENOUS

## 2019-09-02 MED ORDER — NITROGLYCERIN 0.4 MG SL SUBL: 0.4000 mg | SUBLINGUAL_TABLET | SUBLINGUAL | Status: DC | PRN

## 2019-09-02 MED ORDER — MIDAZOLAM HCL 5 MG/5ML IJ SOLN
INTRAMUSCULAR | Status: AC
  Filled 2019-09-02: qty 5

## 2019-09-02 MED ORDER — ALUM & MAG HYDROXIDE-SIMETH 200-200-20 MG/5ML PO SUSP: 15.0000 mL | ORAL | Status: DC | PRN

## 2019-09-02 MED ORDER — PHENOL 1.4 % MT LIQD
1.0000 | OROMUCOSAL | Status: DC | PRN
  Filled 2019-09-02: qty 177

## 2019-09-02 MED ORDER — IODIXANOL 320 MG/ML IV SOLN
INTRAVENOUS | Status: DC | PRN
  Administered 2019-09-02: 60 mL via INTRA_ARTERIAL

## 2019-09-02 MED ORDER — LABETALOL HCL 5 MG/ML IV SOLN: 10.0000 mg | INTRAVENOUS | Status: DC | PRN

## 2019-09-02 MED ORDER — VITAMIN B-12 1000 MCG PO TABS
1000.0000 ug | ORAL_TABLET | Freq: Every day | ORAL | Status: DC
  Administered 2019-09-02 – 2019-09-03 (×2): 1000 ug via ORAL
  Filled 2019-09-02 (×2): qty 1

## 2019-09-02 MED ORDER — ACETAMINOPHEN 325 MG PO TABS: 325.0000 mg | ORAL_TABLET | ORAL | Status: DC | PRN

## 2019-09-02 MED ORDER — METOPROLOL TARTRATE 5 MG/5ML IV SOLN: 2.0000 mg | INTRAVENOUS | Status: DC | PRN

## 2019-09-02 MED ORDER — FAMOTIDINE 20 MG PO TABS: 40.0000 mg | ORAL_TABLET | Freq: Once | ORAL | Status: DC | PRN

## 2019-09-02 MED ORDER — METHYLPREDNISOLONE SODIUM SUCC 125 MG IJ SOLR: 125.0000 mg | Freq: Once | INTRAMUSCULAR | Status: DC | PRN

## 2019-09-02 MED ORDER — FAMOTIDINE IN NACL 20-0.9 MG/50ML-% IV SOLN
20.0000 mg | Freq: Two times a day (BID) | INTRAVENOUS | Status: DC
  Administered 2019-09-02 – 2019-09-03 (×2): 20 mg via INTRAVENOUS
  Filled 2019-09-02 (×2): qty 50

## 2019-09-02 MED ORDER — ACETAMINOPHEN 650 MG RE SUPP: 325.0000 mg | RECTAL | Status: DC | PRN

## 2019-09-02 MED ORDER — POTASSIUM CHLORIDE CRYS ER 20 MEQ PO TBCR: 20.0000 meq | EXTENDED_RELEASE_TABLET | Freq: Every day | ORAL | Status: DC | PRN

## 2019-09-02 MED ORDER — OXYCODONE-ACETAMINOPHEN 5-325 MG PO TABS: 1.0000 | ORAL_TABLET | ORAL | Status: DC | PRN

## 2019-09-02 MED ORDER — CEFAZOLIN SODIUM-DEXTROSE 2-4 GM/100ML-% IV SOLN
2.0000 g | Freq: Three times a day (TID) | INTRAVENOUS | Status: AC
  Administered 2019-09-02 – 2019-09-03 (×2): 2 g via INTRAVENOUS
  Filled 2019-09-02 (×2): qty 100

## 2019-09-02 MED ORDER — MIDAZOLAM HCL 2 MG/ML PO SYRP: 8.0000 mg | ORAL_SOLUTION | Freq: Once | ORAL | Status: DC | PRN

## 2019-09-02 MED ORDER — CEFAZOLIN SODIUM-DEXTROSE 2-4 GM/100ML-% IV SOLN
2.0000 g | Freq: Once | INTRAVENOUS | Status: AC
  Administered 2019-09-02: 2 g via INTRAVENOUS

## 2019-09-02 MED ORDER — HYDROMORPHONE HCL 1 MG/ML IJ SOLN: 1.0000 mg | Freq: Once | INTRAMUSCULAR | Status: DC | PRN

## 2019-09-02 SURGICAL SUPPLY — 21 items
BALLN VIATRAC 5X20X135 (BALLOONS) ×3
BALLOON VIATRAC 5X20X135 (BALLOONS) ×1 IMPLANT
CATH ANGIO 5F 100CM .035 PIG (CATHETERS) ×3 IMPLANT
CATH BEACON 5 .035 100 H1 TIP (CATHETERS) ×3 IMPLANT
DEVICE EMBOSHIELD NAV6 4.0-7.0 (FILTER) ×3 IMPLANT
DEVICE PRESTO INFLATION (MISCELLANEOUS) ×3 IMPLANT
DEVICE STARCLOSE SE CLOSURE (Vascular Products) ×3 IMPLANT
DEVICE TORQUE .025-.038 (MISCELLANEOUS) ×3 IMPLANT
GLIDEWIRE ANGLED SS 035X260CM (WIRE) ×3 IMPLANT
KIT CAROTID MANIFOLD (MISCELLANEOUS) ×6 IMPLANT
NEEDLE ENTRY 21GA 7CM ECHOTIP (NEEDLE) ×3 IMPLANT
PACK ANGIOGRAPHY (CUSTOM PROCEDURE TRAY) ×3 IMPLANT
SET INTRO CAPELLA COAXIAL (SET/KITS/TRAYS/PACK) ×3 IMPLANT
SHEATH BRITE TIP 5FRX11 (SHEATH) IMPLANT
SHEATH BRITE TIP 6FRX11 (SHEATH) ×3 IMPLANT
SHEATH SHUTTLE SELECT 6F (SHEATH) ×3 IMPLANT
STENT XACT CAR 10-8X30X136 (Permanent Stent) ×3 IMPLANT
SYR MEDRAD MARK 7 150ML (SYRINGE) ×3 IMPLANT
SYR MEDRAD MARK V 150ML (SYRINGE) ×3 IMPLANT
WIRE G VAS 035X260 STIFF (WIRE) ×3 IMPLANT
WIRE J 3MM .035X145CM (WIRE) ×3 IMPLANT

## 2019-09-02 NOTE — Op Note (Signed)
OPERATIVE NOTE DATE: 09/02/2019  PROCEDURE: 1.  Ultrasound guidance for vascular access right femoral artery 2.  Placement of a 10 mm x 8 mm 3 cm length Exact stent with the use of the NAV-6 embolic protection device in the right internal carotid artery  PRE-OPERATIVE DIAGNOSIS: 1. High grade right carotid artery stenosis. 2. 70% left common carotid stenosis  POST-OPERATIVE DIAGNOSIS:  Same as above with right carotid stenosis measuring approximately 90% in the left carotid stenosis being 80% or greater.  SURGEON: Festus Barren, MD  ASSISTANT(S):  Levora Dredge, MD  ANESTHESIA: local/MCS  ESTIMATED BLOOD LOSS:  25 cc  CONTRAST: 60 cc  FLUORO TIME: 4 minutes  MODERATE CONSCIOUS SEDATION TIME:  Approximately 45 minutes using 2 mg of Versed and 50 mcg of Fentanyl  FINDING(S): 1.   90% right internal carotid artery stenosis about 2 cm above the carotid bifurcation.  On the original thoracic aortogram were able to identify that the left carotid artery stenosis worse than had been felt from the CT scan and was clearly greater than 80%.  SPECIMEN(S):   none  INDICATIONS:   Patient is a 79 y.o. male who presents with high-grade right carotid artery stenosis.  The patient has a relatively high cervical bifurcation but a lesion is well into the internal carotid artery would be difficult or impossible to get to with surgical approach and carotid artery stenting was felt to be preferred to endarterectomy for that reason.  Risks and benefits were discussed and informed consent was obtained. An assistant was present during the procedure to help facilitate the exposure and expedite the procedure.  DESCRIPTION: After obtaining full informed written consent, the patient was brought back to the vascular suite and placed supine upon the table.  The patient received IV antibiotics prior to induction. Moderate conscious sedation was administered during a face to face encounter with the patient  throughout the procedure with my supervision of the RN administering medicines and monitoring the patients vital signs and mental status throughout from the start of the procedure until the patient was taken to the recovery room.  After obtaining adequate anesthesia, the patient was prepped and draped in the standard fashion. The assistant provided retraction and mobilization to help facilitate exposure and expedite the procedure throughout the entire procedure.  This included following suture, using retractors, and optimizing lighting.  The right femoral artery was visualized with ultrasound and found to be widely patent. It was then accessed under direct ultrasound guidance without difficulty with a Seldinger needle. A permanent image was recorded. A J-wire was placed and we then placed a 6 French sheath. The patient was then heparinized and a total of 89211 units of intravenous heparin were given and an ACT was checked to confirm successful anticoagulation. A pigtail catheter was then placed into the ascending aorta. This showed a type I aortic arch with no proximal stenosis. This did show the left common carotid artery stenosis to be much worse than had previously been felt from the CT scan and was clearly greater than 80% even without a selective image. The most significant portion of the stenosis was proximal to the carotid bifurcation although it did extend into the left internal carotid artery as well. The left vertebral artery appeared to be large and dominant. I then selectively cannulated the innominate artery and then the right common carotid artery without difficulty with a H1 catheter.  Cervical and cerebral carotid angiography was then performed. There were no obvious intracranial filling  defects with surprisingly brisk right to left cross-filling. The carotid bifurcation demonstrated a 90 to 95% stenosis 2 cm above the origin of the internal carotid artery at the angle of the jaw that would have been  very difficult to get to surgically.  I then advanced into the external carotid artery with a Glidewire and the H1 catheter and then exchanged for the Amplatz Super Stiff wire. Over the Amplatz Super Stiff wire, a 6 [place] shuttle sheath was placed into the mid common carotid artery. I then used the NAV-6  Embolic protection device and crossed the lesion and parked this in the distal internal carotid artery at the base of the skull.  I then selected a 10 mm proximal, 8 mm distal, 3 cm long exact stent. This was deployed across the lesion encompassing it in its entirety and the internal carotid artery. A 5 mm diameter by 2 cm length balloon was used to post dilate the stent. Only about a 10-15% residual stenosis was present after angioplasty. Completion angiogram showed normal intracranial filling without new defects. At this point I elected to terminate the procedure. The sheath was removed and StarClose closure device was deployed in the right femoral artery with excellent hemostatic result. Incidental finding of at least moderate right iliac artery stenosis and right common femoral stenosis was found on this image. The patient was taken to the recovery room in stable condition having tolerated the procedure well.  COMPLICATIONS: none  CONDITION: stable  Thomas Sexton 09/02/2019 2:40 PM   This note was created with Dragon Medical transcription system. Any errors in dictation are purely unintentional.

## 2019-09-02 NOTE — H&P (Signed)
[facility] History & Physical Update  The patient was interviewed and re-examined.  The patient's previous History and Physical has been reviewed and is unchanged.  There is no change in the plan of care. We plan to proceed with the scheduled procedure.  Festus Barren, MD  09/02/2019, 12:37 PM

## 2019-09-02 NOTE — Progress Notes (Signed)
Patient remains AOx4.  Peripheral pulses stable and WDL. His neuro checks have been performed q2 hrs since his arrival to the unit.  I deflated his PAD per clinical practice protocol and observed blood stream form the inguinal site.  I reintroduced the 40cc of air into the PAD and the site has maintained a clot.  The blood drainage leaked to the bottom posterior portion of the PAD.  Night shift RN observed and aware during hand off.  Patient tried to urinate in urinal but could.  I offered to get him up form bed but he said he would rather rest and try standing later.  Patient receiving NS to his left hand after his right arm IV infiltrated post-procedure. He has edema to his right upper arm around the olecranal d/t this fluid infiltration. His vitals are WDL.

## 2019-09-03 ENCOUNTER — Encounter: Payer: Self-pay | Admitting: Cardiology

## 2019-09-03 DIAGNOSIS — I6521 Occlusion and stenosis of right carotid artery: Secondary | ICD-10-CM

## 2019-09-03 LAB — BASIC METABOLIC PANEL
Anion gap: 8 (ref 5–15)
BUN: 19 mg/dL (ref 8–23)
CO2: 23 mmol/L (ref 22–32)
Calcium: 8.7 mg/dL — ABNORMAL LOW (ref 8.9–10.3)
Chloride: 111 mmol/L (ref 98–111)
Creatinine, Ser: 1.28 mg/dL — ABNORMAL HIGH (ref 0.61–1.24)
GFR calc Af Amer: >60 mL/min (ref >60)
GFR calc non Af Amer: 53 mL/min — ABNORMAL LOW (ref >60)
Glucose, Bld: 107 mg/dL — ABNORMAL HIGH (ref 70–99)
Potassium: 3.7 mmol/L (ref 3.5–5.1)
Sodium: 142 mmol/L (ref 135–145)

## 2019-09-03 LAB — CBC
HCT: 32.5 % — ABNORMAL LOW (ref 39.0–52.0)
Hemoglobin: 10.2 g/dL — ABNORMAL LOW (ref 13.0–17.0)
MCH: 30.1 pg (ref 26.0–34.0)
MCHC: 31.4 g/dL (ref 30.0–36.0)
MCV: 95.9 fL (ref 80.0–100.0)
Platelets: 177 10*3/uL (ref 150–400)
RBC: 3.39 MIL/uL — ABNORMAL LOW (ref 4.22–5.81)
RDW: 12 % (ref 11.5–15.5)
WBC: 7.2 10*3/uL (ref 4.0–10.5)
nRBC: 0 % (ref 0.0–0.2)

## 2019-09-03 NOTE — Discharge Instructions (Signed)
Vascular Surgery Discharge Instructions: 1) you may shower as above tomorrow (Friday). 2) no strenuous activity or lifting heavier than 10 pounds for 3 weeks.

## 2019-09-03 NOTE — Plan of Care (Signed)
Continue with current plan of care. Pt is oriented with periodic episodes of confusion. Will continue to provide education with each patient interaction. Expected discharge later today per Vascular team.

## 2019-09-03 NOTE — Discharge Summary (Signed)
[facility]    Discharge Summary  Patient ID:  IZZAK Sexton MRN: [id number] DOB/AGE: 1941/04/11 79 y.o.  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 CAD (coronary artery disease)    a. 09/2012 Acute STEMI/Cath/PCI: LM nl, LAD 61m, 30d, D1 50, LCX mild prox plaque, OM1 100 (2.5x16 Promus Premier DES), RCA dom, 20m, PDA 30p;  b. 09/2012 Echo: EF 55-60%, PASP .  Marland Kitchen HTN (hypertension)   . Hyperlipidemia   . Tobacco abuse    Procedure(s): 09/02/10: 1.  Ultrasound guidance for vascular access right femoral artery 2.  Placement of a 10 mm x 8 mm 3 cm length Exact stent with the use of the NAV-6 embolic protection device in the right internal carotid artery  Discharged Condition: Good  HPI / Hospital Course:  Patient is a 79 y.o. male who presents with high-grade right carotid artery stenosis.  The patient has a relatively high cervical bifurcation but a lesion is well into the internal carotid artery would be difficult or impossible to get to with surgical approach and carotid artery stenting was felt to be preferred to endarterectomy for that reason.  On 09/01/20, the patient underwent:  Ultrasound guidance for vascular access right femoral artery  Placement of a 10 mm x 8 mm 3 cm length Exact stent with the use of  the NAV-6 embolic protection device in the right internal carotid artery  The patient procedure well was transferred from the radiology suite to the ICU for observation overnight.  Patient's procedure was unremarkable.  During the patient's brief stay, his diet was advanced he was urinating independently, his pain was controlled with the use of p.o. pain medication he  was ambulating at baseline.  At discharge, patient was afebrile with vital stable signs and unremarkable exam.  Procedure(s):  Physical exam:  Alert and oriented x3, no acute distress Face: Symmetrical Neck: Trachea midline Cardiovascular: Regular rate and rhythm Pulmonary: Clear to auscultation bilaterally Abdomen: Soft, nontender, positive bowel sounds Right groin:  Access site: Clean dry intact.  No swelling or drainage. Extremity: Warm distally toes, no edema Neuro: Intact, no deficits noted  Labs as below  Complications: None  Consults: None  Significant Diagnostic Studies: CBC Lab Results  Component Value Date   WBC 7.2 09/03/2019   HGB 10.2 (L) 09/03/2019   HCT 32.5 (L) 09/03/2019   MCV 95.9 09/03/2019   PLT 177 09/03/2019   BMET    Component Value Date/Time   NA 142 09/03/2019 0429   NA 136 09/30/2012 1623   K 3.7 09/03/2019 0429   K 4.3 09/30/2012 1623   CL 111 09/03/2019 0429   CL 103 09/30/2012 1623   CO2 23 09/03/2019 0429   CO2 27 09/30/2012 1623   GLUCOSE 107 (H) 09/03/2019 0429   GLUCOSE 131 (H) 09/30/2012 1623   BUN 19 09/03/2019 0429   BUN 23 (H) 09/30/2012 1623   CREATININE 1.28 (H) 09/03/2019 0429   CREATININE 1.92 (H) 09/30/2012 1623   CALCIUM 8.7 (L) 09/03/2019 0429   CALCIUM 9.4 09/30/2012 1623   GFRNONAA 53 (L) 09/03/2019 0429   GFRNONAA 34 (L) 09/30/2012 1623   GFRAA >60 09/03/2019 0429   GFRAA 39 (L) 09/30/2012 1623   COAG No results found for: INR,  PROTIME  Disposition:  Discharge to :Home  Allergies as of 09/03/2019   No Known Allergies     Medication List    TAKE these medications   aspirin 81 MG EC tablet Take 1 tablet (81 mg total) by mouth daily.   atorvastatin 80 MG tablet Commonly known as: LIPITOR TAKE 1 TABLET BY MOUTH EVERY DAY AT 6 PM   cholecalciferol 1000 units tablet Commonly known as: VITAMIN D Take 1,000 Units by mouth daily.   clopidogrel 75 MG tablet Commonly known as: PLAVIX Take 75 mg by  mouth daily.   metoprolol tartrate 25 MG tablet Commonly known as: LOPRESSOR TAKE 1 TABLET BY MOUTH TWICE DAILY   nitroGLYCERIN 0.4 MG SL tablet Commonly known as: NITROSTAT Place 1 tablet (0.4 mg total) under the tongue every 5 (five) minutes x 3 doses as needed for chest pain.   vitamin B-12 1000 MCG tablet Commonly known as: CYANOCOBALAMIN Take 1,000 mcg by mouth daily.      Verbal and written Discharge instructions given to the patient. Wound care per Discharge AVS Follow-up Information    Kris Hartmann, NP Follow up in 2 week(s).   Specialty: Vascular Surgery Why: Incision check. First post-op. No studies needed.  Contact information: [place] [address] [phone]          Signed: Sela Hua, PA-C  09/03/2019, 10:49 AM

## 2019-09-03 NOTE — Plan of Care (Signed)
Discharged to home via private vehicle with sister present to assist

## 2019-10-01 ENCOUNTER — Other Ambulatory Visit: Payer: Self-pay

## 2019-10-01 ENCOUNTER — Ambulatory Visit (INDEPENDENT_AMBULATORY_CARE_PROVIDER_SITE_OTHER): Payer: Medicare Other | Admitting: Nurse Practitioner

## 2019-10-01 ENCOUNTER — Encounter (INDEPENDENT_AMBULATORY_CARE_PROVIDER_SITE_OTHER): Payer: Self-pay | Admitting: Nurse Practitioner

## 2019-10-01 VITALS — BP 154/72 | HR 66 | Ht 68.0 in | Wt 142.0 lb

## 2019-10-01 DIAGNOSIS — I10 Essential (primary) hypertension: Secondary | ICD-10-CM

## 2019-10-01 DIAGNOSIS — I6523 Occlusion and stenosis of bilateral carotid arteries: Secondary | ICD-10-CM

## 2019-10-03 ENCOUNTER — Encounter (INDEPENDENT_AMBULATORY_CARE_PROVIDER_SITE_OTHER): Payer: Self-pay | Admitting: Nurse Practitioner

## 2019-10-03 NOTE — Progress Notes (Signed)
Subjective:    Patient ID: Thomas Sexton, male    DOB: April 09, 1941, 79 y.o.   MRN: [id number] Chief Complaint  Patient presents with  . Follow-up    2 week post Carotid PAT/Stent  incision check     Patient presents today to follow-up after carotid stenting.  The patient states that the wound has healed completely.  He feels perfectly fine he has been preserving most of his everyday activities.  He denies any headache.  He denies any fever, chills, nausea, vomiting or diarrhea.  Patient is taking his Plavix and aspirin without any difficulties.  The patient underwent:  PROCEDURE: 1.  Ultrasound guidance for vascular access right femoral artery 2.  Placement of a 10 mm x 8 mm 3 cm length Exact stent with the use of the NAV-6 embolic protection device in the right internal carotid artery   Overall the patient states that he is doing well at this time.   Review of Systems  Musculoskeletal: Positive for arthralgias.  All other systems reviewed and are negative.      Objective:   Physical Exam Vitals reviewed.  Cardiovascular:     Rate and Rhythm: Normal rate and regular rhythm.     Pulses: Normal pulses.     Heart sounds: Normal heart sounds.  Pulmonary:     Effort: Pulmonary effort is normal.     Breath sounds: Normal breath sounds.  Musculoskeletal:     Cervical back: Normal range of motion.  Skin:    General: Skin is warm and dry.  Neurological:     Mental Status: He is alert and oriented to person, place, and time.  Psychiatric:        Mood and Affect: Mood normal.        Behavior: Behavior normal.        Thought Content: Thought content normal.     BP (!) 154/72   Pulse 66   Ht 5\' 8"  (1.727 m)   Wt 142 lb (64.4 kg)   BMI 21.59 kg/m   Past Medical History:  Diagnosis Date  . CAD (coronary artery disease)    a. 09/2012 Acute STEMI/Cath/PCI: LM nl, LAD 60m, 30d, D1 50, LCX mild prox plaque, OM1 100 (2.5x16 Promus Premier DES), RCA dom, 41m, PDA 30p;  b.  09/2012 Echo: EF 55-60%, PASP 10/2012.  HTN (hypertension)   . Hyperlipidemia   . Tobacco abuse     Social History   Socioeconomic History  . Marital status: Single    Spouse name: Not on file  . Number of children: Not on file  . Years of education: Not on file  . Highest education level: Not on file  Occupational History  . Not on file  Tobacco Use  . Smoking status: Former Smoker    Packs/day: 0.50    Years: 50.00    Pack years: 25.00    Types: Cigarettes    Quit date: 09/27/2012    Years since quitting: 7.0  . Smokeless tobacco: Never Used  Substance and Sexual Activity  . Alcohol use: No  . Drug use: No  . Sexual activity: Not on file  Other Topics Concern  . Not on file  Social History Narrative  . Not on file   Social Determinants of Health   Financial Resource Strain:   . Difficulty of Paying Living Expenses:   Food Insecurity:   . Worried About 09/29/2012 in the Last Year:   . Ran  Out of Food in the Last Year:   Transportation Needs:   . Lack of Transportation (Medical):   Marland Kitchen Lack of Transportation (Non-Medical):   Physical Activity:   . Days of Exercise per Week:   . Minutes of Exercise per Session:   Stress:   . Feeling of Stress :   Social Connections:   . Frequency of Communication with Friends and Family:   . Frequency of Social Gatherings with Friends and Family:   . Attends Religious Services:   . Active Member of Clubs or Organizations:   . Attends Archivist Meetings:   Marland Kitchen Marital Status:   Intimate Partner Violence:   . Fear of Current or Ex-Partner:   . Emotionally Abused:   Marland Kitchen Physically Abused:   . Sexually Abused:     Past Surgical History:  Procedure Laterality Date  . CARDIAC CATHETERIZATION    . CAROTID PTA/STENT INTERVENTION Right 09/02/2019   Procedure: CAROTID PTA/STENT INTERVENTION;  Surgeon: Algernon Huxley, MD;  Location: [facility];  Service: Cardiovascular;  Laterality: Right;  . CORONARY  ANGIOPLASTY  09/2012   a. 09/2012 Acute STEMI/Cath/PCI: LM nl, LAD 36m, 30d, D1 50, LCX mild prox plaque, OM1 100 (2.5x16 Promus Premier DES), RCA dom, 47m, PDA 30p;  b. 09/2012 Echo: EF 55-60%, PASP 64mmHg.  Marland Kitchen LEFT HEART CATH  09/30/2012   Procedure: LEFT HEART CATH;  Surgeon: Burnell Blanks, MD;  Location: [facility] CATH LAB;  Service: Cardiovascular;;  . LEFT HEART CATHETERIZATION WITH CORONARY ANGIOGRAM N/A 09/30/2012   Procedure: STEMI;  Surgeon: Burnell Blanks, MD;  Location: [facility] CATH LAB;  Service: Cardiovascular;  Laterality: N/A;  . PERCUTANEOUS CORONARY STENT INTERVENTION (PCI-S)  09/30/2012   Procedure: PERCUTANEOUS CORONARY STENT INTERVENTION (PCI-S);  Surgeon: Burnell Blanks, MD;  Location: [facility] CATH LAB;  Service: Cardiovascular;;    Family History  Problem Relation Age of Onset  . Heart attack Mother   . Hypertension Father   . Hyperlipidemia Father     No Known Allergies     Assessment & Plan:   1. Carotid stenosis, bilateral Patient doing well post carotid stenting.  Patient advised that he can return to most of his normal everyday activities.  Patient is advised that he should not attempt extremely strenuous exercise however proceeding with lifting weights under 50 pounds exercise and other activities are tolerable.  Provided no issues patient will follow up in 6 to 8 weeks for carotid artery duplex.  2. Essential hypertension Continue antihypertensive medications as already ordered, these medications have been reviewed and there are no changes at this time.    Current Outpatient Medications on File Prior to Visit  Medication Sig Dispense Refill  . aspirin EC 81 MG EC tablet Take 1 tablet (81 mg total) by mouth daily.    Marland Kitchen atorvastatin (LIPITOR) 80 MG tablet TAKE 1 TABLET BY MOUTH EVERY DAY AT 6 PM 90 tablet 0  . vitamin B-12 (CYANOCOBALAMIN) 1000 MCG tablet Take 1,000 mcg by mouth daily.    . cholecalciferol (VITAMIN D) 1000 UNITS tablet Take 1,000 Units  by mouth daily.    . clopidogrel (PLAVIX) 75 MG tablet Take 75 mg by mouth daily.    . metoprolol tartrate (LOPRESSOR) 25 MG tablet TAKE 1 TABLET BY MOUTH TWICE DAILY (Patient not taking: Reported on 10/01/2019) 60 tablet 5  . nitroGLYCERIN (NITROSTAT) 0.4 MG SL tablet Place 1 tablet (0.4 mg total) under the tongue every 5 (five) minutes x 3 doses as  needed for chest pain. (Patient not taking: Reported on 09/02/2019) 25 tablet 3   No current facility-administered medications on file prior to visit.    There are no Patient Instructions on file for this visit. No follow-ups on file.   Georgiana Spinner, NP

## 2019-11-19 ENCOUNTER — Other Ambulatory Visit (INDEPENDENT_AMBULATORY_CARE_PROVIDER_SITE_OTHER): Payer: Self-pay | Admitting: Vascular Surgery

## 2019-11-19 DIAGNOSIS — I6523 Occlusion and stenosis of bilateral carotid arteries: Secondary | ICD-10-CM

## 2019-11-19 DIAGNOSIS — Z959 Presence of cardiac and vascular implant and graft, unspecified: Secondary | ICD-10-CM

## 2019-11-26 ENCOUNTER — Encounter (INDEPENDENT_AMBULATORY_CARE_PROVIDER_SITE_OTHER): Payer: Self-pay | Admitting: Nurse Practitioner

## 2019-11-26 ENCOUNTER — Ambulatory Visit (INDEPENDENT_AMBULATORY_CARE_PROVIDER_SITE_OTHER): Payer: Medicare Other | Admitting: Nurse Practitioner

## 2019-11-26 ENCOUNTER — Other Ambulatory Visit: Payer: Self-pay

## 2019-11-26 ENCOUNTER — Ambulatory Visit (INDEPENDENT_AMBULATORY_CARE_PROVIDER_SITE_OTHER): Payer: Medicare Other

## 2019-11-26 VITALS — BP 134/78 | HR 87 | Resp 16 | Ht 68.0 in | Wt 137.0 lb

## 2019-11-26 DIAGNOSIS — Z959 Presence of cardiac and vascular implant and graft, unspecified: Secondary | ICD-10-CM

## 2019-11-26 DIAGNOSIS — I6523 Occlusion and stenosis of bilateral carotid arteries: Secondary | ICD-10-CM

## 2019-11-26 DIAGNOSIS — I10 Essential (primary) hypertension: Secondary | ICD-10-CM

## 2019-11-26 DIAGNOSIS — E782 Mixed hyperlipidemia: Secondary | ICD-10-CM

## 2019-11-30 ENCOUNTER — Encounter (INDEPENDENT_AMBULATORY_CARE_PROVIDER_SITE_OTHER): Payer: Self-pay | Admitting: Nurse Practitioner

## 2019-11-30 NOTE — Progress Notes (Signed)
Subjective:    Patient ID: Thomas Sexton, male    DOB: [birth year], 79 y.o.   MRN: [id number] Chief Complaint  Patient presents with  . Follow-up    ultrasound    The patient is seen for follow up evaluation of carotid stenosis status post right carotid stent placement on 09/02/2019.  There were no post operative problems or complications related to the surgery.  The patient denies neck or incisional pain.  The patient denies interval amaurosis fugax. There is no recent history of TIA symptoms or focal motor deficits. There is no prior documented CVA.  The patient denies headache.  The patient is taking enteric-coated aspirin 81 mg daily.  The patient has a history of coronary artery disease, no recent episodes of angina or shortness of breath. The patient denies PAD or claudication symptoms. There is a history of hyperlipidemia which is being treated with a statin.   Today noninvasive studies show 1 to 39% stenosis of the right ICA with open patent stents.  The left ICA has 60 to 79% stenosis.  The bilateral vertebral arteries demonstrate antegrade flow with normal flow hemodynamics in the bilateral subclavian arteries.   Review of Systems  Neurological: Negative for dizziness, facial asymmetry and weakness.  All other systems reviewed and are negative.      Objective:   Physical Exam Vitals reviewed.  HENT:     Head: Normocephalic.  Neck:     Vascular: Carotid bruit (Left) present.  Cardiovascular:     Rate and Rhythm: Normal rate and regular rhythm.     Pulses: Normal pulses.     Heart sounds: Normal heart sounds.  Pulmonary:     Effort: Pulmonary effort is normal.     Breath sounds: Normal breath sounds.  Skin:    General: Skin is warm.  Neurological:     Mental Status: He is alert.  Psychiatric:        Mood and Affect: Mood normal.        Behavior: Behavior normal.        Thought Content: Thought content normal.        Judgment: Judgment normal.     BP  134/78   Pulse 87   Resp 16   Ht 5\' 8"  (1.727 m)   Wt 137 lb (62.1 kg)   BMI 20.83 kg/m   Past Medical History:  Diagnosis Date  . CAD (coronary artery disease)    a. 09/2012 Acute STEMI/Cath/PCI: LM nl, LAD 37m, 30d, D1 50, LCX mild prox plaque, OM1 100 (2.5x16 Promus Premier DES), RCA dom, 8m, PDA 30p;  b. 09/2012 Echo: EF 55-60%, PASP 10/2012.  HTN (hypertension)   . Hyperlipidemia   . Tobacco abuse     Social History   Socioeconomic History  . Marital status: Single    Spouse name: Not on file  . Number of children: Not on file  . Years of education: Not on file  . Highest education level: Not on file  Occupational History  . Not on file  Tobacco Use  . Smoking status: Former Smoker    Packs/day: 0.50    Years: 50.00    Pack years: 25.00    Types: Cigarettes    Quit date: 09/27/2012    Years since quitting: 7.1  . Smokeless tobacco: Never Used  Substance and Sexual Activity  . Alcohol use: No  . Drug use: No  . Sexual activity: Not on file  Other Topics Concern  .  Not on file  Social History Narrative  . Not on file   Social Determinants of Health   Financial Resource Strain:   . Difficulty of Paying Living Expenses:   Food Insecurity:   . Worried About Programme researcher, broadcasting/film/video in the Last Year:   . Barista in the Last Year:   Transportation Needs:   . Freight forwarder (Medical):   Marland Kitchen Lack of Transportation (Non-Medical):   Physical Activity:   . Days of Exercise per Week:   . Minutes of Exercise per Session:   Stress:   . Feeling of Stress :   Social Connections:   . Frequency of Communication with Friends and Family:   . Frequency of Social Gatherings with Friends and Family:   . Attends Religious Services:   . Active Member of Clubs or Organizations:   . Attends Banker Meetings:   Marland Kitchen Marital Status:   Intimate Partner Violence:   . Fear of Current or Ex-Partner:   . Emotionally Abused:   Marland Kitchen Physically Abused:   .  Sexually Abused:     Past Surgical History:  Procedure Laterality Date  . CARDIAC CATHETERIZATION    . CAROTID PTA/STENT INTERVENTION Right 09/02/2019   Procedure: CAROTID PTA/STENT INTERVENTION;  Surgeon: Annice Needy, MD;  Location: [facility];  Service: Cardiovascular;  Laterality: Right;  . CORONARY ANGIOPLASTY  09/2012   a. 09/2012 Acute STEMI/Cath/PCI: LM nl, LAD 14m, 30d, D1 50, LCX mild prox plaque, OM1 100 (2.5x16 Promus Premier DES), RCA dom, 55m, PDA 30p;  b. 09/2012 Echo: EF 55-60%, PASP .  Marland Kitchen LEFT HEART CATH  09/30/2012   Procedure: LEFT HEART CATH;  Surgeon: Kathleene Hazel, MD;  Location: [facility] CATH LAB;  Service: Cardiovascular;;  . LEFT HEART CATHETERIZATION WITH CORONARY ANGIOGRAM N/A 09/30/2012   Procedure: STEMI;  Surgeon: Kathleene Hazel, MD;  Location: [facility] CATH LAB;  Service: Cardiovascular;  Laterality: N/A;  . PERCUTANEOUS CORONARY STENT INTERVENTION (PCI-S)  09/30/2012   Procedure: PERCUTANEOUS CORONARY STENT INTERVENTION (PCI-S);  Surgeon: Kathleene Hazel, MD;  Location: [facility] CATH LAB;  Service: Cardiovascular;;    Family History  Problem Relation Age of Onset  . Heart attack Mother   . Hypertension Father   . Hyperlipidemia Father     No Known Allergies     Assessment & Plan:   1. Carotid stenosis, bilateral Recommend:  The patient is s/p successful right carotid stent placement  Duplex ultrasound preoperatively shows 60 to 79% contralateral stenosis.  Continue antiplatelet therapy as prescribed Continue management of CAD, HTN and Hyperlipidemia Healthy heart diet,  encouraged exercise at least 4 times per week  Follow up in 6 months with duplex ultrasound and physical exam based on the patient's carotid surgery and greater than 50% stenosis of the left internal carotid artery    2. Mixed hyperlipidemia Continue statin as ordered and reviewed, no changes at this time   3. Essential hypertension Continue  antihypertensive medications as already ordered, these medications have been reviewed and there are no changes at this time.    Current Outpatient Medications on File Prior to Visit  Medication Sig Dispense Refill  . acetaminophen (TYLENOL) 500 MG tablet Take by mouth.    Marland Kitchen aspirin EC 81 MG EC tablet Take 1 tablet (81 mg total) by mouth daily.    Marland Kitchen atorvastatin (LIPITOR) 80 MG tablet TAKE 1 TABLET BY MOUTH EVERY DAY AT 6 PM 90 tablet 0  .  cholecalciferol (VITAMIN D) 1000 UNITS tablet Take 1,000 Units by mouth daily.    . clopidogrel (PLAVIX) 75 MG tablet Take 75 mg by mouth daily.    . naproxen sodium (ALEVE) 220 MG tablet Take by mouth.    . vitamin B-12 (CYANOCOBALAMIN) 1000 MCG tablet Take 1,000 mcg by mouth daily.    . metoprolol tartrate (LOPRESSOR) 25 MG tablet TAKE 1 TABLET BY MOUTH TWICE DAILY (Patient not taking: Reported on 10/01/2019) 60 tablet 5  . nitroGLYCERIN (NITROSTAT) 0.4 MG SL tablet Place 1 tablet (0.4 mg total) under the tongue every 5 (five) minutes x 3 doses as needed for chest pain. (Patient not taking: Reported on 09/02/2019) 25 tablet 3   No current facility-administered medications on file prior to visit.    There are no Patient Instructions on file for this visit. No follow-ups on file.   Georgiana Spinner, NP

## 2020-01-21 ENCOUNTER — Other Ambulatory Visit (INDEPENDENT_AMBULATORY_CARE_PROVIDER_SITE_OTHER): Payer: Self-pay | Admitting: Vascular Surgery

## 2020-02-15 NOTE — Progress Notes (Signed)
Cardiology Office Note  Date:  02/16/2020   ID:  Thomas Sexton, DOB 29-Jul-1940, MRN [id number]  PCP:  Tiana Loft, MD   Chief Complaint  Patient presents with  . other    6 month follow up. Meds reviewed by the pt. verbally. "doing well."     HPI:  Thomas Sexton is a pleasant 79 year old gentleman with PMH of  CAD [facility] on Sep 30 2012 with chest pain,  STEMI, cardiac catheterization Sep 30, 2012  bare-metal stent placed to his OM1. 40% mid and 30% distal LAD disease, 50% diagonal disease, 30% RCA disease in the mid region with 30% proximal PDA disease. Troponin was greater than 20. Echo 10/01/2012 showing normal ejection fraction55-60% Carotid ultrasound 60 to 79% b/l disease 05/2015 Recent carotid 80 to 99% stenosis on the right Carotid stent on the right April 2021 He presentsrecent today for follow-up of his coronary artery disease  On his last clinic visit carotid ultrasound showing severe disease on the right We ordered CTA neck showing string sign on the right, 70% stenosis on the left Underwent stent placement on the right April 2021  No TIA or CVA  Active walks daily No chest pain or shortness of breath concerning for angina  Carotid u/s, results discussed with him in detail Dated 11/2019 left ICA are consistent with a 60-79% stenosis.   Labs reviewed HBA1C 5.8 CR 1.28 Total chol 91, LDL 47  EKG personally reviewed by myself on todays visit Shows sinus bradycardia rate 72 bpm nonspecific T wave abnormality,PVCs   PMH:   has a past medical history of CAD (coronary artery disease), HTN (hypertension), Hyperlipidemia, and Tobacco abuse.  PSH:    Past Surgical History:  Procedure Laterality Date  . CARDIAC CATHETERIZATION    . CAROTID PTA/STENT INTERVENTION Right 09/02/2019   Procedure: CAROTID PTA/STENT INTERVENTION;  Surgeon: Annice Needy, MD;  Location: [facility] INVASIVE CV LAB;  Service: Cardiovascular;  Laterality: Right;  . CORONARY ANGIOPLASTY   09/2012   a. 09/2012 Acute STEMI/Cath/PCI: LM nl, LAD 71m, 30d, D1 50, LCX mild prox plaque, OM1 100 (2.5x16 Promus Premier DES), RCA dom, 27m, PDA 30p;  b. 09/2012 Echo: EF 55-60%, PASP .  Marland Kitchen LEFT HEART CATH  09/30/2012   Procedure: LEFT HEART CATH;  Surgeon: Kathleene Hazel, MD;  Location: [facility] CATH LAB;  Service: Cardiovascular;;  . LEFT HEART CATHETERIZATION WITH CORONARY ANGIOGRAM N/A 09/30/2012   Procedure: STEMI;  Surgeon: Kathleene Hazel, MD;  Location: [facility] CATH LAB;  Service: Cardiovascular;  Laterality: N/A;  . PERCUTANEOUS CORONARY STENT INTERVENTION (PCI-S)  09/30/2012   Procedure: PERCUTANEOUS CORONARY STENT INTERVENTION (PCI-S);  Surgeon: Kathleene Hazel, MD;  Location: [facility] CATH LAB;  Service: Cardiovascular;;    Current Outpatient Medications  Medication Sig Dispense Refill  . acetaminophen (TYLENOL) 500 MG tablet Take by mouth.    Marland Kitchen aspirin EC 81 MG EC tablet Take 1 tablet (81 mg total) by mouth daily.    Marland Kitchen atorvastatin (LIPITOR) 80 MG tablet TAKE 1 TABLET BY MOUTH EVERY DAY AT 6 PM 90 tablet 0  . cholecalciferol (VITAMIN D) 1000 UNITS tablet Take 1,000 Units by mouth daily.    . clopidogrel (PLAVIX) 75 MG tablet TAKE 1 TABLET BY MOUTH EVERY DAY 30 tablet 5  . metoprolol tartrate (LOPRESSOR) 25 MG tablet TAKE 1 TABLET BY MOUTH TWICE DAILY 60 tablet 5  . naproxen sodium (ALEVE) 220 MG tablet Take by mouth.    . nitroGLYCERIN (NITROSTAT) 0.4 MG SL tablet  Place 1 tablet (0.4 mg total) under the tongue every 5 (five) minutes x 3 doses as needed for chest pain. 25 tablet 3  . vitamin B-12 (CYANOCOBALAMIN) 1000 MCG tablet Take 1,000 mcg by mouth daily.     No current facility-administered medications for this visit.     Allergies:   Patient has no known allergies.   Social History:  The patient  reports that he quit smoking about 7 years ago. His smoking use included cigarettes. He has a 25.00 pack-year smoking history. He has never used smokeless tobacco. He  reports that he does not drink alcohol and does not use drugs.   Family History:   family history includes Heart attack in his mother; Hyperlipidemia in his father; Hypertension in his father.    Review of Systems: Review of Systems  Constitutional: Negative.   Respiratory: Negative.   Cardiovascular: Negative.   Gastrointestinal: Negative.   Musculoskeletal: Negative.   Neurological: Negative.   Psychiatric/Behavioral: Negative.   All other systems reviewed and are negative.   PHYSICAL EXAM: VS:  BP 120/72 (BP Location: Left Arm, Patient Position: Sitting, Cuff Size: Normal)   Pulse 72   Ht 5\' 8"  (1.727 m)   Wt 138 lb (62.6 kg)   SpO2 96%   BMI 20.98 kg/m  , BMI Body mass index is 20.98 kg/m. Constitutional:  oriented to person, place, and time. No distress.  HENT:  Head: Grossly normal Eyes:  no discharge. No scleral icterus.  Neck: No JVD, 2+ carotid bruit on the left Cardiovascular: Regular rate and rhythm, no murmurs appreciated Pulmonary/Chest: Clear to auscultation bilaterally, no wheezes or rails Abdominal: Soft.  no distension.  no tenderness.  Musculoskeletal: Normal range of motion Neurological:  normal muscle tone. Coordination normal. No atrophy Skin: Skin warm and dry Psychiatric: normal affect, pleasant  Recent Labs: 09/03/2019: BUN 19; Creatinine, Ser 1.28; Hemoglobin 10.2; Platelets 177; Potassium 3.7; Sodium 142    Lipid Panel Lab Results  Component Value Date   CHOL 94 (L) 05/31/2015   HDL 42 05/31/2015   LDLCALC 37 05/31/2015   TRIG 75 05/31/2015      Wt Readings from Last 3 Encounters:  02/16/20 138 lb (62.6 kg)  11/26/19 137 lb (62.1 kg)  10/01/19 142 lb (64.4 kg)      ASSESSMENT AND PLAN:  Coronary disease with stable angina Currently with no symptoms of angina. No further workup at this time. Continue current medication regimen. Stable  Essential hypertension Blood pressure is well controlled on today's visit. No changes made  to the medications. Stable  Bilateral carotid artery stenosis Severe disease on the right, stent placed April 2021 60 to 79% disease on the left, bruit appreciated Stop smoking, cholesterol at goal, A1c at goal Has repeat ultrasound scheduled early 2022 through Dr. 2023  Tobacco abuse Stopped 2014,  Currently not smoking  Type 2 diabetes, controlled A1c well controlled Numbers discussed with him    Total encounter time more than 25 minutes  Greater than 50% was spent in counseling and coordination of care with the patient    No orders of the defined types were placed in this encounter.    Signed, 2015, M.D., Ph.D. 02/16/2020  [facility] [place], [place]

## 2020-02-16 ENCOUNTER — Encounter: Payer: Self-pay | Admitting: Cardiovascular Disease

## 2020-02-16 ENCOUNTER — Ambulatory Visit (INDEPENDENT_AMBULATORY_CARE_PROVIDER_SITE_OTHER): Payer: Medicare Other | Admitting: Cardiovascular Disease

## 2020-02-16 ENCOUNTER — Other Ambulatory Visit: Payer: Self-pay

## 2020-02-16 VITALS — BP 120/72 | HR 72 | Ht 68.0 in | Wt 138.0 lb

## 2020-02-16 DIAGNOSIS — Z720 Tobacco use: Secondary | ICD-10-CM | POA: Diagnosis not present

## 2020-02-16 DIAGNOSIS — I25118 Atherosclerotic heart disease of native coronary artery with other forms of angina pectoris: Secondary | ICD-10-CM

## 2020-02-16 DIAGNOSIS — I10 Essential (primary) hypertension: Secondary | ICD-10-CM

## 2020-02-16 DIAGNOSIS — E785 Hyperlipidemia, unspecified: Secondary | ICD-10-CM

## 2020-02-16 DIAGNOSIS — I739 Peripheral vascular disease, unspecified: Secondary | ICD-10-CM

## 2020-02-16 DIAGNOSIS — I6523 Occlusion and stenosis of bilateral carotid arteries: Secondary | ICD-10-CM

## 2020-02-16 NOTE — Patient Instructions (Signed)
Medication Instructions:  No changes  If you need a refill on your cardiac medications before your next appointment, please call your pharmacy.    Lab work: No new labs needed   If you have labs (blood work) drawn today and your tests are completely normal, you will receive your results only by: . MyChart Message (if you have MyChart) OR . A paper copy in the mail If you have any lab test that is abnormal or we need to change your treatment, we will call you to review the results.   Testing/Procedures: No new testing needed   Follow-Up: At [facility], you and your health needs are our priority.  As part of our continuing mission to provide you with exceptional heart care, we have created designated Provider Care Teams.  These Care Teams include your primary Cardiologist (physician) and Advanced Practice Providers (APPs -  Physician Assistants and Nurse Practitioners) who all work together to provide you with the care you need, when you need it.  . You will need a follow up appointment in 12 months  . Providers on your designated Care Team:   . Christopher Berge, NP . Ryan Dunn, PA-C . Jacquelyn Visser, PA-C  Any Other Special Instructions Will Be Listed Below (If Applicable).  COVID-19 Vaccine Information can be found at: [email or web address] For questions related to vaccine distribution or appointments, please email [email or web address] or call [phone].     

## 2020-05-26 ENCOUNTER — Other Ambulatory Visit (INDEPENDENT_AMBULATORY_CARE_PROVIDER_SITE_OTHER): Payer: Self-pay | Admitting: Nurse Practitioner

## 2020-05-26 DIAGNOSIS — I6523 Occlusion and stenosis of bilateral carotid arteries: Secondary | ICD-10-CM

## 2020-05-31 ENCOUNTER — Ambulatory Visit (INDEPENDENT_AMBULATORY_CARE_PROVIDER_SITE_OTHER): Payer: Medicare Other

## 2020-05-31 ENCOUNTER — Encounter (INDEPENDENT_AMBULATORY_CARE_PROVIDER_SITE_OTHER): Payer: Self-pay | Admitting: Vascular Surgery

## 2020-05-31 ENCOUNTER — Other Ambulatory Visit: Payer: Self-pay

## 2020-05-31 ENCOUNTER — Ambulatory Visit (INDEPENDENT_AMBULATORY_CARE_PROVIDER_SITE_OTHER): Payer: Medicare Other | Admitting: Vascular Surgery

## 2020-05-31 VITALS — BP 157/80 | HR 71 | Ht 68.0 in | Wt 140.0 lb

## 2020-05-31 DIAGNOSIS — I10 Essential (primary) hypertension: Secondary | ICD-10-CM | POA: Diagnosis not present

## 2020-05-31 DIAGNOSIS — I6523 Occlusion and stenosis of bilateral carotid arteries: Secondary | ICD-10-CM

## 2020-05-31 DIAGNOSIS — E782 Mixed hyperlipidemia: Secondary | ICD-10-CM

## 2020-05-31 DIAGNOSIS — E119 Type 2 diabetes mellitus without complications: Secondary | ICD-10-CM | POA: Diagnosis not present

## 2020-05-31 NOTE — Progress Notes (Signed)
MRN : [id number]  Thomas Sexton is a 80 y.o. (1941/04/25) male who presents with chief complaint of  Chief Complaint  Patient presents with  . Carotid    6 mo U/S  .  History of Present Illness: Patient presents in follow-up of his carotid disease. He is about 9 months status post right carotid artery stent placement for high-grade stenosis with a very high lesion. He did well from that and has had no issues. He denies any recent focal neurologic symptoms. Specifically, the patient denies amaurosis fugax, speech or swallowing difficulties, or arm or leg weakness or numbness. He had known borderline high-grade left carotid artery stenosis at that time. This was in the distal left common carotid artery. Carotid duplex today shows a patent right carotid artery stent without recurrent stenosis. Left carotid artery stenosis has progressed and appears more high-grade in the distal left common carotid artery. The velocities are now approaching 300 in the common carotid artery would be indicative of relatively high-grade stenosis. This has progressed from his previous study where the velocities were just over 200. This also correlates with the CT scan from almost a year ago which showed significant stenosis by my interpretation of greater than 70%.  Current Outpatient Medications  Medication Sig Dispense Refill  . acetaminophen (TYLENOL) 500 MG tablet Take by mouth.    Marland Kitchen aspirin EC 81 MG EC tablet Take 1 tablet (81 mg total) by mouth daily.    Marland Kitchen atorvastatin (LIPITOR) 80 MG tablet TAKE 1 TABLET BY MOUTH EVERY DAY AT 6 PM 90 tablet 0  . cholecalciferol (VITAMIN D) 1000 UNITS tablet Take 1,000 Units by mouth daily.    . clopidogrel (PLAVIX) 75 MG tablet TAKE 1 TABLET BY MOUTH EVERY DAY 30 tablet 5  . metoprolol tartrate (LOPRESSOR) 25 MG tablet TAKE 1 TABLET BY MOUTH TWICE DAILY 60 tablet 5  . naproxen sodium (ALEVE) 220 MG tablet Take by mouth.    . nitroGLYCERIN (NITROSTAT) 0.4 MG SL tablet Place  1 tablet (0.4 mg total) under the tongue every 5 (five) minutes x 3 doses as needed for chest pain. 25 tablet 3  . predniSONE (DELTASONE) 20 MG tablet Take 3 tabs by mouth daily for 3 days, 2 tabs by mouth for 4 days and 1 tab daily for 5 days    . vitamin B-12 (CYANOCOBALAMIN) 1000 MCG tablet Take 1,000 mcg by mouth daily.     No current facility-administered medications for this visit.    Past Medical History:  Diagnosis Date  . CAD (coronary artery disease)    a. 09/2012 Acute STEMI/Cath/PCI: LM nl, LAD 70m, 30d, D1 50, LCX mild prox plaque, OM1 100 (2.5x16 Promus Premier DES), RCA dom, 37m, PDA 30p;  b. 09/2012 Echo: EF 55-60%, PASP .  Marland Kitchen HTN (hypertension)   . Hyperlipidemia   . Tobacco abuse     Past Surgical History:  Procedure Laterality Date  . CARDIAC CATHETERIZATION    . CAROTID PTA/STENT INTERVENTION Right 09/02/2019   Procedure: CAROTID PTA/STENT INTERVENTION;  Surgeon: Annice Needy, MD;  Location: [facility];  Service: Cardiovascular;  Laterality: Right;  . CORONARY ANGIOPLASTY  09/2012   a. 09/2012 Acute STEMI/Cath/PCI: LM nl, LAD 14m, 30d, D1 50, LCX mild prox plaque, OM1 100 (2.5x16 Promus Premier DES), RCA dom, 62m, PDA 30p;  b. 09/2012 Echo: EF 55-60%, PASP .  Marland Kitchen LEFT HEART CATH  09/30/2012   Procedure: LEFT HEART CATH;  Surgeon: Kathleene Hazel, MD;  Location: [facility];  Service: Cardiovascular;;  . LEFT HEART CATHETERIZATION WITH CORONARY ANGIOGRAM N/A 09/30/2012   Procedure: STEMI;  Surgeon: Christopher D McAlhany, MD;  Location: [facility];  Service: Cardiovascular;  Laterality: N/A;  . PERCUTANEOUS CORONARY STENT INTERVENTION (PCI-S)  09/30/2012   Procedure: PERCUTANEOUS CORONARY STENT INTERVENTION (PCI-S);  Surgeon: Christopher D McAlhany, MD;  Location: [facility];  Service: Cardiovascular;;     Social History   Tobacco Use  . Smoking status: Former Smoker    Packs/day: 0.50    Years: 50.00    Pack years: 25.00    Types:  Cigarettes    Quit date: 09/27/2012    Years since quitting: 7.6  . Smokeless tobacco: Never Used  Substance Use Topics  . Alcohol use: No  . Drug use: No      Family History  Problem Relation Age of Onset  . Heart attack Mother   . Hypertension Father   . Hyperlipidemia Father   no bleeding or clotting disorders  No Known Allergies  REVIEW OF SYSTEMS (Negative unless checked)  Constitutional: []?Weight loss  []?Fever  []?Chills Cardiac: []?Chest pain   []?Chest pressure   []?Palpitations   []?Shortness of breath when laying flat   []?Shortness of breath at rest   []?Shortness of breath with exertion. Vascular:  []?Pain in legs with walking   []?Pain in legs at rest   []?Pain in legs when laying flat   []?Claudication   []?Pain in feet when walking  []?Pain in feet at rest  []?Pain in feet when laying flat   []?History of DVT   []?Phlebitis   []?Swelling in legs   []?Varicose veins   []?Non-healing ulcers Pulmonary:   []?Uses home oxygen   []?Productive cough   []?Hemoptysis   []?Wheeze  []?COPD   []?Asthma Neurologic:  []?Dizziness  []?Blackouts   []?Seizures   []?History of stroke   []?History of TIA  []?Aphasia   []?Temporary blindness   []?Dysphagia   []?Weakness or numbness in arms   []?Weakness or numbness in legs Musculoskeletal:  [x]?Arthritis   []?Joint swelling   [x]?Joint pain   []?Low back pain Hematologic:  []?Easy bruising  []?Easy bleeding   []?Hypercoagulable state   [x]?Anemic  []?Hepatitis Gastrointestinal:  []?Blood in stool   []?Vomiting blood  [x]?Gastroesophageal reflux/heartburn   []?Abdominal pain Genitourinary:  []?Chronic kidney disease   []?Difficult urination  []?Frequent urination  []?Burning with urination   []?Hematuria Skin:  []?Rashes   []?Ulcers   []?Wounds Psychological:  []?History of anxiety   []? History of major depression.  Physical Examination  Vitals:   05/31/20 1434  BP: (!) 157/80  Pulse: 71  Weight: 140 lb (63.5 kg)  Height: 5' 8"  (1.727 m)   Body mass index is 21.29 kg/m. Gen:  WD/WN, NAD Head: [place]/AT, No temporalis wasting. Ear/Nose/Throat: Hearing grossly intact, nares w/o erythema or drainage, trachea midline Eyes: Conjunctiva clear. Sclera non-icteric Neck: Supple.  loud left carotid bruit  Pulmonary:  Good air movement, equal and clear to auscultation bilaterally.  Cardiac: RRR, No JVD Vascular:  Vessel Right Left  Radial Palpable Palpable       Musculoskeletal: M/S 5/5 throughout.  No deformity or atrophy. No edema. Walks with a cane Neurologic: CN 2-12 intact. Sensation grossly intact in extremities.  Symmetrical.  Speech is fluent. Motor exam as listed above. Psychiatric: Judgment intact, Mood & affect appropriate for pt's clinical situation. Dermatologic: No rashes or ulcers noted.  No cellulitis or open   wounds. Lymph : No Cervical, Axillary, or Inguinal lymphadenopathy.     CBC Lab Results  Component Value Date   WBC 7.2 09/03/2019   HGB 10.2 (L) 09/03/2019   HCT 32.5 (L) 09/03/2019   MCV 95.9 09/03/2019   PLT 177 09/03/2019    BMET    Component Value Date/Time   NA 142 09/03/2019 0429   NA 136 09/30/2012 1623   K 3.7 09/03/2019 0429   K 4.3 09/30/2012 1623   CL 111 09/03/2019 0429   CL 103 09/30/2012 1623   CO2 23 09/03/2019 0429   CO2 27 09/30/2012 1623   GLUCOSE 107 (H) 09/03/2019 0429   GLUCOSE 131 (H) 09/30/2012 1623   BUN 19 09/03/2019 0429   BUN 23 (H) 09/30/2012 1623   CREATININE 1.28 (H) 09/03/2019 0429   CREATININE 1.92 (H) 09/30/2012 1623   CALCIUM 8.7 (L) 09/03/2019 0429   CALCIUM 9.4 09/30/2012 1623   GFRNONAA 53 (L) 09/03/2019 0429   GFRNONAA 34 (L) 09/30/2012 1623   GFRAA >60 09/03/2019 0429   GFRAA 39 (L) 09/30/2012 1623   CrCl cannot be calculated (Patient's most recent lab result is older than the maximum 21 days allowed.).  COAG No results found for: INR, PROTIME  Radiology No results found.   Assessment/Plan Hypertension blood pressure control  important in reducing the progression of atherosclerotic disease. On appropriate oral medications.   Hyperlipidemia lipid control important in reducing the progression of atherosclerotic disease. Continue statin therapy  Type 2 diabetes mellitus (HCC) blood glucose control important in reducing the progression of atherosclerotic disease. Also, involved in wound healing. On appropriate medications.   Carotid stenosis, bilateral Carotid duplex today shows a patent right carotid artery stent without recurrent stenosis. Left carotid artery stenosis has progressed and appears more high-grade in the distal left common carotid artery. The velocities are now approaching 300 in the common carotid artery would be indicative of relatively high-grade stenosis. This has progressed from his previous study where the velocities were just over 200. This also correlates with the CT scan from almost a year ago which showed significant stenosis by my interpretation of greater than 70%. Given these findings, we discussed consideration for repair at this point. This lesion may be amenable to surgical therapy unlike his right side which was a very high lesion and had to be stented. He was very pleased with how he did with the stent and would prefer this therapy on the left side as well which certainly seems reasonable. I have discussed the risks and benefits of carotid artery stenting on the left. He will continue his aspirin, Plavix, and statin agent.    Festus Barren, MD  05/31/2020 3:00 PM    This note was created with Dragon medical transcription system.  Any errors from dictation are purely unintentional

## 2020-05-31 NOTE — H&P (View-Only) (Signed)
MRN : [id number]  Thomas Sexton is a 80 y.o. (1941/04/25) male who presents with chief complaint of  Chief Complaint  Patient presents with  . Carotid    6 mo U/S  .  History of Present Illness: Patient presents in follow-up of his carotid disease. He is about 9 months status post right carotid artery stent placement for high-grade stenosis with a very high lesion. He did well from that and has had no issues. He denies any recent focal neurologic symptoms. Specifically, the patient denies amaurosis fugax, speech or swallowing difficulties, or arm or leg weakness or numbness. He had known borderline high-grade left carotid artery stenosis at that time. This was in the distal left common carotid artery. Carotid duplex today shows a patent right carotid artery stent without recurrent stenosis. Left carotid artery stenosis has progressed and appears more high-grade in the distal left common carotid artery. The velocities are now approaching 300 in the common carotid artery would be indicative of relatively high-grade stenosis. This has progressed from his previous study where the velocities were just over 200. This also correlates with the CT scan from almost a year ago which showed significant stenosis by my interpretation of greater than 70%.  Current Outpatient Medications  Medication Sig Dispense Refill  . acetaminophen (TYLENOL) 500 MG tablet Take by mouth.    Marland Kitchen aspirin EC 81 MG EC tablet Take 1 tablet (81 mg total) by mouth daily.    Marland Kitchen atorvastatin (LIPITOR) 80 MG tablet TAKE 1 TABLET BY MOUTH EVERY DAY AT 6 PM 90 tablet 0  . cholecalciferol (VITAMIN D) 1000 UNITS tablet Take 1,000 Units by mouth daily.    . clopidogrel (PLAVIX) 75 MG tablet TAKE 1 TABLET BY MOUTH EVERY DAY 30 tablet 5  . metoprolol tartrate (LOPRESSOR) 25 MG tablet TAKE 1 TABLET BY MOUTH TWICE DAILY 60 tablet 5  . naproxen sodium (ALEVE) 220 MG tablet Take by mouth.    . nitroGLYCERIN (NITROSTAT) 0.4 MG SL tablet Place  1 tablet (0.4 mg total) under the tongue every 5 (five) minutes x 3 doses as needed for chest pain. 25 tablet 3  . predniSONE (DELTASONE) 20 MG tablet Take 3 tabs by mouth daily for 3 days, 2 tabs by mouth for 4 days and 1 tab daily for 5 days    . vitamin B-12 (CYANOCOBALAMIN) 1000 MCG tablet Take 1,000 mcg by mouth daily.     No current facility-administered medications for this visit.    Past Medical History:  Diagnosis Date  . CAD (coronary artery disease)    a. 09/2012 Acute STEMI/Cath/PCI: LM nl, LAD 70m, 30d, D1 50, LCX mild prox plaque, OM1 100 (2.5x16 Promus Premier DES), RCA dom, 37m, PDA 30p;  b. 09/2012 Echo: EF 55-60%, PASP .  Marland Kitchen HTN (hypertension)   . Hyperlipidemia   . Tobacco abuse     Past Surgical History:  Procedure Laterality Date  . CARDIAC CATHETERIZATION    . CAROTID PTA/STENT INTERVENTION Right 09/02/2019   Procedure: CAROTID PTA/STENT INTERVENTION;  Surgeon: Annice Needy, MD;  Location: [facility];  Service: Cardiovascular;  Laterality: Right;  . CORONARY ANGIOPLASTY  09/2012   a. 09/2012 Acute STEMI/Cath/PCI: LM nl, LAD 14m, 30d, D1 50, LCX mild prox plaque, OM1 100 (2.5x16 Promus Premier DES), RCA dom, 62m, PDA 30p;  b. 09/2012 Echo: EF 55-60%, PASP .  Marland Kitchen LEFT HEART CATH  09/30/2012   Procedure: LEFT HEART CATH;  Surgeon: Kathleene Hazel, MD;  Location: [facility];  Service: Cardiovascular;;  . LEFT HEART CATHETERIZATION WITH CORONARY ANGIOGRAM N/A 09/30/2012   Procedure: STEMI;  Surgeon: Kathleene Hazel, MD;  Location: [facility] CATH LAB;  Service: Cardiovascular;  Laterality: N/A;  . PERCUTANEOUS CORONARY STENT INTERVENTION (PCI-S)  09/30/2012   Procedure: PERCUTANEOUS CORONARY STENT INTERVENTION (PCI-S);  Surgeon: Kathleene Hazel, MD;  Location: [facility] CATH LAB;  Service: Cardiovascular;;     Social History   Tobacco Use  . Smoking status: Former Smoker    Packs/day: 0.50    Years: 50.00    Pack years: 25.00    Types:  Cigarettes    Quit date: 09/27/2012    Years since quitting: 7.6  . Smokeless tobacco: Never Used  Substance Use Topics  . Alcohol use: No  . Drug use: No      Family History  Problem Relation Age of Onset  . Heart attack Mother   . Hypertension Father   . Hyperlipidemia Father   no bleeding or clotting disorders  No Known Allergies  REVIEW OF SYSTEMS (Negative unless checked)  Constitutional: [] ?Weight loss  [] ?Fever  [] ?Chills Cardiac: [] ?Chest pain   [] ?Chest pressure   [] ?Palpitations   [] ?Shortness of breath when laying flat   [] ?Shortness of breath at rest   [] ?Shortness of breath with exertion. Vascular:  [] ?Pain in legs with walking   [] ?Pain in legs at rest   [] ?Pain in legs when laying flat   [] ?Claudication   [] ?Pain in feet when walking  [] ?Pain in feet at rest  [] ?Pain in feet when laying flat   [] ?History of DVT   [] ?Phlebitis   [] ?Swelling in legs   [] ?Varicose veins   [] ?Non-healing ulcers Pulmonary:   [] ?Uses home oxygen   [] ?Productive cough   [] ?Hemoptysis   [] ?Wheeze  [] ?COPD   [] ?Asthma Neurologic:  [] ?Dizziness  [] ?Blackouts   [] ?Seizures   [] ?History of stroke   [] ?History of TIA  [] ?Aphasia   [] ?Temporary blindness   [] ?Dysphagia   [] ?Weakness or numbness in arms   [] ?Weakness or numbness in legs Musculoskeletal:  [x] ?Arthritis   [] ?Joint swelling   [x] ?Joint pain   [] ?Low back pain Hematologic:  [] ?Easy bruising  [] ?Easy bleeding   [] ?Hypercoagulable state   [x] ?Anemic  [] ?Hepatitis Gastrointestinal:  [] ?Blood in stool   [] ?Vomiting blood  [x] ?Gastroesophageal reflux/heartburn   [] ?Abdominal pain Genitourinary:  [] ?Chronic kidney disease   [] ?Difficult urination  [] ?Frequent urination  [] ?Burning with urination   [] ?Hematuria Skin:  [] ?Rashes   [] ?Ulcers   [] ?Wounds Psychological:  [] ?History of anxiety   [] ? History of major depression.  Physical Examination  Vitals:   05/31/20 1434  BP: (!) 157/80  Pulse: 71  Weight: 140 lb (63.5 kg)  Height: 5\' 8"   (1.727 m)   Body mass index is 21.29 kg/m. Gen:  WD/WN, NAD Head: [place]/AT, No temporalis wasting. Ear/Nose/Throat: Hearing grossly intact, nares w/o erythema or drainage, trachea midline Eyes: Conjunctiva clear. Sclera non-icteric Neck: Supple.  loud left carotid bruit  Pulmonary:  Good air movement, equal and clear to auscultation bilaterally.  Cardiac: RRR, No JVD Vascular:  Vessel Right Left  Radial Palpable Palpable       Musculoskeletal: M/S 5/5 throughout.  No deformity or atrophy. No edema. Walks with a cane Neurologic: CN 2-12 intact. Sensation grossly intact in extremities.  Symmetrical.  Speech is fluent. Motor exam as listed above. Psychiatric: Judgment intact, Mood & affect appropriate for pt's clinical situation. Dermatologic: No rashes or ulcers noted.  No cellulitis or open  wounds. Lymph : No Cervical, Axillary, or Inguinal lymphadenopathy.     CBC Lab Results  Component Value Date   WBC 7.2 09/03/2019   HGB 10.2 (L) 09/03/2019   HCT 32.5 (L) 09/03/2019   MCV 95.9 09/03/2019   PLT 177 09/03/2019    BMET    Component Value Date/Time   NA 142 09/03/2019 0429   NA 136 09/30/2012 1623   K 3.7 09/03/2019 0429   K 4.3 09/30/2012 1623   CL 111 09/03/2019 0429   CL 103 09/30/2012 1623   CO2 23 09/03/2019 0429   CO2 27 09/30/2012 1623   GLUCOSE 107 (H) 09/03/2019 0429   GLUCOSE 131 (H) 09/30/2012 1623   BUN 19 09/03/2019 0429   BUN 23 (H) 09/30/2012 1623   CREATININE 1.28 (H) 09/03/2019 0429   CREATININE 1.92 (H) 09/30/2012 1623   CALCIUM 8.7 (L) 09/03/2019 0429   CALCIUM 9.4 09/30/2012 1623   GFRNONAA 53 (L) 09/03/2019 0429   GFRNONAA 34 (L) 09/30/2012 1623   GFRAA >60 09/03/2019 0429   GFRAA 39 (L) 09/30/2012 1623   CrCl cannot be calculated (Patient's most recent lab result is older than the maximum 21 days allowed.).  COAG No results found for: INR, PROTIME  Radiology No results found.   Assessment/Plan Hypertension blood pressure control  important in reducing the progression of atherosclerotic disease. On appropriate oral medications.   Hyperlipidemia lipid control important in reducing the progression of atherosclerotic disease. Continue statin therapy  Type 2 diabetes mellitus (HCC) blood glucose control important in reducing the progression of atherosclerotic disease. Also, involved in wound healing. On appropriate medications.   Carotid stenosis, bilateral Carotid duplex today shows a patent right carotid artery stent without recurrent stenosis. Left carotid artery stenosis has progressed and appears more high-grade in the distal left common carotid artery. The velocities are now approaching 300 in the common carotid artery would be indicative of relatively high-grade stenosis. This has progressed from his previous study where the velocities were just over 200. This also correlates with the CT scan from almost a year ago which showed significant stenosis by my interpretation of greater than 70%. Given these findings, we discussed consideration for repair at this point. This lesion may be amenable to surgical therapy unlike his right side which was a very high lesion and had to be stented. He was very pleased with how he did with the stent and would prefer this therapy on the left side as well which certainly seems reasonable. I have discussed the risks and benefits of carotid artery stenting on the left. He will continue his aspirin, Plavix, and statin agent.    Festus Barren, MD  05/31/2020 3:00 PM    This note was created with Dragon medical transcription system.  Any errors from dictation are purely unintentional

## 2020-05-31 NOTE — Assessment & Plan Note (Signed)
Carotid duplex today shows a patent right carotid artery stent without recurrent stenosis. Left carotid artery stenosis has progressed and appears more high-grade in the distal left common carotid artery. The velocities are now approaching 300 in the common carotid artery would be indicative of relatively high-grade stenosis. This has progressed from his previous study where the velocities were just over 200. This also correlates with the CT scan from almost a year ago which showed significant stenosis by my interpretation of greater than 70%. Given these findings, we discussed consideration for repair at this point. This lesion may be amenable to surgical therapy unlike his right side which was a very high lesion and had to be stented. He was very pleased with how he did with the stent and would prefer this therapy on the left side as well which certainly seems reasonable. I have discussed the risks and benefits of carotid artery stenting on the left. He will continue his aspirin, Plavix, and statin agent.

## 2020-05-31 NOTE — Assessment & Plan Note (Signed)
blood glucose control important in reducing the progression of atherosclerotic disease. Also, involved in wound healing. On appropriate medications.  

## 2020-05-31 NOTE — Patient Instructions (Signed)
Carotid Angioplasty With Stent Carotid angioplasty with stent is a procedure to open or widen an artery in the neck (carotid artery) that has become narrowed. This is done by inflating a small balloon inside the artery and then placing a small piece of metal that looks like a coil or spring (stent) inside the artery. The stent helps keep the artery open by supporting the artery walls. The carotid arteries supply blood to the brain. When fats, cholesterol, and other materials (plaque) build up in an artery, the artery becomes narrow and can become blocked. This can reduce or block blood flow to certain areas of the brain, which can cause serious health problems, including stroke. Tell a health care provider about:  Any allergies you have.  All medicines you are taking, including vitamins, herbs, eye drops, creams, and over-the-counter medicines.  Any problems you or family members have had with anesthetic medicines.  Any blood disorders you have.  Any surgeries you have had.  Any medical conditions you have.  Whether you are pregnant or may be pregnant. What are the risks? Generally, this is a safe procedure. However, problems may occur, including:  Infection.  Bleeding.  Allergic reactions to medicines or dyes.  Damage to other structures or organs, or to the carotid artery itself.  The carotid artery becoming blocked again.  A collection of blood under the skin (hematoma) around the stent site that gets larger.  A blood clot in another part of the body.  Kidney injury.  Stroke.  Heart attack. What happens before the procedure?  Ask your health care provider about: ? Changing or stopping your regular medicines. This is especially important if you are taking diabetes medicines or blood thinners. ? Whether aspirin is recommended before this procedure. ? Taking over-the-counter medicines, vitamins, herbs, and supplements.  Follow instructions from your health care provider  about eating or drinking restrictions.  Do not use any products that contain nicotine or tobacco for 4 weeks before the procedure. These products include cigarettes, e-cigarettes, and chewing tobacco. If you need help quitting, ask your health care provider.  Ask your health care provider what steps will be taken to help prevent infection. These may include: ? Removing hair at the surgery site. ? Washing skin with a germ-killing soap. ? Taking antibiotic medicine.  You may have blood tests and imaging tests done.  Plan to have someone take you home from the hospital or clinic.  If you will be going home right after the procedure, plan to have someone with you for 24 hours. What happens during the procedure?  An IV will be inserted into one of your veins.  You may be given one or more of the following: ? A medicine to help you relax (sedative). ? A medicine to numb the area where the catheter will be inserted (local anesthetic).  Most commonly, an incision will be made in your groin. In some cases, an incision may be made in your wrist or forearm instead of your groin.  A small, thin tube (catheter) will be inserted through your incision, into an artery. The catheter will be threaded upward into your carotid artery. An X-ray machine (fluoroscope) will help your health care provider guide the catheter to the correct place in your artery.  Dye will be injected into the catheter and will travel to the narrow or blocked part of your carotid artery.  X-ray images will be taken of how the dye flows through your artery. While the images are   being taken, you may be given instructions about breathing, swallowing, moving, or talking.  A filter (distal protection device) will be inserted into your artery. This will be used to catch plaque that comes loose in your artery during the procedure. This reduces the risk of plaque moving into your brain.  A small balloon will be inserted into your  artery. The balloon will be inflated for a few seconds to widen your artery and will then be removed.  The stent will be placed in your artery.  A second small balloon will be inserted into your artery and inflated. This expands the stent inside of your artery so that the stent holds up the artery walls. The balloon will then be removed.  The catheter and the distal protection device will be removed from your artery.  Your incision may be closed with stitches (sutures), skin glue, or adhesive tape.  A bandage (dressing) will be placed over your incision. The procedure may vary among health care providers and hospitals.   What happens after the procedure?  Your blood pressure, heart rate, breathing rate, and blood oxygen level will be monitored until you leave the hospital or clinic.  You may continue to receive fluids and medicines through an IV.  You may need to have pressure placed on the incision site to prevent bleeding.  You will need to keep the area still for a few hours, or as long as directed by your health care provider. If the procedure was done in the groin, you will be instructed not to bend or cross your legs.  You may have some pain. Pain medicines will be available to help you.  You may have a test that uses sound waves to take pictures (ultrasound) of the carotid artery. This can be compared to future tests to check for changes in the artery.  Do not drive for 24 hours. Summary  Carotid angioplasty with stent is a procedure to open or widen an artery in the neck (carotid artery) that has become narrowed.  The procedure is done to lower the risk of problems that can result from reduced blood flow to the brain, including a stroke.  The stent placed inside the artery will help keep the artery open by supporting the artery walls.  Follow instructions from your health care provider about taking medicines and about eating and drinking before the procedure. This  information is not intended to replace advice given to you by your health care provider. Make sure you discuss any questions you have with your health care provider. Document Revised: 02/25/2018 Document Reviewed: 02/20/2018 Elsevier Patient Education  2021 Elsevier Inc.  

## 2020-06-06 ENCOUNTER — Telehealth (INDEPENDENT_AMBULATORY_CARE_PROVIDER_SITE_OTHER): Payer: Self-pay

## 2020-06-06 NOTE — Telephone Encounter (Signed)
Spoke with the patient and he is scheduled with Dr. Wyn Quaker for a left carotid stent placement on 06/16/20 with a 6:45 am arrival time to the [facility]. Covid testing on 06/14/20 between 8-1 pm at the [facility]. Pre-procedure instructions were discussed and will be mailed.

## 2020-06-10 NOTE — Telephone Encounter (Signed)
Patient's sister left a message stating that the patient's procedure that is scheduled with Dr. Wyn Quaker on 06/16/20 will have to be rescheduled because they pay bills on that day. I attempted to contact the patient and his sister and both of them have voicemail boxes that are full so no way to leave a message. Patient has been canceled off the schedule for 06/16/20.

## 2020-06-14 ENCOUNTER — Other Ambulatory Visit: Payer: Medicare Other

## 2020-06-16 ENCOUNTER — Other Ambulatory Visit
Admission: RE | Admit: 2020-06-16 | Discharge: 2020-06-16 | Disposition: A | Discharge status: Home / Self Care | Payer: Medicare Other | Source: Ambulatory Visit | Attending: Vascular Surgery | Admitting: Vascular Surgery

## 2020-06-16 ENCOUNTER — Other Ambulatory Visit: Payer: Self-pay

## 2020-06-16 DIAGNOSIS — Z20822 Contact with and (suspected) exposure to covid-19: Secondary | ICD-10-CM | POA: Insufficient documentation

## 2020-06-16 DIAGNOSIS — Z01812 Encounter for preprocedural laboratory examination: Secondary | ICD-10-CM | POA: Insufficient documentation

## 2020-06-16 DIAGNOSIS — I6522 Occlusion and stenosis of left carotid artery: Secondary | ICD-10-CM

## 2020-06-17 LAB — SARS CORONAVIRUS 2 (TAT 6-24 HRS): SARS Coronavirus 2: NEGATIVE

## 2020-06-20 ENCOUNTER — Encounter
Admission: RE | Disposition: A | Discharge status: Home / Self Care | Payer: Self-pay | Source: Home / Self Care | Attending: Vascular Surgery

## 2020-06-20 ENCOUNTER — Other Ambulatory Visit: Payer: Self-pay

## 2020-06-20 ENCOUNTER — Encounter: Payer: Self-pay | Admitting: Vascular Surgery

## 2020-06-20 ENCOUNTER — Other Ambulatory Visit (INDEPENDENT_AMBULATORY_CARE_PROVIDER_SITE_OTHER): Payer: Self-pay | Admitting: Nurse Practitioner

## 2020-06-20 ENCOUNTER — Inpatient Hospital Stay
Admission: RE | Admit: 2020-06-20 | Discharge: 2020-06-21 | DRG: 036 | Disposition: A | Discharge status: Home / Self Care | Payer: Medicare Other | Attending: Vascular Surgery | Admitting: Vascular Surgery

## 2020-06-20 DIAGNOSIS — Z7982 Long term (current) use of aspirin: Secondary | ICD-10-CM | POA: Diagnosis not present

## 2020-06-20 DIAGNOSIS — Z87891 Personal history of nicotine dependence: Secondary | ICD-10-CM

## 2020-06-20 DIAGNOSIS — Z79899 Other long term (current) drug therapy: Secondary | ICD-10-CM | POA: Diagnosis not present

## 2020-06-20 DIAGNOSIS — I6522 Occlusion and stenosis of left carotid artery: Secondary | ICD-10-CM | POA: Diagnosis present

## 2020-06-20 DIAGNOSIS — I252 Old myocardial infarction: Secondary | ICD-10-CM

## 2020-06-20 DIAGNOSIS — Z7902 Long term (current) use of antithrombotics/antiplatelets: Secondary | ICD-10-CM | POA: Diagnosis not present

## 2020-06-20 DIAGNOSIS — I10 Essential (primary) hypertension: Secondary | ICD-10-CM | POA: Diagnosis present

## 2020-06-20 DIAGNOSIS — Z8249 Family history of ischemic heart disease and other diseases of the circulatory system: Secondary | ICD-10-CM

## 2020-06-20 DIAGNOSIS — Z83438 Family history of other disorder of lipoprotein metabolism and other lipidemia: Secondary | ICD-10-CM | POA: Diagnosis not present

## 2020-06-20 DIAGNOSIS — E119 Type 2 diabetes mellitus without complications: Secondary | ICD-10-CM | POA: Diagnosis present

## 2020-06-20 DIAGNOSIS — Z955 Presence of coronary angioplasty implant and graft: Secondary | ICD-10-CM

## 2020-06-20 DIAGNOSIS — E785 Hyperlipidemia, unspecified: Secondary | ICD-10-CM | POA: Diagnosis present

## 2020-06-20 DIAGNOSIS — I2510 Atherosclerotic heart disease of native coronary artery without angina pectoris: Secondary | ICD-10-CM | POA: Diagnosis present

## 2020-06-20 HISTORY — PX: CAROTID PTA/STENT INTERVENTION: CATH118231

## 2020-06-20 HISTORY — DX: Type 2 diabetes mellitus without complications: E11.9

## 2020-06-20 LAB — CREATININE, SERUM
Creatinine, Ser: 1.36 mg/dL — ABNORMAL HIGH (ref 0.61–1.24)
GFR, Estimated: 52 mL/min — ABNORMAL LOW (ref >60)

## 2020-06-20 LAB — POCT ACTIVATED CLOTTING TIME: Activated Clotting Time: 255 seconds

## 2020-06-20 LAB — BUN: BUN: 26 mg/dL — ABNORMAL HIGH (ref 8–23)

## 2020-06-20 SURGERY — CAROTID PTA/STENT INTERVENTION
Anesthesia: Moderate Sedation | Laterality: Left

## 2020-06-20 MED ORDER — DIPHENHYDRAMINE HCL 50 MG/ML IJ SOLN: 50.0000 mg | Freq: Once | INTRAMUSCULAR | Status: DC | PRN

## 2020-06-20 MED ORDER — OXYCODONE-ACETAMINOPHEN 5-325 MG PO TABS: 1.0000 | ORAL_TABLET | ORAL | Status: DC | PRN

## 2020-06-20 MED ORDER — POTASSIUM CHLORIDE CRYS ER 20 MEQ PO TBCR: 20.0000 meq | EXTENDED_RELEASE_TABLET | Freq: Every day | ORAL | Status: DC | PRN

## 2020-06-20 MED ORDER — MORPHINE SULFATE (PF) 4 MG/ML IV SOLN: 2.0000 mg | INTRAVENOUS | Status: DC | PRN

## 2020-06-20 MED ORDER — VITAMIN B-12 1000 MCG PO TABS
1000.0000 ug | ORAL_TABLET | Freq: Every day | ORAL | Status: DC
  Administered 2020-06-21: 1000 ug via ORAL
  Filled 2020-06-20: qty 1

## 2020-06-20 MED ORDER — HEPARIN SODIUM (PORCINE) 1000 UNIT/ML IJ SOLN
INTRAMUSCULAR | Status: AC
  Filled 2020-06-20: qty 2

## 2020-06-20 MED ORDER — GUAIFENESIN-DM 100-10 MG/5ML PO SYRP
15.0000 mL | ORAL_SOLUTION | ORAL | Status: DC | PRN
  Filled 2020-06-20: qty 15

## 2020-06-20 MED ORDER — LABETALOL HCL 5 MG/ML IV SOLN: 10.0000 mg | INTRAVENOUS | Status: DC | PRN

## 2020-06-20 MED ORDER — MAGNESIUM SULFATE 2 GM/50ML IV SOLN
2.0000 g | Freq: Every day | INTRAVENOUS | Status: DC | PRN
  Filled 2020-06-20: qty 50

## 2020-06-20 MED ORDER — METOPROLOL TARTRATE 50 MG PO TABS: 25.0000 mg | ORAL_TABLET | Freq: Two times a day (BID) | ORAL | Status: DC

## 2020-06-20 MED ORDER — ACETAMINOPHEN 325 MG PO TABS: 325.0000 mg | ORAL_TABLET | ORAL | Status: DC | PRN

## 2020-06-20 MED ORDER — MIDAZOLAM HCL 2 MG/2ML IJ SOLN
INTRAMUSCULAR | Status: DC | PRN
  Administered 2020-06-20: 2 mg via INTRAVENOUS

## 2020-06-20 MED ORDER — ACETAMINOPHEN 500 MG PO TABS: 500.0000 mg | ORAL_TABLET | Freq: Four times a day (QID) | ORAL | Status: DC | PRN

## 2020-06-20 MED ORDER — CEFAZOLIN SODIUM-DEXTROSE 2-4 GM/100ML-% IV SOLN: 2.0000 g | Freq: Once | INTRAVENOUS | Status: AC

## 2020-06-20 MED ORDER — FENTANYL CITRATE (PF) 100 MCG/2ML IJ SOLN
INTRAMUSCULAR | Status: AC
  Filled 2020-06-20: qty 2

## 2020-06-20 MED ORDER — HYDROMORPHONE HCL 1 MG/ML IJ SOLN: 1.0000 mg | Freq: Once | INTRAMUSCULAR | Status: DC | PRN

## 2020-06-20 MED ORDER — NITROGLYCERIN 0.4 MG SL SUBL: 0.4000 mg | SUBLINGUAL_TABLET | SUBLINGUAL | Status: DC | PRN

## 2020-06-20 MED ORDER — HYDRALAZINE HCL 20 MG/ML IJ SOLN: 5.0000 mg | INTRAMUSCULAR | Status: DC | PRN

## 2020-06-20 MED ORDER — METHYLPREDNISOLONE SODIUM SUCC 125 MG IJ SOLR: 125.0000 mg | Freq: Once | INTRAMUSCULAR | Status: DC | PRN

## 2020-06-20 MED ORDER — SODIUM CHLORIDE 0.9 % IV SOLN: 500.0000 mL | Freq: Once | INTRAVENOUS | Status: DC | PRN

## 2020-06-20 MED ORDER — PHENYLEPHRINE HCL (PRESSORS) 10 MG/ML IV SOLN
INTRAVENOUS | Status: AC
  Filled 2020-06-20: qty 1

## 2020-06-20 MED ORDER — NAPROXEN 250 MG PO TABS
250.0000 mg | ORAL_TABLET | Freq: Every day | ORAL | Status: DC | PRN
  Filled 2020-06-20: qty 1

## 2020-06-20 MED ORDER — FAMOTIDINE 20 MG PO TABS: 40.0000 mg | ORAL_TABLET | Freq: Once | ORAL | Status: DC | PRN

## 2020-06-20 MED ORDER — ASPIRIN EC 81 MG PO TBEC
81.0000 mg | DELAYED_RELEASE_TABLET | Freq: Every day | ORAL | Status: DC
  Administered 2020-06-21: 81 mg via ORAL
  Filled 2020-06-20 (×2): qty 1

## 2020-06-20 MED ORDER — PHENOL 1.4 % MT LIQD
1.0000 | OROMUCOSAL | Status: DC | PRN
  Filled 2020-06-20: qty 177

## 2020-06-20 MED ORDER — FENTANYL CITRATE (PF) 100 MCG/2ML IJ SOLN
INTRAMUSCULAR | Status: DC | PRN
  Administered 2020-06-20: 50 ug via INTRAVENOUS

## 2020-06-20 MED ORDER — MIDAZOLAM HCL 2 MG/ML PO SYRP: 8.0000 mg | ORAL_SOLUTION | Freq: Once | ORAL | Status: DC | PRN

## 2020-06-20 MED ORDER — CEFAZOLIN SODIUM-DEXTROSE 2-4 GM/100ML-% IV SOLN
INTRAVENOUS | Status: AC
  Administered 2020-06-20: 2 g via INTRAVENOUS
  Filled 2020-06-20: qty 100

## 2020-06-20 MED ORDER — METOPROLOL TARTRATE 5 MG/5ML IV SOLN: 2.0000 mg | INTRAVENOUS | Status: DC | PRN

## 2020-06-20 MED ORDER — ATROPINE SULFATE 1 MG/10ML IJ SOSY
PREFILLED_SYRINGE | INTRAMUSCULAR | Status: DC | PRN
  Administered 2020-06-20: 1 mg via INTRAVENOUS

## 2020-06-20 MED ORDER — ONDANSETRON HCL 4 MG/2ML IJ SOLN: 4.0000 mg | Freq: Four times a day (QID) | INTRAMUSCULAR | Status: DC | PRN

## 2020-06-20 MED ORDER — CLOPIDOGREL BISULFATE 75 MG PO TABS
75.0000 mg | ORAL_TABLET | Freq: Every day | ORAL | Status: DC
  Administered 2020-06-21: 75 mg via ORAL
  Filled 2020-06-20: qty 1

## 2020-06-20 MED ORDER — IODIXANOL 320 MG/ML IV SOLN
INTRAVENOUS | Status: DC | PRN
  Administered 2020-06-20: 70 mL

## 2020-06-20 MED ORDER — SODIUM CHLORIDE 0.9 % IV SOLN: INTRAVENOUS | Status: DC

## 2020-06-20 MED ORDER — CEFAZOLIN SODIUM-DEXTROSE 2-4 GM/100ML-% IV SOLN: 2.0000 g | Freq: Three times a day (TID) | INTRAVENOUS | Status: AC

## 2020-06-20 MED ORDER — FAMOTIDINE IN NACL 20-0.9 MG/50ML-% IV SOLN
20.0000 mg | Freq: Two times a day (BID) | INTRAVENOUS | Status: DC
  Administered 2020-06-20 – 2020-06-21 (×2): 20 mg via INTRAVENOUS
  Filled 2020-06-20 (×3): qty 50

## 2020-06-20 MED ORDER — HEPARIN SODIUM (PORCINE) 1000 UNIT/ML IJ SOLN
INTRAMUSCULAR | Status: DC | PRN
  Administered 2020-06-20: 7000 [IU] via INTRAVENOUS
  Administered 2020-06-20: 1000 [IU] via INTRAVENOUS

## 2020-06-20 MED ORDER — ALUM & MAG HYDROXIDE-SIMETH 200-200-20 MG/5ML PO SUSP: 15.0000 mL | ORAL | Status: DC | PRN

## 2020-06-20 MED ORDER — ACETAMINOPHEN 325 MG RE SUPP
325.0000 mg | RECTAL | Status: DC | PRN
  Filled 2020-06-20: qty 2

## 2020-06-20 MED ORDER — ATORVASTATIN CALCIUM 80 MG PO TABS
80.0000 mg | ORAL_TABLET | Freq: Every evening | ORAL | Status: DC
  Filled 2020-06-20: qty 1

## 2020-06-20 MED ORDER — MIDAZOLAM HCL 2 MG/2ML IJ SOLN
INTRAMUSCULAR | Status: AC
  Filled 2020-06-20: qty 2

## 2020-06-20 MED ORDER — CHOLECALCIFEROL 10 MCG (400 UNIT) PO TABS
1000.0000 [IU] | ORAL_TABLET | Freq: Every day | ORAL | Status: DC
  Administered 2020-06-21: 1000 [IU] via ORAL
  Filled 2020-06-20: qty 3

## 2020-06-20 SURGICAL SUPPLY — 17 items
BALLN VIATRAC 5X30X135 (BALLOONS) ×2
BALLOON VIATRAC 5X30X135 (BALLOONS) ×1 IMPLANT
CATH ANGIO 5F PIGTAIL 100CM (CATHETERS) ×2 IMPLANT
CATH BEACON 5 .035 100 JB1 TIP (CATHETERS) ×2 IMPLANT
DEVICE EMBOSHIELD NAV6 4.0-7.0 (FILTER) ×2 IMPLANT
DEVICE SAFEGUARD 24CM (GAUZE/BANDAGES/DRESSINGS) ×2 IMPLANT
DEVICE STARCLOSE SE CLOSURE (Vascular Products) ×2 IMPLANT
DEVICE TORQUE .025-.038 (MISCELLANEOUS) ×2 IMPLANT
GLIDEWIRE STIFF .35X180X3 HYDR (WIRE) ×2 IMPLANT
KIT CAROTID MANIFOLD (MISCELLANEOUS) ×2 IMPLANT
KIT ENCORE 26 ADVANTAGE (KITS) ×2 IMPLANT
PACK ANGIOGRAPHY (CUSTOM PROCEDURE TRAY) ×2 IMPLANT
SHEATH BRITE TIP 5FRX11 (SHEATH) ×2 IMPLANT
SHEATH SHUTTLE 6FR (SHEATH) ×2 IMPLANT
STENT XACT CAR 9-7X40X136 (Permanent Stent) ×2 IMPLANT
WIRE G VAS 035X260 STIFF (WIRE) ×2 IMPLANT
WIRE GUIDERIGHT .035X150 (WIRE) ×2 IMPLANT

## 2020-06-20 NOTE — Progress Notes (Signed)
Patient transported via bed from Special Procedures. PAD site assessed. New bleeding noted with approximately 5ml of blood running under dressing. Called down to special procedures. New pressure dressing brought up by Edwyna Ready PA. Epinephine and Dermabond applied to site by PA. New PAD placed on site by [facility] RN.

## 2020-06-20 NOTE — Interval H&P Note (Signed)
History and Physical Interval Note:  06/20/2020 10:06 AM  Thomas Sexton  has presented today for surgery, with the diagnosis of LT Carotid Stent Placement   Carotid Artery Stenosis   ABBOTT Rep cc: M Godley, S Willey   Pt to have Covid test on 2-3.  The various methods of treatment have been discussed with the patient and family. After consideration of risks, benefits and other options for treatment, the patient has consented to  Procedure(s): CAROTID PTA/STENT INTERVENTION (Left) as a surgical intervention.  The patient's history has been reviewed, patient examined, no change in status, stable for surgery.  I have reviewed the patient's chart and labs.  Questions were answered to the patient's satisfaction.     Thomas Sexton

## 2020-06-20 NOTE — Op Note (Signed)
OPERATIVE NOTE DATE: 06/20/2020  PROCEDURE: 1.  Ultrasound guidance for vascular access right femoral artery 2.  Placement of a 9 mm proximal 7 mm distal 4 cm long Exact stent with the use of the NAV-6 embolic protection device in the left carotid artery  PRE-OPERATIVE DIAGNOSIS: 1. High grade left carotid artery stenosis. 2. S/p right carotid stent  POST-OPERATIVE DIAGNOSIS:  Same as above  SURGEON: Festus Barren, MD  ASSISTANT(S):  none  ANESTHESIA: local/MCS  ESTIMATED BLOOD LOSS:  30 cc  CONTRAST: 70 cc  FLUORO TIME: 4.9 minutes  MODERATE CONSCIOUS SEDATION TIME:  Approximately 31 minutes using 2 mg of Versed and 50 mcg of Fentanyl  FINDING(S): 1.   75 to 80% ulcerated distal left common carotid artery stenosis  SPECIMEN(S):   none  INDICATIONS:   Patient is a 80 y.o. male who presents with worsening left distal common carotid artery stenosis.  The patient has previously undergone right carotid stenting and did very well with this and prefers this to surgery and carotid artery stenting was felt to be preferred to endarterectomy for that reason.  Risks and benefits were discussed and informed consent was obtained.   DESCRIPTION: After obtaining full informed written consent, the patient was brought back to the vascular suite and placed supine upon the table.  The patient received IV antibiotics prior to induction. Moderate conscious sedation was administered during a face to face encounter with the patient throughout the procedure with my supervision of the RN administering medicines and monitoring the patients vital signs and mental status throughout from the start of the procedure until the patient was taken to the recovery room.  After obtaining adequate anesthesia, the patient was prepped and draped in the standard fashion.   The right femoral artery was visualized with ultrasound and found to be widely patent. It was then accessed under direct ultrasound guidance without  difficulty with a Seldinger needle. A permanent image was recorded. A J-wire was placed and we then placed a 6 French sheath. The patient was then heparinized and a total of 8000 units of intravenous heparin were given and an ACT was checked to confirm successful anticoagulation. A pigtail catheter was then placed into the ascending aorta. This showed relatively normal origins of the great vessels with a type I aortic arch. I then selectively cannulated the left common carotid artery without difficulty with a JB 1 catheter and advanced into the mid left common carotid artery.  Cervical and cerebral carotid angiography was then performed. There was essentially no flow in the left anterior cerebral artery with relatively brisk left middle cerebral artery without a focal deficit.  The carotid bifurcation demonstrated a 75 to 80% ulcerated distal left common carotid artery stenosis with disease tracking into the proximal left internal carotid artery stenosis as well.  I then exchanged for the Amplatz Super Stiff wire. Over the Amplatz Super Stiff wire, a 6 [place] shuttle sheath was placed into the mid common carotid artery. I then used the NAV-6  Embolic protection device and crossed the lesion and parked this in the distal internal carotid artery at the base of the skull.  I then selected a 9 mm proximal 7 mm distal 4 cm long exact stent. This was deployed across the lesion encompassing it in its entirety. A 5 mm diameter by 3 cm length length balloon was used to post dilate the stent. Only about a 15-20% residual stenosis was present after angioplasty. Completion angiogram showed normal intracranial filling  without new defects. At this point I elected to terminate the procedure. The sheath was removed and StarClose closure device was deployed in the right femoral artery with excellent hemostatic result. The patient was taken to the recovery room in stable condition having tolerated the procedure  well.  COMPLICATIONS: none  CONDITION: stable  Festus Barren 06/20/2020 11:59 AM   This note was created with Dragon Medical transcription system. Any errors in dictation are purely unintentional.

## 2020-06-21 ENCOUNTER — Encounter: Payer: Self-pay | Admitting: Vascular Surgery

## 2020-06-21 DIAGNOSIS — I6522 Occlusion and stenosis of left carotid artery: Principal | ICD-10-CM

## 2020-06-21 LAB — CBC
HCT: 30.8 % — ABNORMAL LOW (ref 39.0–52.0)
Hemoglobin: 10.1 g/dL — ABNORMAL LOW (ref 13.0–17.0)
MCH: 30.9 pg (ref 26.0–34.0)
MCHC: 32.8 g/dL (ref 30.0–36.0)
MCV: 94.2 fL (ref 80.0–100.0)
Platelets: 139 10*3/uL — ABNORMAL LOW (ref 150–400)
RBC: 3.27 MIL/uL — ABNORMAL LOW (ref 4.22–5.81)
RDW: 13 % (ref 11.5–15.5)
WBC: 6.7 10*3/uL (ref 4.0–10.5)
nRBC: 0 % (ref 0.0–0.2)

## 2020-06-21 LAB — BASIC METABOLIC PANEL
Anion gap: 7 (ref 5–15)
BUN: 21 mg/dL (ref 8–23)
CO2: 24 mmol/L (ref 22–32)
Calcium: 8.7 mg/dL — ABNORMAL LOW (ref 8.9–10.3)
Chloride: 108 mmol/L (ref 98–111)
Creatinine, Ser: 1.23 mg/dL (ref 0.61–1.24)
GFR, Estimated: 59 mL/min — ABNORMAL LOW (ref >60)
Glucose, Bld: 111 mg/dL — ABNORMAL HIGH (ref 70–99)
Potassium: 3.9 mmol/L (ref 3.5–5.1)
Sodium: 139 mmol/L (ref 135–145)

## 2020-06-21 MED ORDER — METOPROLOL TARTRATE 50 MG PO TABS
ORAL_TABLET | ORAL | Status: AC
  Administered 2020-06-21: 25 mg via ORAL
  Filled 2020-06-21: qty 1

## 2020-06-21 MED ORDER — CEFAZOLIN SODIUM-DEXTROSE 2-4 GM/100ML-% IV SOLN
INTRAVENOUS | Status: AC
  Administered 2020-06-21: 2 g via INTRAVENOUS
  Filled 2020-06-21: qty 100

## 2020-06-21 NOTE — Progress Notes (Signed)
   06/21/20 0740  Clinical Encounter Type  Visited With Patient  Visit Type Initial;Social support;Psychological support;Spiritual support  Referral From Chaplain  Consult/Referral To Chaplain   Chaplain encountered Mr. Thomas Sexton, while doing rounds. PT was able to express his emotions. PT spoke of the wonderful experience he had at the hospital. Chaplain used storytelling to further connect with the pt. PT stated, "he hopes he is going home today".

## 2020-06-21 NOTE — Progress Notes (Signed)
0735: 10 mls of air removed from R femoral site PAD. Patient tolerated well. No new bleeding noted.   0840: 10 mls of air removed from R femoral site PAD. Patient tolerated well. No new bleeding noted.   0950: 10 mls of air removed from R femoral site PAD. Patient tolerated well. No new bleeding noted.   1050: 10 mls of air removed from R femoral site PAD. Patient tolerated well. No new bleeding noted.   R femoral PAD dressing removed. Site clean dry and intact. No new bleeding. Patient tolerated well. Site dressed with gauze and paper tape.

## 2020-06-21 NOTE — Discharge Instructions (Signed)
You may shower.  Please keep your groins clean and dry.  Please gently clean your groins with soap and water.  Gently pat dry. Please do not engage in any strenuous activity or lifting greater than 10 pounds until you are cleared at your first postoperative follow-up.

## 2020-06-21 NOTE — Discharge Summary (Signed)
[facility]    Discharge Summary  Patient ID:  Thomas Sexton MRN: [id number] DOB/AGE: Sep 26, 1940 80 y.o.  Admit date: 06/20/2020 Discharge date: 06/21/2020 Date of Surgery: 06/20/2020 Surgeon: Surgeon(s): Annice Needy, MD  Admission Diagnosis: Carotid stenosis, left [I65.22]  Discharge Diagnoses:  Carotid stenosis, left [I65.22]  Secondary Diagnoses: Past Medical History:  Diagnosis Date  . CAD (coronary artery disease)    a. 09/2012 Acute STEMI/Cath/PCI: LM nl, LAD 67m, 30d, D1 50, LCX mild prox plaque, OM1 100 (2.5x16 Promus Premier DES), RCA dom, 84m, PDA 30p;  b. 09/2012 Echo: EF 55-60%, PASP .  . Diabetes mellitus without complication (HCC)   . HTN (hypertension)   . Hyperlipidemia   . Tobacco abuse    Procedure(s): 06/20/20: 1.  Ultrasound guidance for vascular access right femoral artery 2.  Placement of a 9 mm proximal 7 mm distal 4 cm long Exact stent with the use of the NAV-6 embolic protection device in the left carotid artery  Discharged Condition: Good  HPI / Hospital Course:  Patient is a 80 year old male who presents with worsening left distal common carotid artery stenosis.  The patient has previously undergone right carotid stenting and did very well with this and prefers this to surgery and carotid artery stenting was felt to be preferred to endarterectomy for that reason.  Risks and benefits were discussed and informed consent was obtained. On 06/20/20 the patient underwent:  3.  Ultrasound guidance for vascular access right femoral artery 4.  Placement of a 9 mm proximal 7 mm distal 4 cm long Exact stent with the use of the NAV-6 embolic protection device in the left carotid artery  The patient tolerated the procedure was transferred from the angiography suite to the recovery room for observation overnight. Of procedure was unremarkable.  During his brief stay, the patient's diet was advanced, he was urinating independently, his  discomfort was controlled with the use of p.o. pain medication he was ambulating at baseline.  Day of discharge, the patient was afebrile with stable vital signs and essentially unremarkable physical exam.  Physical exam:  Alert and oriented x3 No acute distress Face: Symmetrical, tongue midline Neck: Trachea midline, no swelling or bruising Cardiovascular: Regular rate and rhythm Pulmonary: Clear to auscultation bilaterally Abdomen: Soft, nontender, nondistended Right groin: Access site is clean dry intact Extremity: Warm distally toes Neurological: Intact, no deficits noted  Labs: As below  Complications: None  Consults: None  Significant Diagnostic Studies: CBC Lab Results  Component Value Date   WBC 6.7 06/21/2020   HGB 10.1 (L) 06/21/2020   HCT 30.8 (L) 06/21/2020   MCV 94.2 06/21/2020   PLT 139 (L) 06/21/2020   BMET    Component Value Date/Time   NA 139 06/21/2020 0319   NA 136 09/30/2012 1623   K 3.9 06/21/2020 0319   K 4.3 09/30/2012 1623   CL 108 06/21/2020 0319   CL 103 09/30/2012 1623   CO2 24 06/21/2020 0319   CO2 27 09/30/2012 1623   GLUCOSE 111 (H) 06/21/2020 0319   GLUCOSE 131 (H) 09/30/2012 1623   BUN 21 06/21/2020 0319   BUN 23 (H) 09/30/2012 1623   CREATININE 1.23 06/21/2020 0319   CREATININE 1.92 (H) 09/30/2012 1623   CALCIUM 8.7 (L) 06/21/2020 0319   CALCIUM 9.4 09/30/2012 1623   GFRNONAA 59 (L) 06/21/2020 0319   GFRNONAA 34 (L) 09/30/2012 1623   GFRAA >60 09/03/2019 0429   GFRAA 39 (L) 09/30/2012  1623   COAG No results found for: INR, PROTIME  Disposition:  Discharge to :Home  Allergies as of 06/21/2020   No Known Allergies     Medication List    TAKE these medications   acetaminophen 500 MG tablet Commonly known as: TYLENOL Take by mouth.   aspirin 81 MG EC tablet Take 1 tablet (81 mg total) by mouth daily.   atorvastatin 80 MG tablet Commonly known as: LIPITOR TAKE 1 TABLET BY MOUTH EVERY DAY AT 6 PM   cholecalciferol  1000 units tablet Commonly known as: VITAMIN D Take 1,000 Units by mouth daily.   clopidogrel 75 MG tablet Commonly known as: PLAVIX TAKE 1 TABLET BY MOUTH EVERY DAY   metoprolol tartrate 25 MG tablet Commonly known as: LOPRESSOR TAKE 1 TABLET BY MOUTH TWICE DAILY   naproxen sodium 220 MG tablet Commonly known as: ALEVE Take by mouth.   nitroGLYCERIN 0.4 MG SL tablet Commonly known as: NITROSTAT Place 1 tablet (0.4 mg total) under the tongue every 5 (five) minutes x 3 doses as needed for chest pain.   predniSONE 20 MG tablet Commonly known as: DELTASONE Take 3 tabs by mouth daily for 3 days, 2 tabs by mouth for 4 days and 1 tab daily for 5 days   vitamin B-12 1000 MCG tablet Commonly known as: CYANOCOBALAMIN Take 1,000 mcg by mouth daily.      Verbal and written Discharge instructions given to the patient. Wound care per Discharge AVS  Follow-up Information    Dew, Marlow Baars, MD Follow up in 1 month(s).   Specialties: Vascular Surgery, Radiology, Interventional Cardiology Why: Can see Dew or Vivia Birmingham. Will need carotid duplex with visit. Contact information: [address] [place] [address] [phone]              Signed: Tonette Lederer, PA-C  06/21/2020, 2:07 PM

## 2020-06-21 NOTE — Progress Notes (Signed)
Nsg Discharge Note  Admit Date:  06/20/2020 Discharge date: 06/21/2020   NATALIO SALOIS to be D/C'd Home per MD order.  AVS completed.  Patient/caregiver able to verbalize understanding.  Discharge Medication: Allergies as of 06/21/2020   No Known Allergies     Medication List    TAKE these medications   acetaminophen 500 MG tablet Commonly known as: TYLENOL Take by mouth.   aspirin 81 MG EC tablet Take 1 tablet (81 mg total) by mouth daily.   atorvastatin 80 MG tablet Commonly known as: LIPITOR TAKE 1 TABLET BY MOUTH EVERY DAY AT 6 PM   cholecalciferol 1000 units tablet Commonly known as: VITAMIN D Take 1,000 Units by mouth daily.   clopidogrel 75 MG tablet Commonly known as: PLAVIX TAKE 1 TABLET BY MOUTH EVERY DAY   metoprolol tartrate 25 MG tablet Commonly known as: LOPRESSOR TAKE 1 TABLET BY MOUTH TWICE DAILY   naproxen sodium 220 MG tablet Commonly known as: ALEVE Take by mouth.   nitroGLYCERIN 0.4 MG SL tablet Commonly known as: NITROSTAT Place 1 tablet (0.4 mg total) under the tongue every 5 (five) minutes x 3 doses as needed for chest pain.   predniSONE 20 MG tablet Commonly known as: DELTASONE Take 3 tabs by mouth daily for 3 days, 2 tabs by mouth for 4 days and 1 tab daily for 5 days   vitamin B-12 1000 MCG tablet Commonly known as: CYANOCOBALAMIN Take 1,000 mcg by mouth daily.       Discharge Assessment: Vitals:   06/21/20 0943 06/21/20 1204  BP: (!) 107/94 107/65  Pulse: 63 (!) 101  Resp: 19 17  Temp:    SpO2: 100% 98%   Skin clean, dry and intact without evidence of skin break down, no evidence of skin tears noted. IV catheter discontinued intact. Site without signs and symptoms of complications - no redness or edema noted at insertion site, patient denies c/o pain - only slight tenderness at site.  Dressing with slight pressure applied.  D/c Instructions-Education: Discharge instructions given to patient/family with verbalized  understanding. D/c education completed with patient/family including follow up instructions, medication list, d/c activities limitations if indicated, with other d/c instructions as indicated by MD - patient able to verbalize understanding, all questions fully answered. Patient instructed to return to ED, call 911, or call MD for any changes in condition.  Patient escorted via WC, and D/C home via private auto.  Theodore Demark, RN 06/21/2020 12:29 PM

## 2020-07-28 ENCOUNTER — Other Ambulatory Visit (INDEPENDENT_AMBULATORY_CARE_PROVIDER_SITE_OTHER): Payer: Self-pay | Admitting: Vascular Surgery

## 2020-07-28 DIAGNOSIS — I6522 Occlusion and stenosis of left carotid artery: Secondary | ICD-10-CM

## 2020-07-28 DIAGNOSIS — Z959 Presence of cardiac and vascular implant and graft, unspecified: Secondary | ICD-10-CM

## 2020-08-03 ENCOUNTER — Encounter (INDEPENDENT_AMBULATORY_CARE_PROVIDER_SITE_OTHER): Payer: Medicare Other

## 2020-08-03 ENCOUNTER — Ambulatory Visit (INDEPENDENT_AMBULATORY_CARE_PROVIDER_SITE_OTHER): Payer: Medicare Other | Admitting: Nurse Practitioner

## 2020-08-05 ENCOUNTER — Emergency Department
Admission: EM | Admit: 2020-08-05 | Discharge: 2020-08-05 | Disposition: A | Discharge status: Home / Self Care | Payer: Medicaid Other | Attending: Emergency Medicine | Admitting: Emergency Medicine

## 2020-08-05 ENCOUNTER — Emergency Department: Payer: Medicaid Other

## 2020-08-05 ENCOUNTER — Other Ambulatory Visit: Payer: Self-pay

## 2020-08-05 DIAGNOSIS — R531 Weakness: Secondary | ICD-10-CM | POA: Insufficient documentation

## 2020-08-05 DIAGNOSIS — Z87891 Personal history of nicotine dependence: Secondary | ICD-10-CM | POA: Diagnosis not present

## 2020-08-05 DIAGNOSIS — M10042 Idiopathic gout, left hand: Secondary | ICD-10-CM

## 2020-08-05 DIAGNOSIS — I2510 Atherosclerotic heart disease of native coronary artery without angina pectoris: Secondary | ICD-10-CM | POA: Insufficient documentation

## 2020-08-05 DIAGNOSIS — M10041 Idiopathic gout, right hand: Secondary | ICD-10-CM | POA: Insufficient documentation

## 2020-08-05 DIAGNOSIS — I10 Essential (primary) hypertension: Secondary | ICD-10-CM | POA: Insufficient documentation

## 2020-08-05 DIAGNOSIS — Z7982 Long term (current) use of aspirin: Secondary | ICD-10-CM | POA: Diagnosis not present

## 2020-08-05 DIAGNOSIS — E119 Type 2 diabetes mellitus without complications: Secondary | ICD-10-CM | POA: Diagnosis not present

## 2020-08-05 DIAGNOSIS — Z79899 Other long term (current) drug therapy: Secondary | ICD-10-CM | POA: Insufficient documentation

## 2020-08-05 DIAGNOSIS — M79642 Pain in left hand: Secondary | ICD-10-CM | POA: Diagnosis present

## 2020-08-05 LAB — CBC
HCT: 37.9 % — ABNORMAL LOW (ref 39.0–52.0)
Hemoglobin: 12.5 g/dL — ABNORMAL LOW (ref 13.0–17.0)
MCH: 30.5 pg (ref 26.0–34.0)
MCHC: 33 g/dL (ref 30.0–36.0)
MCV: 92.4 fL (ref 80.0–100.0)
Platelets: 237 10*3/uL (ref 150–400)
RBC: 4.1 MIL/uL — ABNORMAL LOW (ref 4.22–5.81)
RDW: 12.6 % (ref 11.5–15.5)
WBC: 11.1 10*3/uL — ABNORMAL HIGH (ref 4.0–10.5)
nRBC: 0 % (ref 0.0–0.2)

## 2020-08-05 LAB — BASIC METABOLIC PANEL
Anion gap: 12 (ref 5–15)
BUN: 30 mg/dL — ABNORMAL HIGH (ref 8–23)
CO2: 21 mmol/L — ABNORMAL LOW (ref 22–32)
Calcium: 9.3 mg/dL (ref 8.9–10.3)
Chloride: 106 mmol/L (ref 98–111)
Creatinine, Ser: 1.53 mg/dL — ABNORMAL HIGH (ref 0.61–1.24)
GFR, Estimated: 45 mL/min — ABNORMAL LOW (ref >60)
Glucose, Bld: 221 mg/dL — ABNORMAL HIGH (ref 70–99)
Potassium: 4.1 mmol/L (ref 3.5–5.1)
Sodium: 139 mmol/L (ref 135–145)

## 2020-08-05 LAB — URIC ACID: Uric Acid, Serum: 9.2 mg/dL — ABNORMAL HIGH (ref 3.7–8.6)

## 2020-08-05 MED ORDER — NAPROXEN 500 MG PO TABS: 500.0000 mg | ORAL_TABLET | Freq: Two times a day (BID) | ORAL | 0 refills | Status: AC

## 2020-08-05 MED ORDER — COLCHICINE 0.6 MG PO TABS
0.6000 mg | ORAL_TABLET | ORAL | Status: AC
  Administered 2020-08-05: 0.6 mg via ORAL
  Filled 2020-08-05: qty 1

## 2020-08-05 MED ORDER — SODIUM CHLORIDE 0.9 % IV BOLUS
1000.0000 mL | Freq: Once | INTRAVENOUS | Status: AC
  Administered 2020-08-05: 1000 mL via INTRAVENOUS

## 2020-08-05 NOTE — ED Notes (Signed)
Pt presents to ED with sister with c/o of L hand swelling that has been ongoing and getting worse. Pt states a HX of gout, pt denies fevers and chills. L hand is swelling but pt is able to move all fingers and denies numbness or tingling. Sister also states pt has not been ambulatory at home as he normally is, pt denies any issues with leg pain. Pt states only complaint is swollen hand. Pt is A&Ox4. Pt denies N/V/D. Pt denies chest pain or SOB.

## 2020-08-05 NOTE — ED Triage Notes (Addendum)
First Nursee NOte:  C/O left hand sweling, weakness, poor appetite.  Patient is AAOx3.  Skin warm and dry. NAD

## 2020-08-05 NOTE — ED Notes (Signed)
Warm blanket provided to pt.

## 2020-08-05 NOTE — ED Provider Notes (Addendum)
[facility] Emergency Department Provider Note  ____________________________________________  Time seen: Approximately 1:57 PM  I have reviewed the triage vital signs and the nursing notes.   HISTORY  Chief Complaint Weakness    HPI Thomas Sexton is a 80 y.o. male with a history of CAD hypertension diabetes hyperlipidemia and gout  who comes the ED complaining of left hand pain and swelling for the past week.  Denies any acute injury.  Denies fever chills chest pain or shortness of breath.  No history of DVT.  Symptoms are constant, worse with movement, no alleviating factors.  Nonradiating.  Spouse also notes decreased appetite, decreased energy level.  No vomiting or diarrhea.     Past Medical History:  Diagnosis Date  . CAD (coronary artery disease)    a. 09/2012 Acute STEMI/Cath/PCI: LM nl, LAD 64m, 30d, D1 50, LCX mild prox plaque, OM1 100 (2.5x16 Promus Premier DES), RCA dom, 60m, PDA 30p;  b. 09/2012 Echo: EF 55-60%, PASP .  . Diabetes mellitus without complication (HCC)   . HTN (hypertension)   . Hyperlipidemia   . Tobacco abuse      Patient Active Problem List   Diagnosis Date Noted  . Carotid stenosis, left 06/20/2020  . Carotid stenosis, bilateral 09/02/2019  . Loss of memory 11/29/2016  . Type 2 diabetes mellitus (HCC) 05/15/2016  . Carotid stenosis 05/31/2015  . Gout 10/27/2013  . Acute wrist pain 09/04/2013  . Hyperlipidemia 03/30/2013  . Myocardial infarction (HCC) 11/07/2012  . Coronary atherosclerosis of native coronary artery 10/03/2012  . Tobacco abuse 10/03/2012  . AKI (acute kidney injury) (HCC) 10/01/2012  . Hypertension 10/01/2012  . Hypokalemia 10/01/2012  . ST elevation myocardial infarction (STEMI) of lateral wall (HCC) 09/30/2012  . Olecranon bursitis 01/15/2012     Past Surgical History:  Procedure Laterality Date  . CARDIAC CATHETERIZATION    . CAROTID PTA/STENT INTERVENTION Right 09/02/2019    Procedure: CAROTID PTA/STENT INTERVENTION;  Surgeon: Annice Needy, MD;  Location: [facility] INVASIVE CV LAB;  Service: Cardiovascular;  Laterality: Right;  . CAROTID PTA/STENT INTERVENTION Left 06/20/2020   Procedure: CAROTID PTA/STENT INTERVENTION;  Surgeon: Annice Needy, MD;  Location: [facility] INVASIVE CV LAB;  Service: Cardiovascular;  Laterality: Left;  . CORONARY ANGIOPLASTY  09/2012   a. 09/2012 Acute STEMI/Cath/PCI: LM nl, LAD 63m, 30d, D1 50, LCX mild prox plaque, OM1 100 (2.5x16 Promus Premier DES), RCA dom, 51m, PDA 30p;  b. 09/2012 Echo: EF 55-60%, PASP .  Marland Kitchen LEFT HEART CATH  09/30/2012   Procedure: LEFT HEART CATH;  Surgeon: Kathleene Hazel, MD;  Location: [facility] CATH LAB;  Service: Cardiovascular;;  . LEFT HEART CATHETERIZATION WITH CORONARY ANGIOGRAM N/A 09/30/2012   Procedure: STEMI;  Surgeon: Kathleene Hazel, MD;  Location: [facility] CATH LAB;  Service: Cardiovascular;  Laterality: N/A;  . PERCUTANEOUS CORONARY STENT INTERVENTION (PCI-S)  09/30/2012   Procedure: PERCUTANEOUS CORONARY STENT INTERVENTION (PCI-S);  Surgeon: Kathleene Hazel, MD;  Location[facility] CATH LAB;  Service: Cardiovascular;;     Prior to Admission medications   Medication Sig Start Date End Date Taking? Authorizing Provider  naproxen (NAPROSYN) 500 MG tablet Take 1 tablet (500 mg total) by mouth 2 (two) times daily with a meal for 5 days. 08/05/20 08/10/20 Yes Sharman Cheek, MD  acetaminophen (TYLENOL) 500 MG tablet Take by mouth. 10/20/19 10/19/20  [provider]  aspirin EC 81 MG EC tablet Take 1 tablet (81 mg total) by mouth daily. 10/03/12   Brion Aliment,  Dois Davenport, NP  atorvastatin (LIPITOR) 80 MG tablet TAKE 1 TABLET BY MOUTH EVERY DAY AT 6 PM 10/27/18   Sondra Barges, PA-C  cholecalciferol (VITAMIN D) 1000 UNITS tablet Take 1,000 Units by mouth daily.    [provider]  clopidogrel (PLAVIX) 75 MG tablet TAKE 1 TABLET BY MOUTH EVERY DAY 01/21/20   Georgiana Spinner, NP  metoprolol tartrate  (LOPRESSOR) 25 MG tablet TAKE 1 TABLET BY MOUTH TWICE DAILY 11/19/17   Antonieta Iba, MD  naproxen sodium (ALEVE) 220 MG tablet Take by mouth. 10/20/19 10/19/20  [provider]  nitroGLYCERIN (NITROSTAT) 0.4 MG SL tablet Place 1 tablet (0.4 mg total) under the tongue every 5 (five) minutes x 3 doses as needed for chest pain. 10/03/12   Creig Hines, NP  predniSONE (DELTASONE) 20 MG tablet Take 3 tabs by mouth daily for 3 days, 2 tabs by mouth for 4 days and 1 tab daily for 5 days Patient not taking: Reported on 06/20/2020 03/15/20   [provider]  vitamin B-12 (CYANOCOBALAMIN) 1000 MCG tablet Take 1,000 mcg by mouth daily.    [provider]     Allergies Patient has no known allergies.   Family History  Problem Relation Age of Onset  . Heart attack Mother   . Hypertension Father   . Hyperlipidemia Father     Social History Social History   Tobacco Use  . Smoking status: Former Smoker    Packs/day: 0.50    Years: 50.00    Pack years: 25.00    Types: Cigarettes    Quit date: 09/27/2012    Years since quitting: 7.8  . Smokeless tobacco: Never Used  Substance Use Topics  . Alcohol use: No  . Drug use: No    Review of Systems  Constitutional:   No fever or chills.  Low energy ENT:   No sore throat. No rhinorrhea. Cardiovascular:   No chest pain or syncope. Respiratory:   No dyspnea or cough. Gastrointestinal:   Negative for abdominal pain, vomiting and diarrhea.  Musculoskeletal: Left hand pain and swelling All other systems reviewed and are negative except as documented above in ROS and HPI.  ____________________________________________   PHYSICAL EXAM:  VITAL SIGNS: ED Triage Vitals  Enc Vitals Group     BP 08/05/20 1113 (!) 133/92     Pulse Rate 08/05/20 1113 (!) 113     Resp 08/05/20 1113 16     Temp 08/05/20 1113 97.6 F (36.4 C)     Temp Source 08/05/20 1113 Oral     SpO2 08/05/20 1113 100 %     Weight 08/05/20 1110  135 lb (61.2 kg)     Height 08/05/20 1110 5\' 7"  (1.702 m)     Head Circumference --      Peak Flow --      Pain Score --      Pain Loc --      Pain Edu? --      Excl. in GC? --     Vital signs reviewed, nursing assessments reviewed.   Constitutional:   Alert and oriented. Non-toxic appearance. Eyes:   Conjunctivae are normal. EOMI. PERRL. ENT      Head:   Normocephalic and atraumatic.      Nose:   Wearing a mask.      Mouth/Throat:   Wearing a mask.      Neck:   No meningismus. Full ROM. Hematological/Lymphatic/Immunilogical:   No cervical lymphadenopathy.  Cardiovascular:   RRR. Symmetric bilateral radial and DP pulses.  No murmurs. Cap refill less than 2 seconds. Respiratory:   Normal respiratory effort without tachypnea/retractions. Breath sounds are clear and equal bilaterally. No wheezes/rales/rhonchi. Gastrointestinal:   Soft and nontender. Non distended. There is no CVA tenderness.  No rebound, rigidity, or guarding. Genitourinary:   deferred Musculoskeletal: Diffuse swelling of the left hand, particularly pronounced at the second MCP joint.  There is tenderness of the left second MCP joint as well.  Otherwise normal range of motion in all extremities and joints.  No erythema or warmth.  No induration, no signs of cellulitis abscess necrotizing fasciitis or osteomyelitis. Neurologic:   Normal speech and language.  Motor grossly intact. No acute focal neurologic deficits are appreciated.  Skin:    Skin is warm, dry and intact. No rash noted.  No petechiae, purpura, or bullae.  ____________________________________________    LABS (pertinent positives/negatives) (all labs ordered are listed, but only abnormal results are displayed) Labs Reviewed  BASIC METABOLIC PANEL - Abnormal; Notable for the following components:      Result Value   CO2 21 (*)    Glucose, Bld 221 (*)    BUN 30 (*)    Creatinine, Ser 1.53 (*)    GFR, Estimated 45 (*)    All other components within  normal limits  CBC - Abnormal; Notable for the following components:   WBC 11.1 (*)    RBC 4.10 (*)    Hemoglobin 12.5 (*)    HCT 37.9 (*)    All other components within normal limits  URIC ACID - Abnormal; Notable for the following components:   Uric Acid, Serum 9.2 (*)    All other components within normal limits  URINALYSIS, COMPLETE (UACMP) WITH MICROSCOPIC  CBG MONITORING, ED   ____________________________________________   EKG Interpreted by me Sinus rhythm rate of 92, normal axis and intervals.  Normal QRS ST segments and T waves.   ____________________________________________    RADIOLOGY  DG Hand Complete Left  Result Date: 08/05/2020 CLINICAL DATA:  Acute left hand pain and swelling after fall last week. EXAM: LEFT HAND - COMPLETE 3+ VIEW COMPARISON:  None. FINDINGS: There is no evidence of fracture or dislocation. There is no evidence of arthropathy or other focal bone abnormality. Two small linear densities are seen in the palmar soft tissues which may represent foreign bodies. IMPRESSION: No fracture or dislocation is noted. Two small linear densities are seen in the palmar soft tissues which may represent foreign bodies. Electronically Signed   By: Lupita RaiderJames  Green Jr M.D.   On: 08/05/2020 12:39    ____________________________________________   PROCEDURES Procedures  ____________________________________________    CLINICAL IMPRESSION / ASSESSMENT AND PLAN / ED COURSE  Medications ordered in the ED: Medications  colchicine tablet 0.6 mg (has no administration in time range)  sodium chloride 0.9 % bolus 1,000 mL (1,000 mLs Intravenous Bolus 08/05/20 1320)    Pertinent labs & imaging results that were available during my care of the patient were reviewed by me and considered in my medical decision making (see chart for details).  Thomas Sexton was evaluated in Emergency Department on 08/05/2020 for the symptoms described in the history of present illness.  He was evaluated in the context of the global COVID-19 pandemic, which necessitated consideration that the patient might be at risk for infection with the SARS-CoV-2 virus that causes COVID-19. Institutional protocols and algorithms that pertain to the evaluation of patients at risk for  COVID-19 are in a state of rapid change based on information released by regulatory bodies including the CDC and federal and state organizations. These policies and algorithms were followed during the patient's care in the ED.   Patient presents with pain and swelling of the left hand, appears to be gout flare of the left second MCP.  Doubt septic arthritis.  Labs also show increased BUN and creatinine consistent with dehydration.  Patient given IV fluids, will give a dose of colchicine, limited course of naproxen and follow-up with primary care.      ____________________________________________   FINAL CLINICAL IMPRESSION(S) / ED DIAGNOSES    Final diagnoses:  Acute idiopathic gout of left hand     ED Discharge Orders         Ordered    naproxen (NAPROSYN) 500 MG tablet  2 times daily with meals        08/05/20 1356          Portions of this note were generated with dragon dictation software. Dictation errors may occur despite best attempts at proofreading.   Sharman Cheek, MD 08/05/20 1403    Sharman Cheek, MD 08/05/20 [id number]

## 2020-08-05 NOTE — ED Triage Notes (Signed)
See first nurse note- pt to ER with sister with complaints of L hand swelling x1 week, weakness accompanied by four falls (no LOC, denies hitting head), and decreased appetite.

## 2020-08-17 ENCOUNTER — Ambulatory Visit (INDEPENDENT_AMBULATORY_CARE_PROVIDER_SITE_OTHER): Payer: Medicare Other | Admitting: Nurse Practitioner

## 2020-08-17 ENCOUNTER — Encounter (INDEPENDENT_AMBULATORY_CARE_PROVIDER_SITE_OTHER): Payer: Medicare Other

## 2020-09-09 ENCOUNTER — Other Ambulatory Visit: Payer: Self-pay

## 2020-09-09 ENCOUNTER — Encounter (INDEPENDENT_AMBULATORY_CARE_PROVIDER_SITE_OTHER): Payer: Self-pay | Admitting: Nurse Practitioner

## 2020-09-09 ENCOUNTER — Ambulatory Visit (INDEPENDENT_AMBULATORY_CARE_PROVIDER_SITE_OTHER): Payer: Medicare Other | Admitting: Nurse Practitioner

## 2020-09-09 ENCOUNTER — Ambulatory Visit (INDEPENDENT_AMBULATORY_CARE_PROVIDER_SITE_OTHER): Payer: Medicare Other

## 2020-09-09 VITALS — BP 152/82 | HR 87 | Ht 68.0 in | Wt 139.0 lb

## 2020-09-09 DIAGNOSIS — I6523 Occlusion and stenosis of bilateral carotid arteries: Secondary | ICD-10-CM

## 2020-09-09 DIAGNOSIS — E119 Type 2 diabetes mellitus without complications: Secondary | ICD-10-CM

## 2020-09-09 DIAGNOSIS — I6522 Occlusion and stenosis of left carotid artery: Secondary | ICD-10-CM

## 2020-09-09 DIAGNOSIS — Z959 Presence of cardiac and vascular implant and graft, unspecified: Secondary | ICD-10-CM | POA: Diagnosis not present

## 2020-09-09 DIAGNOSIS — I10 Essential (primary) hypertension: Secondary | ICD-10-CM

## 2020-09-11 ENCOUNTER — Encounter (INDEPENDENT_AMBULATORY_CARE_PROVIDER_SITE_OTHER): Payer: Self-pay | Admitting: Nurse Practitioner

## 2020-09-11 NOTE — Progress Notes (Signed)
Subjective:    Patient ID: Thomas Sexton, male    DOB: Jan 06, 1941, 80 y.o.   MRN: [id number] Chief Complaint  Patient presents with  . Follow-up    1 Mo [facility] post PTA  Carotid stent intervention . Carotid  . Carotid    The patient is seen for follow up evaluation of carotid stenosis status post left carotid stenting on 06/20/2020.  There were no post operative problems or complications related to the surgery.  The patient denies neck or incisional pain.  The patient denies interval amaurosis fugax. There is no recent history of TIA symptoms or focal motor deficits. There is no prior documented CVA.  The patient denies headache.  The patient is taking enteric-coated aspirin 81 mg daily.  The patient has a history of coronary artery disease, no recent episodes of angina or shortness of breath. The patient denies PAD or claudication symptoms. There is a history of hyperlipidemia which is being treated with a statin.   The patient has a 1 to 39% ICA stenosis bilaterally.  The patient has a history of a right ICA stent as well.  Patient has bilateral vertebral arteries demonstrate antegrade flow.  Normal flow hemodynamics seen in the bilateral subclavian arteries.   Review of Systems  Eyes: Negative for visual disturbance.  All other systems reviewed and are negative.      Objective:   Physical Exam Vitals reviewed.  HENT:     Head: Normocephalic.  Cardiovascular:     Rate and Rhythm: Normal rate.     Pulses: Normal pulses.  Pulmonary:     Effort: Pulmonary effort is normal.  Neurological:     Mental Status: He is alert and oriented to person, place, and time.  Psychiatric:        Mood and Affect: Mood normal.        Behavior: Behavior normal.        Thought Content: Thought content normal.        Judgment: Judgment normal.     BP (!) 152/82   Pulse 87   Ht 5\' 8"  (1.727 m)   Wt 139 lb (63 kg)   BMI 21.13 kg/m   Past Medical History:  Diagnosis Date  . CAD  (coronary artery disease)    a. 09/2012 Acute STEMI/Cath/PCI: LM nl, LAD 26m, 30d, D1 50, LCX mild prox plaque, OM1 100 (2.5x16 Promus Premier DES), RCA dom, 76m, PDA 30p;  b. 09/2012 Echo: EF 55-60%, PASP 10/2012.  . Diabetes mellitus without complication (HCC)   . HTN (hypertension)   . Hyperlipidemia   . Tobacco abuse     Social History   Socioeconomic History  . Marital status: Single    Spouse name: Not on file  . Number of children: Not on file  . Years of education: Not on file  . Highest education level: Not on file  Occupational History  . Not on file  Tobacco Use  . Smoking status: Former Smoker    Packs/day: 0.50    Years: 50.00    Pack years: 25.00    Types: Cigarettes    Quit date: 09/27/2012    Years since quitting: 7.9  . Smokeless tobacco: Never Used  Substance and Sexual Activity  . Alcohol use: No  . Drug use: No  . Sexual activity: Not on file  Other Topics Concern  . Not on file  Social History Narrative  . Not on file   Social Determinants of Health  Financial Resource Strain: Not on file  Food Insecurity: Not on file  Transportation Needs: Not on file  Physical Activity: Not on file  Stress: Not on file  Social Connections: Not on file  Intimate Partner Violence: Not on file    Past Surgical History:  Procedure Laterality Date  . CARDIAC CATHETERIZATION    . CAROTID PTA/STENT INTERVENTION Right 09/02/2019   Procedure: CAROTID PTA/STENT INTERVENTION;  Surgeon: Annice Needy, MD;  Location: [facility] INVASIVE CV LAB;  Service: Cardiovascular;  Laterality: Right;  . CAROTID PTA/STENT INTERVENTION Left 06/20/2020   Procedure: CAROTID PTA/STENT INTERVENTION;  Surgeon: Annice Needy, MD;  Location: [facility] INVASIVE CV LAB;  Service: Cardiovascular;  Laterality: Left;  . CORONARY ANGIOPLASTY  09/2012   a. 09/2012 Acute STEMI/Cath/PCI: LM nl, LAD 34m, 30d, D1 50, LCX mild prox plaque, OM1 100 (2.5x16 Promus Premier DES), RCA dom, 37m, PDA 30p;  b. 09/2012 Echo: EF  55-60%, PASP .  Marland Kitchen LEFT HEART CATH  09/30/2012   Procedure: LEFT HEART CATH;  Surgeon: Kathleene Hazel, MD;  Location: [facility] CATH LAB;  Service: Cardiovascular;;  . LEFT HEART CATHETERIZATION WITH CORONARY ANGIOGRAM N/A 09/30/2012   Procedure: STEMI;  Surgeon: Kathleene Hazel, MD;  Location: [facility] CATH LAB;  Service: Cardiovascular;  Laterality: N/A;  . PERCUTANEOUS CORONARY STENT INTERVENTION (PCI-S)  09/30/2012   Procedure: PERCUTANEOUS CORONARY STENT INTERVENTION (PCI-S);  Surgeon: Kathleene Hazel, MD;  Location: [facility] CATH LAB;  Service: Cardiovascular;;    Family History  Problem Relation Age of Onset  . Heart attack Mother   . Hypertension Father   . Hyperlipidemia Father     No Known Allergies  CBC Latest Ref Rng & Units 08/05/2020 06/21/2020 09/03/2019  WBC 4.0 - 10.5 K/uL 11.1(H) 6.7 7.2  Hemoglobin 13.0 - 17.0 g/dL 12.5(L) 10.1(L) 10.2(L)  Hematocrit 39.0 - 52.0 % 37.9(L) 30.8(L) 32.5(L)  Platelets 150 - 400 K/uL 237 139(L) 177      CMP     Component Value Date/Time   NA 139 08/05/2020 1142   NA 136 09/30/2012 1623   K 4.1 08/05/2020 1142   K 4.3 09/30/2012 1623   CL 106 08/05/2020 1142   CL 103 09/30/2012 1623   CO2 21 (L) 08/05/2020 1142   CO2 27 09/30/2012 1623   GLUCOSE 221 (H) 08/05/2020 1142   GLUCOSE 131 (H) 09/30/2012 1623   BUN 30 (H) 08/05/2020 1142   BUN 23 (H) 09/30/2012 1623   CREATININE 1.53 (H) 08/05/2020 1142   CREATININE 1.92 (H) 09/30/2012 1623   CALCIUM 9.3 08/05/2020 1142   CALCIUM 9.4 09/30/2012 1623   PROT 6.9 05/31/2015 1455   PROT 7.2 09/30/2012 1623   ALBUMIN 4.6 05/31/2015 1455   ALBUMIN 3.9 09/30/2012 1623   AST 19 05/31/2015 1455   AST 164 (H) 09/30/2012 1623   ALT 12 05/31/2015 1455   ALT 41 09/30/2012 1623   ALKPHOS 74 05/31/2015 1455   ALKPHOS 74 09/30/2012 1623   BILITOT 0.6 05/31/2015 1455   BILITOT 0.6 09/30/2012 1623   GFRNONAA 45 (L) 08/05/2020 1142   GFRNONAA 34 (L) 09/30/2012 1623   GFRAA >60  09/03/2019 0429   GFRAA 39 (L) 09/30/2012 1623     No results found.     Assessment & Plan:   1. Carotid stenosis, bilateral Recommend:  The patient is s/p successful left ICA stent  Duplex ultrasound preoperatively shows 1-39% contralateral stenosis.  Continue antiplatelet therapy as prescribed Continue management of CAD, HTN and Hyperlipidemia Healthy  heart diet,  encouraged exercise at least 4 times per week  Follow up in 3 months with duplex ultrasound and physical exam based on the patient's carotid surgery    2. Primary hypertension Continue antihypertensive medications as already ordered, these medications have been reviewed and there are no changes at this time.   3. Type 2 diabetes mellitus without complication, without long-term current use of insulin (HCC) Continue hypoglycemic medications as already ordered, these medications have been reviewed and there are no changes at this time.  Hgb A1C to be monitored as already arranged by primary service    Current Outpatient Medications on File Prior to Visit  Medication Sig Dispense Refill  . acetaminophen (TYLENOL) 500 MG tablet Take by mouth.    Marland Kitchen aspirin EC 81 MG EC tablet Take 1 tablet (81 mg total) by mouth daily.    Marland Kitchen atorvastatin (LIPITOR) 80 MG tablet TAKE 1 TABLET BY MOUTH EVERY DAY AT 6 PM 90 tablet 0  . cholecalciferol (VITAMIN D) 1000 UNITS tablet Take 1,000 Units by mouth daily.    . clopidogrel (PLAVIX) 75 MG tablet TAKE 1 TABLET BY MOUTH EVERY DAY 30 tablet 5  . metoprolol tartrate (LOPRESSOR) 25 MG tablet TAKE 1 TABLET BY MOUTH TWICE DAILY 60 tablet 5  . naproxen sodium (ALEVE) 220 MG tablet Take by mouth.    . nitroGLYCERIN (NITROSTAT) 0.4 MG SL tablet Place 1 tablet (0.4 mg total) under the tongue every 5 (five) minutes x 3 doses as needed for chest pain. 25 tablet 3  . predniSONE (DELTASONE) 20 MG tablet     . vitamin B-12 (CYANOCOBALAMIN) 1000 MCG tablet Take 1,000 mcg by mouth daily.     No  current facility-administered medications on file prior to visit.    There are no Patient Instructions on file for this visit. No follow-ups on file.   Georgiana Spinner, NP

## 2020-09-12 ENCOUNTER — Other Ambulatory Visit (INDEPENDENT_AMBULATORY_CARE_PROVIDER_SITE_OTHER): Payer: Self-pay | Admitting: Nurse Practitioner

## 2020-10-15 ENCOUNTER — Other Ambulatory Visit: Payer: Self-pay | Admitting: Cardiovascular Disease

## 2020-12-08 ENCOUNTER — Other Ambulatory Visit (INDEPENDENT_AMBULATORY_CARE_PROVIDER_SITE_OTHER): Payer: Self-pay | Admitting: Nurse Practitioner

## 2020-12-08 DIAGNOSIS — I6523 Occlusion and stenosis of bilateral carotid arteries: Secondary | ICD-10-CM

## 2020-12-09 ENCOUNTER — Ambulatory Visit (INDEPENDENT_AMBULATORY_CARE_PROVIDER_SITE_OTHER): Payer: Medicare Other

## 2020-12-09 ENCOUNTER — Other Ambulatory Visit: Payer: Self-pay

## 2020-12-09 ENCOUNTER — Encounter (INDEPENDENT_AMBULATORY_CARE_PROVIDER_SITE_OTHER): Payer: Self-pay | Admitting: Nurse Practitioner

## 2020-12-09 ENCOUNTER — Ambulatory Visit (INDEPENDENT_AMBULATORY_CARE_PROVIDER_SITE_OTHER): Payer: Medicare Other | Admitting: Nurse Practitioner

## 2020-12-09 VITALS — BP 155/78 | HR 81 | Ht 68.0 in | Wt 135.0 lb

## 2020-12-09 DIAGNOSIS — E119 Type 2 diabetes mellitus without complications: Secondary | ICD-10-CM | POA: Diagnosis not present

## 2020-12-09 DIAGNOSIS — I6523 Occlusion and stenosis of bilateral carotid arteries: Secondary | ICD-10-CM | POA: Diagnosis not present

## 2020-12-09 DIAGNOSIS — I10 Essential (primary) hypertension: Secondary | ICD-10-CM

## 2020-12-09 DIAGNOSIS — I6522 Occlusion and stenosis of left carotid artery: Secondary | ICD-10-CM | POA: Diagnosis not present

## 2020-12-09 NOTE — Progress Notes (Signed)
Subjective:    Patient ID: Thomas Sexton, male    DOB: 1940/09/05, 80 y.o.   MRN: [id number] Chief Complaint  Patient presents with   Follow-up    3 Mo carotid  FU Carotid    Thomas Sexton is an 80 year old male that presents today for follow-up of his carotid artery stenosis.  Patient underwent right ICA stenting on 09/02/2019.  The left ICA was stented on 06/20/2020.  The patient denies amaurosis fugax. There is no recent history of TIA symptoms or focal motor deficits. There is no prior documented CVA.  The patient is not taking his Plavix or aspirin.  There is no history of migraine headaches. There is no history of seizures.  The patient has a history of coronary artery disease, no recent episodes of angina or shortness of breath. The patient denies PAD or claudication symptoms. There is a history of hyperlipidemia which is being treated with a statin.    Carotid Duplex done today shows 1-39%.  No change compared to last study in 09/09/20    Review of Systems  Eyes:  Negative for visual disturbance.  All other systems reviewed and are negative.     Objective:   Physical Exam Vitals reviewed.  HENT:     Head: Normocephalic.  Neck:     Vascular: No carotid bruit.  Cardiovascular:     Rate and Rhythm: Normal rate.     Pulses: Normal pulses.  Pulmonary:     Effort: Pulmonary effort is normal.  Neurological:     Mental Status: He is alert and oriented to person, place, and time.  Psychiatric:        Mood and Affect: Mood normal.        Behavior: Behavior normal.        Thought Content: Thought content normal.        Judgment: Judgment normal.    BP (!) 155/78   Pulse 81   Ht 5\' 8"  (1.727 m)   Wt 135 lb (61.2 kg)   BMI 20.53 kg/m   Past Medical History:  Diagnosis Date   CAD (coronary artery disease)    a. 09/2012 Acute STEMI/Cath/PCI: LM nl, LAD 57m, 30d, D1 50, LCX mild prox plaque, OM1 100 (2.5x16 Promus Premier DES), RCA dom, 86m, PDA 30p;  b. 09/2012  Echo: EF 55-60%, PASP 10/2012.   Diabetes mellitus without complication (HCC)    HTN (hypertension)    Hyperlipidemia    Tobacco abuse     Social History   Socioeconomic History   Marital status: Single    Spouse name: Not on file   Number of children: Not on file   Years of education: Not on file   Highest education level: Not on file  Occupational History   Not on file  Tobacco Use   Smoking status: Former    Packs/day: 0.50    Years: 50.00    Pack years: 25.00    Types: Cigarettes    Quit date: 09/27/2012    Years since quitting: 8.2   Smokeless tobacco: Never  Substance and Sexual Activity   Alcohol use: No   Drug use: No   Sexual activity: Not on file  Other Topics Concern   Not on file  Social History Narrative   Not on file   Social Determinants of Health   Financial Resource Strain: Not on file  Food Insecurity: Not on file  Transportation Needs: Not on file  Physical Activity: Not on file  Stress: Not on file  Social Connections: Not on file  Intimate Partner Violence: Not on file    Past Surgical History:  Procedure Laterality Date   CARDIAC CATHETERIZATION     CAROTID PTA/STENT INTERVENTION Right 09/02/2019   Procedure: CAROTID PTA/STENT INTERVENTION;  Surgeon: Annice Needy, MD;  Location: [facility] INVASIVE CV LAB;  Service: Cardiovascular;  Laterality: Right;   CAROTID PTA/STENT INTERVENTION Left 06/20/2020   Procedure: CAROTID PTA/STENT INTERVENTION;  Surgeon: Annice Needy, MD;  Location: [facility] INVASIVE CV LAB;  Service: Cardiovascular;  Laterality: Left;   CORONARY ANGIOPLASTY  09/2012   a. 09/2012 Acute STEMI/Cath/PCI: LM nl, LAD 76m, 30d, D1 50, LCX mild prox plaque, OM1 100 (2.5x16 Promus Premier DES), RCA dom, 58m, PDA 30p;  b. 09/2012 Echo: EF 55-60%, PASP .   LEFT HEART CATH  09/30/2012   Procedure: LEFT HEART CATH;  Surgeon: Kathleene Hazel, MD;  Location: [facility] CATH LAB;  Service: Cardiovascular;;   LEFT HEART CATHETERIZATION WITH CORONARY  ANGIOGRAM N/A 09/30/2012   Procedure: STEMI;  Surgeon: Kathleene Hazel, MD;  Location: [facility] CATH LAB;  Service: Cardiovascular;  Laterality: N/A;   PERCUTANEOUS CORONARY STENT INTERVENTION (PCI-S)  09/30/2012   Procedure: PERCUTANEOUS CORONARY STENT INTERVENTION (PCI-S);  Surgeon: Kathleene Hazel, MD;  Location: [facility] CATH LAB;  Service: Cardiovascular;;    Family History  Problem Relation Age of Onset   Heart attack Mother    Hypertension Father    Hyperlipidemia Father     No Known Allergies  CBC Latest Ref Rng & Units 08/05/2020 06/21/2020 09/03/2019  WBC 4.0 - 10.5 K/uL 11.1(H) 6.7 7.2  Hemoglobin 13.0 - 17.0 g/dL 12.5(L) 10.1(L) 10.2(L)  Hematocrit 39.0 - 52.0 % 37.9(L) 30.8(L) 32.5(L)  Platelets 150 - 400 K/uL 237 139(L) 177      CMP     Component Value Date/Time   NA 139 08/05/2020 1142   NA 136 09/30/2012 1623   K 4.1 08/05/2020 1142   K 4.3 09/30/2012 1623   CL 106 08/05/2020 1142   CL 103 09/30/2012 1623   CO2 21 (L) 08/05/2020 1142   CO2 27 09/30/2012 1623   GLUCOSE 221 (H) 08/05/2020 1142   GLUCOSE 131 (H) 09/30/2012 1623   BUN 30 (H) 08/05/2020 1142   BUN 23 (H) 09/30/2012 1623   CREATININE 1.53 (H) 08/05/2020 1142   CREATININE 1.92 (H) 09/30/2012 1623   CALCIUM 9.3 08/05/2020 1142   CALCIUM 9.4 09/30/2012 1623   PROT 6.9 05/31/2015 1455   PROT 7.2 09/30/2012 1623   ALBUMIN 4.6 05/31/2015 1455   ALBUMIN 3.9 09/30/2012 1623   AST 19 05/31/2015 1455   AST 164 (H) 09/30/2012 1623   ALT 12 05/31/2015 1455   ALT 41 09/30/2012 1623   ALKPHOS 74 05/31/2015 1455   ALKPHOS 74 09/30/2012 1623   BILITOT 0.6 05/31/2015 1455   BILITOT 0.6 09/30/2012 1623   GFRNONAA 45 (L) 08/05/2020 1142   GFRNONAA 34 (L) 09/30/2012 1623   GFRAA >60 09/03/2019 0429   GFRAA 39 (L) 09/30/2012 1623     No results found.     Assessment & Plan:   1. Carotid stenosis, left Recommend:  Given the patient's asymptomatic subcritical stenosis no further invasive testing  or surgery at this time.  Duplex ultrasound shows 1-39% stenosis bilaterally.  Patient is advised to continue Plavix and aspirin through the importance of stent patency.  Alternatively if he does not wish to take the Plavix he can take a full 325 mg  aspirin. Continue management of CAD, HTN and Hyperlipidemia Healthy heart diet,  encouraged exercise at least 4 times per week Follow up in 12 months with duplex ultrasound and physical exam    2. Type 2 diabetes mellitus without complication, without long-term current use of insulin (HCC) Continue hypoglycemic medications as already ordered, these medications have been reviewed and there are no changes at this time.  Hgb A1C to be monitored as already arranged by primary service   3. Primary hypertension Continue antihypertensive medications as already ordered, these medications have been reviewed and there are no changes at this time.    Current Outpatient Medications on File Prior to Visit  Medication Sig Dispense Refill   aspirin EC 81 MG EC tablet Take 1 tablet (81 mg total) by mouth daily.     atorvastatin (LIPITOR) 80 MG tablet TAKE 1 TABLET BY MOUTH EVERY DAY AT 6 PM 90 tablet 0   cholecalciferol (VITAMIN D) 1000 UNITS tablet Take 1,000 Units by mouth daily.     clopidogrel (PLAVIX) 75 MG tablet TAKE 1 TABLET BY MOUTH EVERY DAY 30 tablet 5   metoprolol tartrate (LOPRESSOR) 25 MG tablet TAKE 1 TABLET BY MOUTH TWICE DAILY 60 tablet 5   nitroGLYCERIN (NITROSTAT) 0.4 MG SL tablet Place 1 tablet (0.4 mg total) under the tongue every 5 (five) minutes x 3 doses as needed for chest pain. 25 tablet 3   predniSONE (DELTASONE) 20 MG tablet      vitamin B-12 (CYANOCOBALAMIN) 1000 MCG tablet Take 1,000 mcg by mouth daily.     No current facility-administered medications on file prior to visit.    There are no Patient Instructions on file for this visit. No follow-ups on file.   Georgiana Spinner, NP

## 2021-06-09 ENCOUNTER — Other Ambulatory Visit: Payer: Self-pay

## 2021-06-09 ENCOUNTER — Other Ambulatory Visit (INDEPENDENT_AMBULATORY_CARE_PROVIDER_SITE_OTHER): Payer: Self-pay | Admitting: Nurse Practitioner

## 2021-06-09 ENCOUNTER — Ambulatory Visit (INDEPENDENT_AMBULATORY_CARE_PROVIDER_SITE_OTHER): Payer: Medicare Other | Admitting: Nurse Practitioner

## 2021-06-09 ENCOUNTER — Ambulatory Visit (INDEPENDENT_AMBULATORY_CARE_PROVIDER_SITE_OTHER): Payer: Medicare Other

## 2021-06-09 ENCOUNTER — Encounter (INDEPENDENT_AMBULATORY_CARE_PROVIDER_SITE_OTHER): Payer: Self-pay | Admitting: Nurse Practitioner

## 2021-06-09 VITALS — BP 138/82 | HR 71 | Ht 68.0 in | Wt 145.0 lb

## 2021-06-09 DIAGNOSIS — E782 Mixed hyperlipidemia: Secondary | ICD-10-CM

## 2021-06-09 DIAGNOSIS — L602 Onychogryphosis: Secondary | ICD-10-CM

## 2021-06-09 DIAGNOSIS — I10 Essential (primary) hypertension: Secondary | ICD-10-CM | POA: Diagnosis not present

## 2021-06-09 DIAGNOSIS — I6523 Occlusion and stenosis of bilateral carotid arteries: Secondary | ICD-10-CM

## 2021-06-10 ENCOUNTER — Encounter (INDEPENDENT_AMBULATORY_CARE_PROVIDER_SITE_OTHER): Payer: Self-pay | Admitting: Nurse Practitioner

## 2021-06-10 NOTE — Progress Notes (Signed)
Subjective:    Patient ID: Thomas Sexton, male    DOB: [birth year], 81 y.o.   MRN: [id number] Chief Complaint  Patient presents with   Follow-up    6 Mo carotid    The patient is seen for follow up evaluation of carotid stenosis status post right ICA stent placed in 2021 with a left placed on 06/20/2020.  There were no post operative problems or complications related to the surgery.  The patient denies neck or incisional pain.  The patient denies interval amaurosis fugax. There is no recent history of TIA symptoms or focal motor deficits. There is no prior documented CVA.  The patient denies headache.  The patient is taking enteric-coated aspirin 81 mg daily.  The patient has a history of coronary artery disease, no recent episodes of angina or shortness of breath. The patient denies PAD or claudication symptoms. There is a history of hyperlipidemia which is being treated with a statin.    There is a 1 to 39% stenosis bilaterally   Review of Systems  Eyes:  Negative for visual disturbance.  All other systems reviewed and are negative.     Objective:   Physical Exam Vitals reviewed.  HENT:     Head: Normocephalic.  Cardiovascular:     Rate and Rhythm: Normal rate.     Pulses: Normal pulses.  Pulmonary:     Effort: Pulmonary effort is normal.  Skin:    General: Skin is warm and dry.  Neurological:     Mental Status: He is alert and oriented to person, place, and time.  Psychiatric:        Mood and Affect: Mood normal.        Behavior: Behavior normal.        Thought Content: Thought content normal.        Judgment: Judgment normal.    BP 138/82    Pulse 71    Ht 5\' 8"  (1.727 m)    Wt 145 lb (65.8 kg)    BMI 22.05 kg/m   Past Medical History:  Diagnosis Date   CAD (coronary artery disease)    a. 09/2012 Acute STEMI/Cath/PCI: LM nl, LAD 3036m, 30d, D1 50, LCX mild prox plaque, OM1 100 (2.5x16 Promus Premier DES), RCA dom, 4960m, PDA 30p;  b. 09/2012 Echo: EF 55-60%,  PASP 32mmHg.   Diabetes mellitus without complication (HCC)    HTN (hypertension)    Hyperlipidemia    Tobacco abuse     Social History   Socioeconomic History   Marital status: Single    Spouse name: Not on file   Number of children: Not on file   Years of education: Not on file   Highest education level: Not on file  Occupational History   Not on file  Tobacco Use   Smoking status: Former    Packs/day: 0.50    Years: 50.00    Pack years: 25.00    Types: Cigarettes    Quit date: 09/27/2012    Years since quitting: 8.7   Smokeless tobacco: Never  Substance and Sexual Activity   Alcohol use: No   Drug use: No   Sexual activity: Not on file  Other Topics Concern   Not on file  Social History Narrative   Not on file   Social Determinants of Health   Financial Resource Strain: Not on file  Food Insecurity: Not on file  Transportation Needs: Not on file  Physical Activity: Not on file  Stress:  Not on file  Social Connections: Not on file  Intimate Partner Violence: Not on file    Past Surgical History:  Procedure Laterality Date   CARDIAC CATHETERIZATION     CAROTID PTA/STENT INTERVENTION Right 09/02/2019   Procedure: CAROTID PTA/STENT INTERVENTION;  Surgeon: Algernon Huxley, MD;  Location: [place] CV LAB;  Service: Cardiovascular;  Laterality: Right;   CAROTID PTA/STENT INTERVENTION Left 06/20/2020   Procedure: CAROTID PTA/STENT INTERVENTION;  Surgeon: Algernon Huxley, MD;  Location: [place] CV LAB;  Service: Cardiovascular;  Laterality: Left;   CORONARY ANGIOPLASTY  09/2012   a. 09/2012 Acute STEMI/Cath/PCI: LM nl, LAD 69m, 30d, D1 50, LCX mild prox plaque, OM1 100 (2.5x16 Promus Premier DES), RCA dom, 15m, PDA 30p;  b. 09/2012 Echo: EF 55-60%, PASP 62mmHg.   LEFT HEART CATH  09/30/2012   Procedure: LEFT HEART CATH;  Surgeon: Burnell Blanks, MD;  Location: [facility] CATH LAB;  Service: Cardiovascular;;   LEFT HEART CATHETERIZATION WITH CORONARY ANGIOGRAM N/A  09/30/2012   Procedure: STEMI;  Surgeon: Burnell Blanks, MD;  Location: [facility] CATH LAB;  Service: Cardiovascular;  Laterality: N/A;   PERCUTANEOUS CORONARY STENT INTERVENTION (PCI-S)  09/30/2012   Procedure: PERCUTANEOUS CORONARY STENT INTERVENTION (PCI-S);  Surgeon: Burnell Blanks, MD;  Location: [facility] CATH LAB;  Service: Cardiovascular;;    Family History  Problem Relation Age of Onset   Heart attack Mother    Hypertension Father    Hyperlipidemia Father     No Known Allergies  CBC Latest Ref Rng & Units 08/05/2020 06/21/2020 09/03/2019  WBC 4.0 - 10.5 K/uL 11.1(H) 6.7 7.2  Hemoglobin 13.0 - 17.0 g/dL 12.5(L) 10.1(L) 10.2(L)  Hematocrit 39.0 - 52.0 % 37.9(L) 30.8(L) 32.5(L)  Platelets 150 - 400 K/uL 237 139(L) 177      CMP     Component Value Date/Time   NA 139 08/05/2020 1142   NA 136 09/30/2012 1623   K 4.1 08/05/2020 1142   K 4.3 09/30/2012 1623   CL 106 08/05/2020 1142   CL 103 09/30/2012 1623   CO2 21 (L) 08/05/2020 1142   CO2 27 09/30/2012 1623   GLUCOSE 221 (H) 08/05/2020 1142   GLUCOSE 131 (H) 09/30/2012 1623   BUN 30 (H) 08/05/2020 1142   BUN 23 (H) 09/30/2012 1623   CREATININE 1.53 (H) 08/05/2020 1142   CREATININE 1.92 (H) 09/30/2012 1623   CALCIUM 9.3 08/05/2020 1142   CALCIUM 9.4 09/30/2012 1623   PROT 6.9 05/31/2015 1455   PROT 7.2 09/30/2012 1623   ALBUMIN 4.6 05/31/2015 1455   ALBUMIN 3.9 09/30/2012 1623   AST 19 05/31/2015 1455   AST 164 (H) 09/30/2012 1623   ALT 12 05/31/2015 1455   ALT 41 09/30/2012 1623   ALKPHOS 74 05/31/2015 1455   ALKPHOS 74 09/30/2012 1623   BILITOT 0.6 05/31/2015 1455   BILITOT 0.6 09/30/2012 1623   GFRNONAA 45 (L) 08/05/2020 1142   GFRNONAA 34 (L) 09/30/2012 1623   GFRAA >60 09/03/2019 0429   GFRAA 39 (L) 09/30/2012 1623     No results found.     Assessment & Plan:   1. Carotid stenosis, bilateral Recommend:  Given the patient's asymptomatic subcritical stenosis no further invasive testing or surgery  at this time.  Duplex ultrasound shows 1-39% stenosis bilaterally.  Bilateral ICA stents are patent.  Continue antiplatelet therapy as prescribed Continue management of CAD, HTN and Hyperlipidemia Healthy heart diet,  encouraged exercise at least 4 times per week Follow up in  12 months with duplex ultrasound and physical exam    2. Mixed hyperlipidemia Continue statin as ordered and reviewed, no changes at this time   3. Primary hypertension Continue antihypertensive medications as already ordered, these medications have been reviewed and there are no changes at this time.    4. Thickened nails The patient's wife notes having issues with his toenails.  Will refer to podiatry for evaluation possible intervention. - Ambulatory referral to Podiatry  Current Outpatient Medications on File Prior to Visit  Medication Sig Dispense Refill   aspirin EC 81 MG EC tablet Take 1 tablet (81 mg total) by mouth daily.     atorvastatin (LIPITOR) 80 MG tablet TAKE 1 TABLET BY MOUTH EVERY DAY AT 6 PM 90 tablet 0   cholecalciferol (VITAMIN D) 1000 UNITS tablet Take 1,000 Units by mouth daily.     clopidogrel (PLAVIX) 75 MG tablet TAKE 1 TABLET BY MOUTH EVERY DAY 30 tablet 5   metoprolol tartrate (LOPRESSOR) 25 MG tablet TAKE 1 TABLET BY MOUTH TWICE DAILY 60 tablet 5   nitroGLYCERIN (NITROSTAT) 0.4 MG SL tablet Place 1 tablet (0.4 mg total) under the tongue every 5 (five) minutes x 3 doses as needed for chest pain. 25 tablet 3   predniSONE (DELTASONE) 20 MG tablet      vitamin B-12 (CYANOCOBALAMIN) 1000 MCG tablet Take 1,000 mcg by mouth daily.     No current facility-administered medications on file prior to visit.    There are no Patient Instructions on file for this visit. No follow-ups on file.   Kris Hartmann, NP

## 2021-06-12 ENCOUNTER — Other Ambulatory Visit (INDEPENDENT_AMBULATORY_CARE_PROVIDER_SITE_OTHER): Payer: Self-pay | Admitting: Nurse Practitioner

## 2021-06-20 ENCOUNTER — Ambulatory Visit: Payer: Medicare Other | Admitting: Podiatry

## 2021-07-04 ENCOUNTER — Encounter: Payer: Self-pay | Admitting: Podiatry

## 2021-07-04 ENCOUNTER — Ambulatory Visit (INDEPENDENT_AMBULATORY_CARE_PROVIDER_SITE_OTHER): Payer: Medicare Other | Admitting: Podiatry

## 2021-07-04 ENCOUNTER — Other Ambulatory Visit: Payer: Self-pay

## 2021-07-04 DIAGNOSIS — M79674 Pain in right toe(s): Secondary | ICD-10-CM

## 2021-07-04 DIAGNOSIS — B351 Tinea unguium: Secondary | ICD-10-CM

## 2021-07-04 DIAGNOSIS — M79675 Pain in left toe(s): Secondary | ICD-10-CM | POA: Diagnosis not present

## 2021-07-04 DIAGNOSIS — E0843 Diabetes mellitus due to underlying condition with diabetic autonomic (poly)neuropathy: Secondary | ICD-10-CM | POA: Diagnosis not present

## 2021-07-04 NOTE — Progress Notes (Signed)
° °  SUBJECTIVE Patient with a history of diabetes mellitus presents to office today complaining of elongated, thickened nails that cause pain while ambulating in shoes.  Patient is unable to trim their own nails. Patient is here for further evaluation and treatment.   Past Medical History:  Diagnosis Date   CAD (coronary artery disease)    a. 09/2012 Acute STEMI/Cath/PCI: LM nl, LAD 31m, 30d, D1 50, LCX mild prox plaque, OM1 100 (2.5x16 Promus Premier DES), RCA dom, 43m, PDA 30p;  b. 09/2012 Echo: EF 55-60%, PASP 67mmHg.   Diabetes mellitus without complication ([place])    HTN (hypertension)    Hyperlipidemia    Tobacco abuse     OBJECTIVE General Patient is awake, alert, and oriented x 3 and in no acute distress. Derm Skin is dry and supple bilateral. Negative open lesions or macerations. Remaining integument unremarkable. Nails are tender, long, thickened and dystrophic with subungual debris, consistent with onychomycosis, 1-5 bilateral. No signs of infection noted. Vasc  DP and PT pedal pulses palpable bilaterally. Temperature gradient within normal limits.  Neuro Epicritic and protective threshold sensation diminished bilaterally.  Musculoskeletal Exam No symptomatic pedal deformities noted bilateral. Muscular strength within normal limits.  ASSESSMENT 1. Diabetes Mellitus w/ peripheral neuropathy 2.  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 Patient evaluated today.  Comprehensive diabetic foot exam performed today 2. Instructed to maintain good pedal hygiene and foot care. Stressed importance of controlling blood sugar.  3. Mechanical debridement of nails 1-5 bilaterally performed using a nail nipper. Filed with dremel without incident.  4. Return to clinic in 3 mos.     Edrick Kins, DPM [facility]  Dr. Edrick Kins, DPM    [address], [place] [address]                Office [phone]  Fax [phone]

## 2021-07-31 DIAGNOSIS — N1831 Chronic kidney disease, stage 3a: Secondary | ICD-10-CM | POA: Diagnosis present

## 2021-08-18 ENCOUNTER — Emergency Department
Admission: EM | Admit: 2021-08-18 | Discharge: 2021-08-18 | Disposition: A | Discharge status: Home / Self Care | Payer: Medicare Other | Attending: Emergency Medicine | Admitting: Emergency Medicine

## 2021-08-18 ENCOUNTER — Encounter: Payer: Self-pay | Admitting: Emergency Medicine

## 2021-08-18 DIAGNOSIS — Z20822 Contact with and (suspected) exposure to covid-19: Secondary | ICD-10-CM | POA: Insufficient documentation

## 2021-08-18 DIAGNOSIS — F0390 Unspecified dementia without behavioral disturbance: Secondary | ICD-10-CM | POA: Diagnosis not present

## 2021-08-18 DIAGNOSIS — I10 Essential (primary) hypertension: Secondary | ICD-10-CM | POA: Insufficient documentation

## 2021-08-18 DIAGNOSIS — R531 Weakness: Secondary | ICD-10-CM | POA: Diagnosis present

## 2021-08-18 DIAGNOSIS — I2510 Atherosclerotic heart disease of native coronary artery without angina pectoris: Secondary | ICD-10-CM | POA: Insufficient documentation

## 2021-08-18 DIAGNOSIS — R197 Diarrhea, unspecified: Secondary | ICD-10-CM | POA: Diagnosis not present

## 2021-08-18 DIAGNOSIS — E119 Type 2 diabetes mellitus without complications: Secondary | ICD-10-CM | POA: Insufficient documentation

## 2021-08-18 DIAGNOSIS — R5383 Other fatigue: Secondary | ICD-10-CM | POA: Diagnosis not present

## 2021-08-18 LAB — URINALYSIS, ROUTINE W REFLEX MICROSCOPIC
Bilirubin Urine: NEGATIVE
Glucose, UA: NEGATIVE mg/dL
Hgb urine dipstick: NEGATIVE
Ketones, ur: NEGATIVE mg/dL
Leukocytes,Ua: NEGATIVE
Nitrite: NEGATIVE
Protein, ur: NEGATIVE mg/dL
Specific Gravity, Urine: 1.019 (ref 1.005–1.030)
pH: 5 (ref 5.0–8.0)

## 2021-08-18 LAB — COMPREHENSIVE METABOLIC PANEL
ALT: 10 U/L (ref 0–44)
AST: 20 U/L (ref 15–41)
Albumin: 3.7 g/dL (ref 3.5–5.0)
Alkaline Phosphatase: 56 U/L (ref 38–126)
Anion gap: 13 (ref 5–15)
BUN: 23 mg/dL (ref 8–23)
CO2: 22 mmol/L (ref 22–32)
Calcium: 9.2 mg/dL (ref 8.9–10.3)
Chloride: 108 mmol/L (ref 98–111)
Creatinine, Ser: 1.51 mg/dL — ABNORMAL HIGH (ref 0.61–1.24)
GFR, Estimated: 46 mL/min — ABNORMAL LOW (ref >60)
Glucose, Bld: 134 mg/dL — ABNORMAL HIGH (ref 70–99)
Potassium: 3.6 mmol/L (ref 3.5–5.1)
Sodium: 143 mmol/L (ref 135–145)
Total Bilirubin: 1.1 mg/dL (ref 0.3–1.2)
Total Protein: 7.1 g/dL (ref 6.5–8.1)

## 2021-08-18 LAB — RESP PANEL BY RT-PCR (FLU A&B, COVID) ARPGX2
Influenza A by PCR: NEGATIVE
Influenza B by PCR: NEGATIVE
SARS Coronavirus 2 by RT PCR: NEGATIVE

## 2021-08-18 LAB — CBC
HCT: 35.9 % — ABNORMAL LOW (ref 39.0–52.0)
Hemoglobin: 11.4 g/dL — ABNORMAL LOW (ref 13.0–17.0)
MCH: 29.8 pg (ref 26.0–34.0)
MCHC: 31.8 g/dL (ref 30.0–36.0)
MCV: 93.7 fL (ref 80.0–100.0)
Platelets: 228 10*3/uL (ref 150–400)
RBC: 3.83 MIL/uL — ABNORMAL LOW (ref 4.22–5.81)
RDW: 12.1 % (ref 11.5–15.5)
WBC: 6 10*3/uL (ref 4.0–10.5)
nRBC: 0 % (ref 0.0–0.2)

## 2021-08-18 LAB — TSH: TSH: 4.159 u[IU]/mL (ref 0.350–4.500)

## 2021-08-18 LAB — LIPASE, BLOOD: Lipase: 67 U/L — ABNORMAL HIGH (ref 11–51)

## 2021-08-18 LAB — TROPONIN I (HIGH SENSITIVITY): Troponin I (High Sensitivity): 8 ng/L (ref <18)

## 2021-08-18 MED ORDER — LACTATED RINGERS IV BOLUS
1000.0000 mL | Freq: Once | INTRAVENOUS | Status: AC
  Administered 2021-08-18: 1000 mL via INTRAVENOUS

## 2021-08-18 NOTE — ED Notes (Signed)
Pt and wife reviewed DC papers with this RN. Pt gave verbal consent to DC ?

## 2021-08-18 NOTE — ED Notes (Signed)
This RN attempting IV access without success ?

## 2021-08-18 NOTE — ED Notes (Signed)
Pt unable to provide urine sample at this time 

## 2021-08-18 NOTE — ED Triage Notes (Signed)
C?O frequent bowel movements and weakness, laying around a lot, x 1 week.  Patient states "I feel pretty good".  Patient also c/o decreased appetite. ?

## 2021-08-18 NOTE — Discharge Instructions (Addendum)
As we discussed please take half dose of your metoprolol 12.5 mg and take and follow-up with your doctor: ?

## 2021-08-18 NOTE — ED Provider Notes (Signed)
? ?[facility] ?Provider Note ? ? ? Event Date/Time  ? First MD Initiated Contact with Patient 08/18/21 1057   ?  (approximate) ? ? ?History  ? ?Diarrhea ? ? ?HPI ? ?Thomas Sexton is a 81 y.o. male with a past medical history of DM, HDL, CAD, remote tobacco abuse and some mild dementia currently residing with sister who is coming patient today was going to emergency room for evaluation of increased fatigue and generalized weakness associated with some diarrhea over the last couple days.  Is any falls or injuries, headache, earache sore throat, chest pain, cough breath, abdominal pain, constipation, any blood in his stool, rash or extremity pain.  No recent travel or suspicious food intake.  No recent EtOH use or illicit drug use.  He states he feels various, little better today.  No other acute concerns at this time. ? ? ? ?Past Medical History:  ?Diagnosis Date  ? CAD (coronary artery disease)   ? a. 09/2012 Acute STEMI/Cath/PCI: LM nl, LAD 72m, 30d, D1 50, LCX mild prox plaque, OM1 100 (2.5x16 Promus Premier DES), RCA dom, 12m, PDA 30p;  b. 09/2012 Echo: EF 55-60%, PASP .  ? Diabetes mellitus without complication (HCC)   ? HTN (hypertension)   ? Hyperlipidemia   ? Tobacco abuse   ? ? ? ?  ? ? ?Physical Exam  ?Triage Vital Signs: ?ED Triage Vitals  ?Enc Vitals Group  ?   BP 08/18/21 1034 131/67  ?   Pulse Rate 08/18/21 1034 61  ?   Resp 08/18/21 1034 16  ?   Temp 08/18/21 1034 98.7 ?F (37.1 ?C)  ?   Temp Source 08/18/21 1034 Oral  ?   SpO2 08/18/21 1034 98 %  ?   Weight 08/18/21 1031 141 lb (64 kg)  ?   Height 08/18/21 1031 5\' 8"  (1.727 m)  ?   Head Circumference --   ?   Peak Flow --   ?   Pain Score 08/18/21 1031 0  ?   Pain Loc --   ?   Pain Edu? --   ?   Excl. in GC? --   ? ? ?Most recent vital signs: ?Vitals:  ? 08/18/21 1315 08/18/21 1345  ?BP:  140/86  ?Pulse: (!) 45 (!) 44  ?Resp: 15 11  ?Temp:    ?SpO2: 99% 98%  ? ? ?General: Awake, no distress.  ?CV:  Good peripheral  perfusion.  2+ radial pulses.  Slightly bradycardic. ?Resp:  Normal effort.  Clear bilaterally ?Abd:  No distention.  Soft throughout.  No CVA tenderness. ?Other:  Patient is at neurological baseline.  Cranial nerves II to XII are grossly intact.  He has full symmetric strength throughout all extremities.  There is no back pain or tenderness. ? ? ?ED Results / Procedures / Treatments  ?Labs ?(all labs ordered are listed, but only abnormal results are displayed) ?Labs Reviewed  ?LIPASE, BLOOD - Abnormal; Notable for the following components:  ?    Result Value  ? Lipase 67 (*)   ? All other components within normal limits  ?COMPREHENSIVE METABOLIC PANEL - Abnormal; Notable for the following components:  ? Glucose, Bld 134 (*)   ? Creatinine, Ser 1.51 (*)   ? GFR, Estimated 46 (*)   ? All other components within normal limits  ?CBC - Abnormal; Notable for the following components:  ? RBC 3.83 (*)   ? Hemoglobin 11.4 (*)   ? HCT 35.9 (*)   ?  All other components within normal limits  ?URINALYSIS, ROUTINE W REFLEX MICROSCOPIC - Abnormal; Notable for the following components:  ? Color, Urine YELLOW (*)   ? APPearance CLEAR (*)   ? All other components within normal limits  ?RESP PANEL BY RT-PCR (FLU A&B, COVID) ARPGX2  ?GASTROINTESTINAL PANEL BY PCR, STOOL (REPLACES STOOL CULTURE)  ?C DIFFICILE QUICK SCREEN W PCR REFLEX    ?TSH  ?TROPONIN I (HIGH SENSITIVITY)  ? ? ? ?EKG ? ?ECG is remarkable for sinus bradycardia with a rate of 49, normal axis, unremarkable intervals with nonspecific ST changes in lateral leads V5 and V6 as well as in inferior leads.  No other clear evidence of acute ischemia. ? ?RADIOLOGY ? ? ?PROCEDURES: ? ?Critical Care performed: No ? ?.1-3 Lead EKG Interpretation ?Performed by: Gilles Chiquito, MD ?Authorized by: Gilles Chiquito, MD  ? ?  Interpretation: non-specific   ?  ECG rate assessment: bradycardic   ?  Rhythm: sinus rhythm   ?  Ectopy: none   ?  Conduction: normal   ? ?The patient is on the  cardiac monitor to evaluate for evidence of arrhythmia and/or significant heart rate changes. ? ? ?MEDICATIONS ORDERED IN ED: ?Medications  ?lactated ringers bolus 1,000 mL (1,000 mLs Intravenous New Bag/Given 08/18/21 1318)  ? ? ? ?IMPRESSION / MDM / ASSESSMENT AND PLAN / ED COURSE  ?I reviewed the triage vital signs and the nursing notes. ?             ?               ? ?Differential diagnosis includes, but is not limited to infectious enteritis, metabolic derangements, cystitis, diverticulitis, arrhythmia, and ACS.  No exam features are historical findings to suggest CVA or acute traumatic injury. ? ? ?ECG is remarkable for sinus bradycardia with a rate of 49, normal axis, unremarkable intervals with nonspecific ST changes in lateral leads V5 and V6 as well as in inferior leads.  No other clear evidence of acute ischemia.  Troponin is nonelevated 8.  Certainly possible his bradycardia is contributing to feeling of overall weakness he does not feel lightheaded or dizzy and is able to ambulate without any dizziness.  He is not orthostatic walking around his room.  Unclear if this is primary cause of his overall feeling weak think it is reasonable to decrease his dose to 12.5 until he can get seen by his PCP or cardiologist again.  His TSH is within normal limits.  CMP without any significant electrolyte or metabolic derangements.  Kidney function is at baseline at 1.51 compared to 1.531-year ago.  Lipase not suggestive of acute pancreatitis.  CBC without leukocytosis or acute anemia.  UA is not suggestive of cystitis and has no blood or hemoglobin. ? ?Several reassessment patient states he is feeling "good" and is not any acute complaints.  He has not had any diarrhea today and is unable to provide a stool sample emergency room. ? ?I considered observation additional diagnostic testing at this point I think patient stable for discharge with close outpatient PCP and cardiology follow-up.  Discussed with the sister and  patient decreasing his metoprolol dose until he can get seen by his PCP or cardiologist.  They are in agreement with plan.  Discussed returning for new or worsening of symptoms.  Discharged in stable condition.  Strict return precautions advised and discussed. ?  ? ? ?FINAL CLINICAL IMPRESSION(S) / ED DIAGNOSES  ? ?Final diagnoses:  ?Weakness  ? ? ? ?  Rx / DC Orders  ? ?ED Discharge Orders   ? ? None  ? ?  ? ? ? ?Note:  This document was prepared using Dragon voice recognition software and may include unintentional dictation errors. ?  ?Gilles ChiquitoSmith, Nabeel Gladson P, MD ?08/18/21 1525 ? ?

## 2021-09-20 ENCOUNTER — Telehealth (INDEPENDENT_AMBULATORY_CARE_PROVIDER_SITE_OTHER): Payer: Self-pay

## 2021-09-20 NOTE — Telephone Encounter (Signed)
Patient sister Eber Jones) left message requesting refill for Clopidogrel to sent to [facility]. Patient sister was made aware that refill has been fax and she informed that [facility] will give him enough until he receives the prescription through mail. ?

## 2021-10-05 ENCOUNTER — Encounter: Payer: Self-pay | Admitting: Podiatry

## 2021-10-05 ENCOUNTER — Ambulatory Visit (INDEPENDENT_AMBULATORY_CARE_PROVIDER_SITE_OTHER): Payer: Medicare Other | Admitting: Podiatry

## 2021-10-05 DIAGNOSIS — D689 Coagulation defect, unspecified: Secondary | ICD-10-CM | POA: Diagnosis not present

## 2021-10-05 DIAGNOSIS — M79675 Pain in left toe(s): Secondary | ICD-10-CM | POA: Diagnosis not present

## 2021-10-05 DIAGNOSIS — E0843 Diabetes mellitus due to underlying condition with diabetic autonomic (poly)neuropathy: Secondary | ICD-10-CM | POA: Diagnosis not present

## 2021-10-05 DIAGNOSIS — B351 Tinea unguium: Secondary | ICD-10-CM

## 2021-10-05 DIAGNOSIS — M79674 Pain in right toe(s): Secondary | ICD-10-CM

## 2021-10-05 NOTE — Progress Notes (Signed)
This patient returns to my office for at risk foot care.  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 This patient is unable to cut nails himself since the patient cannot reach his nails.These nails are painful walking and wearing shoes.  This patient presents for at risk foot care today.  General Appearance  Alert, conversant and in no acute stress.  Vascular  Dorsalis pedis and posterior tibial  pulses are palpable  bilaterally.  Capillary return is within normal limits  bilaterally. Temperature is within normal limits  bilaterally.  Neurologic  Senn-Weinstein monofilament wire test within normal limits  bilaterally. Muscle power within normal limits bilaterally.  Nails Thick disfigured discolored nails with subungual debris  from hallux to fifth toes bilaterally. No evidence of bacterial infection or drainage bilaterally.  Orthopedic  No limitations of motion  feet .  No crepitus or effusions noted.  No bony pathology or digital deformities noted.  Skin  normotropic skin with no porokeratosis noted bilaterally.  No signs of infections or ulcers noted.     Onychomycosis  Pain in right toes  Pain in left toes  Consent was obtained for treatment procedures.   Mechanical debridement of nails 1-5  bilaterally performed with a nail nipper.  Filed with dremel without incident.    Return office visit   3 months                   Told patient to return for periodic foot care and evaluation due to potential at risk complications.   Breeanna Galgano DPM  

## 2021-10-16 ENCOUNTER — Encounter: Payer: Self-pay | Admitting: Cardiovascular Disease

## 2021-12-04 NOTE — Progress Notes (Unsigned)
Cardiology Office Note  Date:  12/05/2021   ID:  Thomas Sexton, Thomas Sexton 1941/02/04, MRN [id number]  PCP:  Antonieta Iba, MD   Chief Complaint  Patient presents with   12 month follow up     "Doing well." Medications reviewed by the patient's sister Thomas Sexton). Medications reviewed by the patient verbally.     HPI:  Mr. Nordin is a pleasant 81 year old gentleman with PMH of  CAD [facility] on Sep 30 2012 with chest pain,  STEMI, cardiac catheterization Sep 30, 2012  bare-metal stent placed to his OM1. 40% mid and 30% distal LAD disease, 50% diagonal disease, 30% RCA disease in the mid region with 30% proximal PDA disease. Troponin was greater than 20. Echo 10/01/2012 showing normal ejection fraction 55-60% Carotid ultrasound 60 to 79% b/l disease 05/2015 Recent carotid 80 to 99% stenosis on the right Carotid stent on the right April 2021 He presents today for follow-up of his coronary artery disease  Last seen by myself in clinic October 2021 Carotid stent placed on left April 2021 PV procedure with Dr. Wyn Quaker February 2022, carotid stent on right  Carotid ultrasound performed January 2023 Less than 39% disease bilaterally Carotid stent placement on the right April 2021  Completed PT at home Sedentary at home, some walking to [employer], occasional grocery shop but no regular exercise or activities at home Does lots of sitting and watching TV  No TIA or CVA Aspirin Plavix  Active walks daily No chest pain or shortness of breath concerning for angina   Labs reviewed HBA1C 5.8 CR 1.5 Total chol elevated, (off lipitor?)   EKG personally reviewed by myself on todays visit Shows sinus bradycardia rate 56 bpm no significant ST or T wave changes    PMH:   has a past medical history of CAD (coronary artery disease), Diabetes mellitus without complication (HCC), HTN (hypertension), Hyperlipidemia, and Tobacco abuse.  PSH:    Past Surgical History:  Procedure Laterality Date    CARDIAC CATHETERIZATION     CAROTID PTA/STENT INTERVENTION Right 09/02/2019   Procedure: CAROTID PTA/STENT INTERVENTION;  Surgeon: Annice Needy, MD;  Location: [facility] INVASIVE CV LAB;  Service: Cardiovascular;  Laterality: Right;   CAROTID PTA/STENT INTERVENTION Left 06/20/2020   Procedure: CAROTID PTA/STENT INTERVENTION;  Surgeon: Annice Needy, MD;  Location: [facility] INVASIVE CV LAB;  Service: Cardiovascular;  Laterality: Left;   CORONARY ANGIOPLASTY  09/2012   a. 09/2012 Acute STEMI/Cath/PCI: LM nl, LAD 66m, 30d, D1 50, LCX mild prox plaque, OM1 100 (2.5x16 Promus Premier DES), RCA dom, 88m, PDA 30p;  b. 09/2012 Echo: EF 55-60%, PASP .   LEFT HEART CATH  09/30/2012   Procedure: LEFT HEART CATH;  Surgeon: Kathleene Hazel, MD;  Location: [facility] CATH LAB;  Service: Cardiovascular;;   LEFT HEART CATHETERIZATION WITH CORONARY ANGIOGRAM N/A 09/30/2012   Procedure: STEMI;  Surgeon: Kathleene Hazel, MD;  Location: [facility] CATH LAB;  Service: Cardiovascular;  Laterality: N/A;   PERCUTANEOUS CORONARY STENT INTERVENTION (PCI-S)  09/30/2012   Procedure: PERCUTANEOUS CORONARY STENT INTERVENTION (PCI-S);  Surgeon: Kathleene Hazel, MD;  Location: [facility] CATH LAB;  Service: Cardiovascular;;    Current Outpatient Medications  Medication Sig Dispense Refill   aspirin EC 81 MG EC tablet Take 1 tablet (81 mg total) by mouth daily.     atorvastatin (LIPITOR) 80 MG tablet TAKE 1 TABLET BY MOUTH EVERY DAY AT 6 PM 90 tablet 0   cetirizine (ZYRTEC) 10 MG tablet Take 1 tablet by  mouth daily.     cholecalciferol (VITAMIN D) 1000 UNITS tablet Take 1,000 Units by mouth daily.     clopidogrel (PLAVIX) 75 MG tablet TAKE 1 TABLET BY MOUTH EVERY DAY 30 tablet 5   metoprolol tartrate (LOPRESSOR) 25 MG tablet Take by mouth.     nitroGLYCERIN (NITROSTAT) 0.4 MG SL tablet Place 1 tablet (0.4 mg total) under the tongue every 5 (five) minutes x 3 doses as needed for chest pain. 25 tablet 3   vitamin B-12 (CYANOCOBALAMIN) 1000  MCG tablet Take 1,000 mcg by mouth daily.     predniSONE (DELTASONE) 20 MG tablet  (Patient not taking: Reported on 12/05/2021)     No current facility-administered medications for this visit.     Allergies:   Patient has no known allergies.   Social History:  The patient  reports that he quit smoking about 9 years ago. His smoking use included cigarettes. He has a 25.00 pack-year smoking history. He has never used smokeless tobacco. He reports that he does not drink alcohol and does not use drugs.   Family History:   family history includes Heart attack in his mother; Hyperlipidemia in his father; Hypertension in his father.    Review of Systems: Review of Systems  Constitutional: Negative.   Respiratory: Negative.    Cardiovascular: Negative.   Gastrointestinal: Negative.   Musculoskeletal: Negative.   Neurological: Negative.   Psychiatric/Behavioral: Negative.    All other systems reviewed and are negative.   PHYSICAL EXAM: VS:  BP (!) 128/58 (BP Location: Left Arm, Patient Position: Sitting, Cuff Size: Normal)   Pulse (!) 56   Ht 5\' 4"  (1.626 m)   Wt 127 lb 2 oz (57.7 kg)   SpO2 98%   BMI 21.82 kg/m  , BMI Body mass index is 21.82 kg/m. Constitutional:  oriented to person, place, and time. No distress.  HENT:  Head: Grossly normal Eyes:  no discharge. No scleral icterus.  Neck: No JVD, no carotid bruits  Cardiovascular: Regular rate and rhythm, no murmurs appreciated Pulmonary/Chest: Clear to auscultation bilaterally, no wheezes or rails Abdominal: Soft.  no distension.  no tenderness.  Musculoskeletal: Normal range of motion Neurological:  normal muscle tone. Coordination normal. No atrophy Skin: Skin warm and dry Psychiatric: normal affect, pleasant  Recent Labs: 08/18/2021: ALT 10; BUN 23; Creatinine, Ser 1.51; Hemoglobin 11.4; Platelets 228; Potassium 3.6; Sodium 143; TSH 4.159    Lipid Panel Lab Results  Component Value Date   CHOL 94 (L) 05/31/2015   HDL  42 05/31/2015   LDLCALC 37 05/31/2015   TRIG 75 05/31/2015      Wt Readings from Last 3 Encounters:  12/05/21 127 lb 2 oz (57.7 kg)  08/18/21 141 lb (64 kg)  06/09/21 145 lb (65.8 kg)      ASSESSMENT AND PLAN:  Coronary disease with stable angina Currently with no symptoms of angina. No further workup at this time. Continue current medication regimen. Cholesterol was elevated April 2023, stressed the importance of being compliant with his Lipitor daily.  Wife who presents with him today does his medications  Essential hypertension Blood pressure is well controlled on today's visit. No changes made to the medications.  Bilateral carotid artery stenosis Bilateral carotid stenting Most recent ultrasound less than 39% disease.  On aspirin Plavix, stressed importance of staying on his Lipitor  Tobacco abuse Stopped 2014,  Currently not smoking  Type 2 diabetes, controlled A1c well controlled, 5.7 on last check Recent weight loss  Total encounter time more than 30 minutes  Greater than 50% was spent in counseling and coordination of care with the patient    No orders of the defined types were placed in this encounter.    Signed, Dossie Arbour, M.D., Ph.D. 12/05/2021  [facility] [place], [place] [phone]

## 2021-12-05 ENCOUNTER — Encounter: Payer: Self-pay | Admitting: Cardiovascular Disease

## 2021-12-05 ENCOUNTER — Ambulatory Visit (INDEPENDENT_AMBULATORY_CARE_PROVIDER_SITE_OTHER): Payer: Medicare Other | Admitting: Cardiovascular Disease

## 2021-12-05 VITALS — BP 128/58 | HR 56 | Ht 64.0 in | Wt 127.1 lb

## 2021-12-05 DIAGNOSIS — I6523 Occlusion and stenosis of bilateral carotid arteries: Secondary | ICD-10-CM

## 2021-12-05 DIAGNOSIS — I10 Essential (primary) hypertension: Secondary | ICD-10-CM

## 2021-12-05 DIAGNOSIS — I739 Peripheral vascular disease, unspecified: Secondary | ICD-10-CM

## 2021-12-05 DIAGNOSIS — Z720 Tobacco use: Secondary | ICD-10-CM | POA: Diagnosis not present

## 2021-12-05 DIAGNOSIS — I25118 Atherosclerotic heart disease of native coronary artery with other forms of angina pectoris: Secondary | ICD-10-CM | POA: Diagnosis not present

## 2021-12-05 DIAGNOSIS — E785 Hyperlipidemia, unspecified: Secondary | ICD-10-CM

## 2021-12-05 MED ORDER — METOPROLOL TARTRATE 25 MG PO TABS: 25.0000 mg | ORAL_TABLET | Freq: Two times a day (BID) | ORAL | 3 refills | Status: DC

## 2021-12-05 MED ORDER — ATORVASTATIN CALCIUM 80 MG PO TABS: 80.0000 mg | ORAL_TABLET | Freq: Every day | ORAL | 3 refills | Status: DC

## 2021-12-05 NOTE — Patient Instructions (Signed)
Medication Instructions:  No changes  If you need a refill on your cardiac medications before your next appointment, please call your pharmacy.   Lab work: No new labs needed  Testing/Procedures: No new testing needed  Follow-Up: At [facility], you and your health needs are our priority.  As part of our continuing mission to provide you with exceptional heart care, we have created designated Provider Care Teams.  These Care Teams include your primary Cardiologist (physician) and Advanced Practice Providers (APPs -  Physician Assistants and Nurse Practitioners) who all work together to provide you with the care you need, when you need it.  You will need a follow up appointment in 12 months  Providers on your designated Care Team:   Christopher Berge, NP Ryan Dunn, PA-C Cadence Furth, PA-C  COVID-19 Vaccine Information can be found at: [email or web address] For questions related to vaccine distribution or appointments, please email [email or web address] or call [phone].   

## 2022-01-18 ENCOUNTER — Ambulatory Visit (INDEPENDENT_AMBULATORY_CARE_PROVIDER_SITE_OTHER): Payer: Medicare Other | Admitting: Podiatry

## 2022-01-18 ENCOUNTER — Encounter: Payer: Self-pay | Admitting: Podiatry

## 2022-01-18 DIAGNOSIS — B351 Tinea unguium: Secondary | ICD-10-CM

## 2022-01-18 DIAGNOSIS — E0843 Diabetes mellitus due to underlying condition with diabetic autonomic (poly)neuropathy: Secondary | ICD-10-CM | POA: Diagnosis not present

## 2022-01-18 DIAGNOSIS — D689 Coagulation defect, unspecified: Secondary | ICD-10-CM

## 2022-01-18 DIAGNOSIS — M79675 Pain in left toe(s): Secondary | ICD-10-CM

## 2022-01-18 DIAGNOSIS — M79674 Pain in right toe(s): Secondary | ICD-10-CM

## 2022-01-18 NOTE — Progress Notes (Signed)
This patient returns to my office for at risk foot care.  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  This patient is unable to cut nails himself since the patient cannot reach his nails.These nails are painful walking and wearing shoes.  This patient presents for at risk foot care today.  General Appearance  Alert, conversant and in no acute stress.  Vascular  Dorsalis pedis and posterior tibial  pulses are weakly palpable  bilaterally.  Capillary return is within normal limits  bilaterally. Temperature is within normal limits  bilaterally.  Neurologic  Senn-Weinstein monofilament wire test within normal limits  bilaterally. Muscle power within normal limits bilaterally.  Nails Thick disfigured discolored nails with subungual debris  from hallux to fifth toes bilaterally. No evidence of bacterial infection or drainage bilaterally.  Orthopedic  No limitations of motion  feet .  No crepitus or effusions noted.  No bony pathology or digital deformities noted.  Skin  normotropic skin with no porokeratosis noted bilaterally.  No signs of infections or ulcers noted.     Onychomycosis  Pain in right toes  Pain in left toes  Consent was obtained for treatment procedures.   Mechanical debridement of nails 1-5  bilaterally performed with a nail nipper.  Filed with dremel without incident.    Return office visit    3 months                  Told patient to return for periodic foot care and evaluation due to potential at risk complications.   Gardiner Barefoot DPM

## 2022-04-19 ENCOUNTER — Encounter: Payer: Self-pay | Admitting: Podiatry

## 2022-04-19 ENCOUNTER — Ambulatory Visit (INDEPENDENT_AMBULATORY_CARE_PROVIDER_SITE_OTHER): Payer: Medicare Other | Admitting: Podiatry

## 2022-04-19 DIAGNOSIS — B351 Tinea unguium: Secondary | ICD-10-CM

## 2022-04-19 DIAGNOSIS — M79675 Pain in left toe(s): Secondary | ICD-10-CM | POA: Diagnosis not present

## 2022-04-19 DIAGNOSIS — D689 Coagulation defect, unspecified: Secondary | ICD-10-CM | POA: Diagnosis not present

## 2022-04-19 DIAGNOSIS — E0843 Diabetes mellitus due to underlying condition with diabetic autonomic (poly)neuropathy: Secondary | ICD-10-CM | POA: Diagnosis not present

## 2022-04-19 DIAGNOSIS — M79674 Pain in right toe(s): Secondary | ICD-10-CM

## 2022-04-19 NOTE — Progress Notes (Signed)
This patient returns to my office for at risk foot care.  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  This patient is unable to cut nails himself since the patient cannot reach his nails.These nails are painful walking and wearing shoes.  This patient presents for at risk foot care today.  General Appearance  Alert, conversant and in no acute stress.  Vascular  Dorsalis pedis and posterior tibial  pulses are weakly palpable  bilaterally.  Capillary return is within normal limits  bilaterally. Temperature is within normal limits  bilaterally.  Neurologic  Senn-Weinstein monofilament wire test within normal limits  bilaterally. Muscle power within normal limits bilaterally.  Nails Thick disfigured discolored nails with subungual debris  from hallux to fifth toes bilaterally. No evidence of bacterial infection or drainage bilaterally.  Orthopedic  No limitations of motion  feet .  No crepitus or effusions noted.  No bony pathology or digital deformities noted.  Skin  normotropic skin with no porokeratosis noted bilaterally.  No signs of infections or ulcers noted.     Onychomycosis  Pain in right toes  Pain in left toes  Consent was obtained for treatment procedures.   Mechanical debridement of nails 1-5  bilaterally performed with a nail nipper.  Filed with dremel without incident.    Return office visit    3 months                  Told patient to return for periodic foot care and evaluation due to potential at risk complications.   Gardiner Barefoot DPM

## 2022-04-23 ENCOUNTER — Ambulatory Visit
Admission: EM | Admit: 2022-04-23 | Discharge: 2022-04-23 | Disposition: A | Discharge status: Home / Self Care | Payer: Medicare Other

## 2022-04-23 DIAGNOSIS — J3489 Other specified disorders of nose and nasal sinuses: Secondary | ICD-10-CM

## 2022-04-23 DIAGNOSIS — Z972 Presence of dental prosthetic device (complete) (partial): Secondary | ICD-10-CM

## 2022-04-23 MED ORDER — IPRATROPIUM BROMIDE 0.03 % NA SOLN: 2.0000 | Freq: Two times a day (BID) | NASAL | 0 refills | Status: AC

## 2022-04-23 NOTE — Discharge Instructions (Signed)
He looks great today.  I suspect the runny nose is related to allergies.  Continue previously prescribed medication I also recommend you add Atrovent 2 sprays twice daily.  I believe the increase saliva may be related to how his dentures are fitting.  Please follow-up with his dentist.  If anything changes or worsens and he develops fever, cough, shortness of breath, nausea/vomiting he should be seen immediately.

## 2022-04-23 NOTE — ED Triage Notes (Signed)
Pt states he was referred to UC by PCP due to pt having increase runny nose & slobbering a lot ongoing for awhile per sister. Pt denies any other active sx's at this time for example, chills,muscle aches or fever.

## 2022-04-23 NOTE — ED Provider Notes (Signed)
[facility]    CSN: [id number] Arrival date & time: 04/23/22  1450      History   Chief Complaint Chief Complaint  Patient presents with   Nasal Congestion   Slobber    HPI Thomas Sexton is a 81 y.o. male.   Patient presents today accompanied by his sister however the majority of history.  Reports a prolonged (several months) history of rhinorrhea and increased saliva production.  Denies any recent medication changes.  They have seen PCP in the past and been prescribed oral antihistamine as well as fluticasone nasal spray without improvement of symptoms.  They are no longer using these medications since they were ineffective.  Denies any known sick contacts.  He denies any additional symptoms including fever, cough, congestion, nausea, vomiting, headache, dizziness.  He does wear dentures but reports he has had the same set of dentures for many years.  He does not wear them regularly and so they do not believe this is contributing to increase saliva production.  He is eating and drinking normally.  No additional complaints or concerns today.    Past Medical History:  Diagnosis Date   CAD (coronary artery disease)    a. 09/2012 Acute STEMI/Cath/PCI: LM nl, LAD 41m, 30d, D1 50, LCX mild prox plaque, OM1 100 (2.5x16 Promus Premier DES), RCA dom, 65m, PDA 30p;  b. 09/2012 Echo: EF 55-60%, PASP .   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   Loss of memory 11/29/2016   Type 2 diabetes mellitus (HCC) 05/15/2016   Carotid stenosis 05/31/2015   Gout 10/27/2013   Acute wrist pain 09/04/2013   Hyperlipidemia 03/30/2013   Myocardial infarction (HCC) 11/07/2012   Coronary atherosclerosis of native coronary artery 10/03/2012    Tobacco abuse 10/03/2012   AKI (acute kidney injury) (HCC) 10/01/2012   Hypertension 10/01/2012   Hypokalemia 10/01/2012   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 09/2012 Acute STEMI/Cath/PCI: LM nl, LAD 33m, 30d, D1 50, LCX mild prox plaque, OM1 100 (2.5x16 Promus Premier DES), RCA dom, 47m, PDA 30p;  b. 09/2012 Echo: EF 55-60%, PASP .   LEFT HEART CATH  09/30/2012   Procedure: LEFT HEART CATH;  Surgeon: Kathleene Hazel, MD;  Location: [facility] CATH LAB;  Service: Cardiovascular;;   LEFT HEART CATHETERIZATION WITH CORONARY ANGIOGRAM N/A 09/30/2012   Procedure: STEMI;  Surgeon: Kathleene Hazel, MD;  Location: [facility] CATH LAB;  Service: Cardiovascular;  Laterality: N/A;   PERCUTANEOUS CORONARY STENT INTERVENTION (PCI-S)  09/30/2012   Procedure: PERCUTANEOUS CORONARY STENT INTERVENTION (PCI-S);  Surgeon: Kathleene Hazel, MD;  Location: [facility] CATH LAB;  Service: Cardiovascular;;       Home Medications  Prior to Admission medications   Medication Sig Start Date End Date Taking? Authorizing Provider  aspirin EC 81 MG EC tablet Take 1 tablet (81 mg total) by mouth daily. 10/03/12  Yes Creig Hines, NP  atorvastatin (LIPITOR) 80 MG tablet Take 1 tablet (80 mg total) by mouth daily. 12/05/21  Yes Gollan, Tollie Pizza, MD  cetirizine (ZYRTEC) 10 MG tablet Take 1 tablet by mouth daily. 07/31/21 07/31/22  Yes [provider]  cholecalciferol (VITAMIN D) 1000 UNITS tablet Take 1,000 Units by mouth daily.   Yes [provider]  clopidogrel (PLAVIX) 75 MG tablet TAKE 1 TABLET BY MOUTH EVERY DAY 06/13/21  Yes Georgiana Spinner, NP  donepezil (ARICEPT) 10 MG tablet Take 10 mg by mouth daily.   Yes [provider]  ferrous sulfate 325 (65 FE) MG tablet Take 1 tablet by mouth daily. 01/19/22 01/19/23 Yes [provider]  fluticasone (FLONASE) 50 MCG/ACT nasal spray 1 spray into each nostril daily. 01/16/22 01/16/23 Yes [provider]  ipratropium (ATROVENT) 0.03 % nasal spray Place 2 sprays into both nostrils every 12 (twelve) hours. 04/23/22  Yes Jarad Barth K, PA-C  lisinopril (ZESTRIL) 2.5 MG tablet Take 1 tablet by mouth daily. 01/19/22 01/19/23 Yes [provider]  metoprolol tartrate (LOPRESSOR) 25 MG tablet Take 1 tablet (25 mg total) by mouth 2 (two) times daily. 12/05/21  Yes Gollan, Tollie Pizza, MD  nitroGLYCERIN (NITROSTAT) 0.4 MG SL tablet Place 1 tablet (0.4 mg total) under the tongue every 5 (five) minutes x 3 doses as needed for chest pain. 10/03/12  Yes Creig Hines, NP  vitamin B-12 (CYANOCOBALAMIN) 1000 MCG tablet Take 1,000 mcg by mouth daily.   Yes [provider]    Family History Family History  Problem Relation Age of Onset   Heart attack Mother    Hypertension Father    Hyperlipidemia Father     Social History Social History   Tobacco Use   Smoking status: Former    Packs/day: 0.50    Years: 50.00    Total pack years: 25.00    Types: Cigarettes    Quit date: 09/27/2012    Years since quitting: 9.5   Smokeless tobacco: Never  Substance Use Topics   Alcohol use: No   Drug use: No     Allergies   Patient has no known allergies.   Review of Systems Review of Systems  Constitutional:  Positive for activity change. Negative for appetite change, fatigue and fever.  HENT:  Positive for drooling and  rhinorrhea. Negative for congestion, sinus pressure, sneezing and sore throat.   Respiratory:  Negative for cough and shortness of breath.   Cardiovascular:  Negative for chest pain.  Gastrointestinal:  Negative for abdominal pain, diarrhea, nausea and vomiting.     Physical Exam Triage Vital Signs ED Triage Vitals  Enc Vitals Group     BP 04/23/22 1623 (!) 159/82     Pulse Rate 04/23/22 1623 (!) 56     Resp 04/23/22 1623 16     Temp 04/23/22 1623 98.5 F (36.9 C)     Temp Source 04/23/22 1623 Oral     SpO2 04/23/22 1623 100 %     Weight 04/23/22 1621 124 lb (56.2 kg)     Height 04/23/22 1621 5\' 6"  (1.676 m)     Head Circumference --      Peak Flow --      Pain Score 04/23/22 1621 0  Pain Loc --      Pain Edu? --      Excl. in [place]? --    No data found.  Updated Vital Signs BP (!) 159/82 (BP Location: Left Arm)   Pulse (!) 56   Temp 98.5 F (36.9 C) (Oral)   Resp 16   Ht 5\' 6"  (1.676 m)   Wt 124 lb (56.2 kg)   SpO2 100%   BMI 20.01 kg/m   Visual Acuity Right Eye Distance:   Left Eye Distance:   Bilateral Distance:    Right Eye Near:   Left Eye Near:    Bilateral Near:     Physical Exam Vitals reviewed.  Constitutional:      General: He is awake.     Appearance: Normal appearance. He is well-developed. He is not ill-appearing.     Comments: Very pleasant male appears stated age in no acute distress sitting comfortably in exam room  HENT:     Head: Normocephalic and atraumatic.     Right Ear: Tympanic membrane, ear canal and external ear normal. Tympanic membrane is not erythematous or bulging.     Left Ear: Tympanic membrane, ear canal and external ear normal. Tympanic membrane is not erythematous or bulging.     Nose: Rhinorrhea present. Rhinorrhea is clear.     Mouth/Throat:     Dentition: Has dentures.     Pharynx: Uvula midline. No oropharyngeal exudate or posterior oropharyngeal erythema.  Cardiovascular:     Rate and Rhythm: Normal rate and  regular rhythm.     Heart sounds: Normal heart sounds, S1 normal and S2 normal. No murmur heard. Pulmonary:     Effort: Pulmonary effort is normal. No accessory muscle usage or respiratory distress.     Breath sounds: Normal breath sounds. No stridor. No wheezing, rhonchi or rales.     Comments: Clear to auscultation bilaterally Neurological:     Mental Status: He is alert.  Psychiatric:        Behavior: Behavior is cooperative.      UC Treatments / Results  Labs (all labs ordered are listed, but only abnormal results are displayed) Labs Reviewed - No data to display  EKG   Radiology No results found.  Procedures Procedures (including critical care time)  Medications Ordered in UC Medications - No data to display  Initial Impression / Assessment and Plan / UC Course  I have reviewed the triage vital signs and the nursing notes.  Pertinent labs & imaging results that were available during my care of the patient were reviewed by me and considered in my medical decision making (see chart for details).     Patient is well-appearing, afebrile, nontoxic, nontachycardic, with oxygen saturation of 100%.  Chest x-ray was deferred as oxygen saturation is 100% and there were no adventitious sounds on exam.  Viral testing was deferred as he has been symptomatic for several months and this would not change management.  I suspect intermittent rhinorrhea is related to allergies.  Recommended to continue previously prescribed medication will add ipratropium for additional symptom relief.  Given bilateral presentation with lack of headache low suspicion for CNS leak.  I suspect that increased liver production is related to elevating dentures.  Encouraged him to monitor symptoms when he is wearing dentures versus not.  Discussed the importance of following up with dentist to ensure that they are fitting properly and using appropriate adhesive to manage symptoms.  Discussed that if he has any  changing or  worsening symptoms he needs to be reevaluated including chest pain, shortness of breath, fever, nausea/vomiting.  Strict return precautions given.  Final Clinical Impressions(s) / UC Diagnoses   Final diagnoses:  Rhinorrhea  Wears dentures     Discharge Instructions      He looks great today.  I suspect the runny nose is related to allergies.  Continue previously prescribed medication I also recommend you add Atrovent 2 sprays twice daily.  I believe the increase saliva may be related to how his dentures are fitting.  Please follow-up with his dentist.  If anything changes or worsens and he develops fever, cough, shortness of breath, nausea/vomiting he should be seen immediately.     ED Prescriptions     Medication Sig Dispense Auth. Provider   ipratropium (ATROVENT) 0.03 % nasal spray Place 2 sprays into both nostrils every 12 (twelve) hours. 30 mL Einar Nolasco K, PA-C      PDMP not reviewed this encounter.   Jeani Hawking, PA-C 04/23/22 1700

## 2022-06-11 ENCOUNTER — Other Ambulatory Visit (INDEPENDENT_AMBULATORY_CARE_PROVIDER_SITE_OTHER): Payer: Self-pay | Admitting: Nurse Practitioner

## 2022-06-11 DIAGNOSIS — I6523 Occlusion and stenosis of bilateral carotid arteries: Secondary | ICD-10-CM

## 2022-06-12 ENCOUNTER — Ambulatory Visit (INDEPENDENT_AMBULATORY_CARE_PROVIDER_SITE_OTHER): Payer: 59 | Admitting: Nurse Practitioner

## 2022-06-12 ENCOUNTER — Encounter (INDEPENDENT_AMBULATORY_CARE_PROVIDER_SITE_OTHER): Payer: Self-pay | Admitting: Nurse Practitioner

## 2022-06-12 ENCOUNTER — Ambulatory Visit (INDEPENDENT_AMBULATORY_CARE_PROVIDER_SITE_OTHER): Payer: 59

## 2022-06-12 VITALS — BP 159/81 | HR 60 | Resp 16 | Wt 124.0 lb

## 2022-06-12 DIAGNOSIS — I10 Essential (primary) hypertension: Secondary | ICD-10-CM

## 2022-06-12 DIAGNOSIS — I6523 Occlusion and stenosis of bilateral carotid arteries: Secondary | ICD-10-CM

## 2022-06-12 DIAGNOSIS — E782 Mixed hyperlipidemia: Secondary | ICD-10-CM | POA: Diagnosis not present

## 2022-06-23 ENCOUNTER — Other Ambulatory Visit: Payer: Self-pay

## 2022-06-23 ENCOUNTER — Observation Stay
Admission: EM | Admit: 2022-06-23 | Discharge: 2022-06-26 | Disposition: A | Discharge status: Home / Self Care | Payer: 59 | Attending: Internal Medicine | Admitting: Internal Medicine

## 2022-06-23 ENCOUNTER — Emergency Department: Payer: 59

## 2022-06-23 DIAGNOSIS — R531 Weakness: Secondary | ICD-10-CM

## 2022-06-23 DIAGNOSIS — Z9181 History of falling: Secondary | ICD-10-CM | POA: Insufficient documentation

## 2022-06-23 DIAGNOSIS — N1831 Chronic kidney disease, stage 3a: Secondary | ICD-10-CM | POA: Diagnosis present

## 2022-06-23 DIAGNOSIS — Z7982 Long term (current) use of aspirin: Secondary | ICD-10-CM | POA: Insufficient documentation

## 2022-06-23 DIAGNOSIS — I10 Essential (primary) hypertension: Secondary | ICD-10-CM | POA: Diagnosis present

## 2022-06-23 DIAGNOSIS — I129 Hypertensive chronic kidney disease with stage 1 through stage 4 chronic kidney disease, or unspecified chronic kidney disease: Secondary | ICD-10-CM | POA: Diagnosis not present

## 2022-06-23 DIAGNOSIS — M6281 Muscle weakness (generalized): Secondary | ICD-10-CM | POA: Insufficient documentation

## 2022-06-23 DIAGNOSIS — U071 COVID-19: Secondary | ICD-10-CM | POA: Diagnosis present

## 2022-06-23 DIAGNOSIS — G9340 Encephalopathy, unspecified: Principal | ICD-10-CM | POA: Diagnosis present

## 2022-06-23 DIAGNOSIS — I2510 Atherosclerotic heart disease of native coronary artery without angina pectoris: Secondary | ICD-10-CM | POA: Diagnosis present

## 2022-06-23 DIAGNOSIS — Z7901 Long term (current) use of anticoagulants: Secondary | ICD-10-CM | POA: Insufficient documentation

## 2022-06-23 DIAGNOSIS — Z87891 Personal history of nicotine dependence: Secondary | ICD-10-CM | POA: Insufficient documentation

## 2022-06-23 DIAGNOSIS — Z79899 Other long term (current) drug therapy: Secondary | ICD-10-CM | POA: Insufficient documentation

## 2022-06-23 DIAGNOSIS — R2681 Unsteadiness on feet: Secondary | ICD-10-CM | POA: Diagnosis not present

## 2022-06-23 DIAGNOSIS — R410 Disorientation, unspecified: Secondary | ICD-10-CM | POA: Diagnosis not present

## 2022-06-23 DIAGNOSIS — E1122 Type 2 diabetes mellitus with diabetic chronic kidney disease: Secondary | ICD-10-CM | POA: Insufficient documentation

## 2022-06-23 DIAGNOSIS — E119 Type 2 diabetes mellitus without complications: Secondary | ICD-10-CM

## 2022-06-23 LAB — TSH: TSH: 2.372 u[IU]/mL (ref 0.350–4.500)

## 2022-06-23 LAB — URINALYSIS, ROUTINE W REFLEX MICROSCOPIC
Bacteria, UA: NONE SEEN
Bilirubin Urine: NEGATIVE
Glucose, UA: NEGATIVE mg/dL
Ketones, ur: NEGATIVE mg/dL
Leukocytes,Ua: NEGATIVE
Nitrite: NEGATIVE
Protein, ur: 30 mg/dL — AB
Specific Gravity, Urine: 1.015 (ref 1.005–1.030)
Squamous Epithelial / HPF: NONE SEEN /HPF (ref 0–5)
pH: 5 (ref 5.0–8.0)

## 2022-06-23 LAB — CBC
HCT: 33.1 % — ABNORMAL LOW (ref 39.0–52.0)
Hemoglobin: 10.4 g/dL — ABNORMAL LOW (ref 13.0–17.0)
MCH: 31.7 pg (ref 26.0–34.0)
MCHC: 31.4 g/dL (ref 30.0–36.0)
MCV: 100.9 fL — ABNORMAL HIGH (ref 80.0–100.0)
Platelets: 144 10*3/uL — ABNORMAL LOW (ref 150–400)
RBC: 3.28 MIL/uL — ABNORMAL LOW (ref 4.22–5.81)
RDW: 12.3 % (ref 11.5–15.5)
WBC: 6.2 10*3/uL (ref 4.0–10.5)
nRBC: 0 % (ref 0.0–0.2)

## 2022-06-23 LAB — BASIC METABOLIC PANEL
Anion gap: 8 (ref 5–15)
BUN: 29 mg/dL — ABNORMAL HIGH (ref 8–23)
CO2: 25 mmol/L (ref 22–32)
Calcium: 8.8 mg/dL — ABNORMAL LOW (ref 8.9–10.3)
Chloride: 108 mmol/L (ref 98–111)
Creatinine, Ser: 1.47 mg/dL — ABNORMAL HIGH (ref 0.61–1.24)
GFR, Estimated: 47 mL/min — ABNORMAL LOW (ref >60)
Glucose, Bld: 86 mg/dL (ref 70–99)
Potassium: 3.5 mmol/L (ref 3.5–5.1)
Sodium: 141 mmol/L (ref 135–145)

## 2022-06-23 LAB — RESP PANEL BY RT-PCR (FLU A&B, COVID) ARPGX2
Influenza A by PCR: NEGATIVE
Influenza B by PCR: NEGATIVE
SARS Coronavirus 2 by RT PCR: POSITIVE — AB

## 2022-06-23 LAB — TROPONIN I (HIGH SENSITIVITY)
Troponin I (High Sensitivity): 13 ng/L (ref <18)
Troponin I (High Sensitivity): 14 ng/L (ref <18)

## 2022-06-23 MED ORDER — SODIUM CHLORIDE 0.9 % IV BOLUS
1000.0000 mL | Freq: Once | INTRAVENOUS | Status: AC
  Administered 2022-06-23: 1000 mL via INTRAVENOUS

## 2022-06-23 NOTE — ED Provider Notes (Signed)
[facility] Provider Note    Event Date/Time   First MD Initiated Contact with Patient 06/23/22 1956     (approximate)   History   Weakness  EM caveat: Patient with slight confusion making him a slightly poor historian  HPI  Thomas Sexton is a 82 y.o. male who on review of notes from July by cardiology has a history of previous coronary disease STEMI, carotid stenosis and carotid stenting, diabetes hypertension hyperlipidemia  Patient reports that he has had about 1 to 2 days of weakness.  He reports he just feels tired all over.  Occasionally which is not new, he reports he has to strain to urinate.  No pain no burning.  No abdominal pain no nausea or vomiting.  He reports he did not eat much today because he just felt tired but has been up walking around.  No headache no chest pain no trouble breathing  Patient reports he just feels very tired all over.  No weakness in any 1 arm or leg no slurred speech no vision changes.     Physical Exam   Triage Vital Signs: ED Triage Vitals  Enc Vitals Group     BP 06/23/22 1922 (!) 158/86     Pulse Rate 06/23/22 1922 83     Resp 06/23/22 1922 17     Temp 06/23/22 1922 98.7 F (37.1 C)     Temp Source 06/23/22 1922 Oral     SpO2 06/23/22 1919 99 %     Weight 06/23/22 1925 132 lb 0.9 oz (59.9 kg)     Height 06/23/22 1925 5' 8"$  (1.727 m)     Head Circumference --      Peak Flow --      Pain Score 06/23/22 1925 0     Pain Loc --      Pain Edu? --      Excl. in [place]? --     Most recent vital signs: Vitals:   06/23/22 2330 06/23/22 2338  BP: (!) 151/69   Pulse: 65   Resp: (!) 22   Temp:  98.9 F (37.2 C)  SpO2: 98%     General: Awake, no distress.  Patient very pleasant in no distress.  CV:  Good peripheral perfusion.  Normal tone and rate Resp:  Normal effort.  Clear bilateral Abd:  No distention.  Soft nontender nondistended.  No palpable masses or evidence of any bladder  distention Other:  Follows commands.  Speech is clear.  Moves all of his extremities with equal strength.  No pronator drift.  Normal sensation.  Cranial nerves intact normal extraocular movements.  Patient reports the year to be may be 2027, and then it is June.    ED Results / Procedures / Treatments   Labs (all labs ordered are listed, but only abnormal results are displayed) Labs Reviewed  RESP PANEL BY RT-PCR (FLU A&B, COVID) ARPGX2 - Abnormal; Notable for the following components:      Result Value   SARS Coronavirus 2 by RT PCR POSITIVE (*)    All other components within normal limits  BASIC METABOLIC PANEL - Abnormal; Notable for the following components:   BUN 29 (*)    Creatinine, Ser 1.47 (*)    Calcium 8.8 (*)    GFR, Estimated 47 (*)    All other components within normal limits  CBC - Abnormal; Notable for the following components:   RBC 3.28 (*)    Hemoglobin 10.4 (*)  HCT 33.1 (*)    MCV 100.9 (*)    Platelets 144 (*)    All other components within normal limits  URINALYSIS, ROUTINE W REFLEX MICROSCOPIC - Abnormal; Notable for the following components:   Color, Urine STRAW (*)    APPearance CLEAR (*)    Hgb urine dipstick MODERATE (*)    Protein, ur 30 (*)    All other components within normal limits  TSH  BASIC METABOLIC PANEL  TROPONIN I (HIGH SENSITIVITY)  TROPONIN I (HIGH SENSITIVITY)     EKG  Interpreted by me at 1926 heart rate 80 QRS 90 QTc 440 Normal sinus rhythm no evidence of acute ischemia.   RADIOLOGY  CT Head Wo Contrast  Result Date: 06/23/2022 CLINICAL DATA:  Facial paralysis/weakness. Generalized weakness. Confusion. EXAM: CT HEAD WITHOUT CONTRAST TECHNIQUE: Contiguous axial images were obtained from the base of the skull through the vertex without intravenous contrast. RADIATION DOSE REDUCTION: This exam was performed according to the departmental dose-optimization program which includes automated exposure control, adjustment of the  mA and/or kV according to patient size and/or use of iterative reconstruction technique. COMPARISON:  None Available. FINDINGS: Brain: No evidence of acute infarction, hemorrhage, hydrocephalus, extra-axial collection or mass lesion/mass effect. There is ventricular sulcal enlargement consistent with mild to moderate diffuse atrophy. Patchy white matter hypoattenuation is also noted bilaterally consistent with moderate chronic microvascular ischemic change. Vascular: No hyperdense vessel or unexpected calcification. Skull: Normal. Negative for fracture or focal lesion. Sinuses/Orbits: Globes and orbits are unremarkable. The visualized sinuses are clear. Other: None. IMPRESSION: No acute intracranial abnormalities. Electronically Signed   By: Lajean Manes M.D.   On: 06/23/2022 21:20        PROCEDURES:  Critical Care performed: No  Procedures   MEDICATIONS ORDERED IN ED: Medications  enoxaparin (LOVENOX) injection 40 mg (has no administration in time range)  acetaminophen (TYLENOL) tablet 650 mg (has no administration in time range)  ondansetron (ZOFRAN) tablet 4 mg (has no administration in time range)    Or  ondansetron (ZOFRAN) injection 4 mg (has no administration in time range)  polyethylene glycol (MIRALAX / GLYCOLAX) packet 17 g (has no administration in time range)  sodium chloride 0.9 % bolus 1,000 mL (0 mLs Intravenous Stopped 06/23/22 2253)     IMPRESSION / MDM / ASSESSMENT AND PLAN / ED COURSE  I reviewed the triage vital signs and the nursing notes.                              Differential diagnosis includes, but is not limited to, possible electrolyte abnormality, dehydration, urinary retention urinary tract infection infection etc., differential diagnosis quite broad.  No obvious focal neurologic symptoms.  Given the slight confusion and unclear but suspect patient has history of possible dementia after talking to one of his sisters will obtain CT of the head to evaluate  for gross abnormality evidence of stroke etc. though I think this is unlikely based on his examination and history.  EMS reports patient was orthostatic with them.  Provide hydration here, he is essentially asymptomatic other than just generalized fatigue and weakness at this point.  Patient's presentation is most consistent with acute complicated illness / injury requiring diagnostic workup.   The patient is on the cardiac monitor to evaluate for evidence of arrhythmia and/or significant heart rate changes.    ----------------------------------------- 8:14 PM on 06/23/2022 ----------------------------------------- I was able to contact his Sister Enid Derry,  she advised that typically her Sister Hoyle Sauer assist the patient with most of his medical care nurses history well.  However she does report that she believes her brother Jaqueline does have a history of dementia or some suspected dementia or ongoing confusion.  She is going to reach out to [place] whom I also attempted to call but was unable to reach and have her call so I can get additional history   Patient tested positive for COVID.  Based upon this as well as his confusion, occasional faking type activities suspect he may have some underlying mild associated delirium likely related to viral infection and COVID-19.  Given the alteration in mental status positive COVID-19 test I have discussed his case with the hospitalist so he may be further evaluated in observed due to suspected delirium in the setting of active infection.  Does not appear appropriate for discharge at this time but would benefit from hospitalization  FINAL CLINICAL IMPRESSION(S) / ED DIAGNOSES   Final diagnoses:  Weakness  Delirium  COVID-19     Rx / DC Orders   ED Discharge Orders     None        Note:  This document was prepared using Dragon voice recognition software and may include unintentional dictation errors.   Delman Kitten, MD 06/24/22 [phone]

## 2022-06-23 NOTE — H&P (Signed)
History and Physical    Patient: Thomas Sexton [id number] DOB: 03-Nov-1940 DOA: 06/23/2022 DOS: the patient was seen and examined on 06/24/2022 PCP: [name], MD  Patient coming from: Home  Chief Complaint:  Chief Complaint  Patient presents with   Weakness   HPI: Thomas Sexton is a 82 y.o. male with medical history significant of CAD s/p DES (2014), carotid artery stenosis s/p stent (2021), type 2 diabetes, hyperlipidemia, hypertension, who presents to the ED with complaints of generalized weakness.  [name] states that for the last 2 to 3 days, he has been experiencing generalized weakness and due to this, has had several falls at home where he feels like his legs just give out.  He denies hitting his head or any loss of consciousness.  He denies any other symptoms at this time including fever, chills, nausea, vomiting, diarrhea, abdominal pain, shortness of breath, cough, chest pain, or palpitations.  He denies any focal weakness.  ED Course:  On arrival to the ED, patient was hypertensive at 155/80 with heart rate of 91.  He was saturating at 96% on room air.  Initial workup remarkable for creatinine of 1.47, BUN 29, GFR of 47, WBC of 6.2, hemoglobin 10.4 and platelets of 144.  COVID-19 PCR positive.  CT of the head was obtained that did not show any acute intracranial abnormalities.  Due to acute encephalopathy suspected to be secondary to COVID-19, [facility] contacted for admission.  Review of Systems: As mentioned in the history of present illness. All other systems reviewed and are negative.  Past Medical History:  Diagnosis Date   CAD (coronary artery disease)    a. 09/2012 Acute STEMI/Cath/PCI: LM nl, LAD 21m 30d, D1 50, LCX mild prox plaque, OM1 100 (2.5x16 Promus Premier DES), RCA dom, 358mPDA 30p;  b. 09/2012 Echo: EF 55-60%, PASP 3268m.   Diabetes mellitus without complication (HCC)    HTN (hypertension)    Hyperlipidemia    Tobacco abuse    Past  Surgical History:  Procedure Laterality Date   CARDIAC CATHETERIZATION     CAROTID PTA/STENT INTERVENTION Right 09/02/2019   Procedure: CAROTID PTA/STENT INTERVENTION;  Surgeon: DewAlgernon HuxleyD;  [facility];  Service: Cardiovascular;  Laterality: Right;   CAROTID PTA/STENT INTERVENTION Left 06/20/2020   Procedure: CAROTID PTA/STENT INTERVENTION;  Surgeon: DewAlgernon HuxleyD;  Location: [facility];  Service: Cardiovascular;  Laterality: Left;   CORONARY ANGIOPLASTY  09/2012   a. 09/2012 Acute STEMI/Cath/PCI: LM nl, LAD 32m38md, D1 50, LCX mild prox plaque, OM1 100 (2.5x16 Promus Premier DES), RCA dom, 27m,38m 30p;  b. 09/2012 Echo: EF 55-60%, PASP 32mmH16m LEFT HEART CATH  09/30/2012   Procedure: LEFT HEART CATH;  Surgeon: ChristBurnell Blanks [facility];  Service: Cardiovascular;;   LEFT HEART CATHETERIZATION WITH CORONARY ANGIOGRAM N/A 09/30/2012   Procedure: STEMI;  Surgeon: ChristBurnell Blanks [facility];  Service: Cardiovascular;  Laterality: N/A;   PERCUTANEOUS CORONARY STENT INTERVENTION (PCI-S)  09/30/2012   Procedure: PERCUTANEOUS CORONARY STENT INTERVENTION (PCI-S);  Surgeon: ChristBurnell Blanks [facility];  Service: Cardiovascular;;   Social History:  reports that he quit smoking about 9 years ago. His smoking use included cigarettes. He has a 25.00 pack-year smoking history. He has never used smokeless tobacco. He reports that he does not drink alcohol and does not use drugs.  No Known Allergies  Family History  Problem Relation Age of Onset   Heart attack Mother    Hypertension Father    Hyperlipidemia Father     Prior to Admission medications   Medication Sig Start Date End Date Taking? Authorizing Provider  aspirin EC 81 MG EC tablet Take 1 tablet (81 mg total) by mouth daily. 10/03/12  Yes Theora Gianotti, NP  atorvastatin (LIPITOR) 80 MG tablet Take 1 tablet (80 mg total) by mouth  daily. 12/05/21  Yes Gollan, Kathlene November, MD  cetirizine (ZYRTEC) 10 MG tablet Take 1 tablet by mouth daily. 07/31/21 07/31/22 Yes [provider]  cholecalciferol (VITAMIN D) 1000 UNITS tablet Take 1,000 Units by mouth daily.   Yes [provider]  clopidogrel (PLAVIX) 75 MG tablet TAKE 1 TABLET BY MOUTH EVERY DAY 06/13/21  Yes Kris Hartmann, NP  donepezil (ARICEPT) 10 MG tablet Take 10 mg by mouth daily.   Yes [provider]  ferrous sulfate 325 (65 FE) MG tablet Take 1 tablet by mouth daily. 01/19/22 01/19/23 Yes [provider]  fluticasone (FLONASE) 50 MCG/ACT nasal spray 1 spray into each nostril daily. 01/16/22 01/16/23 Yes [provider]  ipratropium (ATROVENT) 0.03 % nasal spray Place 2 sprays into both nostrils every 12 (twelve) hours. 04/23/22  Yes Raspet, Erin K, PA-C  lisinopril (ZESTRIL) 2.5 MG tablet Take 1 tablet by mouth daily. 01/19/22 01/19/23 Yes [provider]  metoprolol tartrate (LOPRESSOR) 25 MG tablet Take 1 tablet (25 mg total) by mouth 2 (two) times daily. 12/05/21  Yes Gollan, Kathlene November, MD  vitamin B-12 (CYANOCOBALAMIN) 1000 MCG tablet Take 1,000 mcg by mouth daily.   Yes [provider]  nitroGLYCERIN (NITROSTAT) 0.4 MG SL tablet Place 1 tablet (0.4 mg total) under the tongue every 5 (five) minutes x 3 doses as needed for chest pain. 10/03/12   Theora Gianotti, NP    Physical Exam: Vitals:   06/23/22 2300 06/23/22 2330 06/23/22 2338 06/24/22 0030  BP: (!) 152/67 (!) 151/69  (!) 107/94  Pulse: 76 65  82  Resp: 15 (!) 22  20  Temp:   98.9 F (37.2 C)   TempSrc:   Oral   SpO2: 99% 98%  98%  Weight:      Height:       Physical Exam Vitals and nursing note reviewed.  Constitutional:      General: He is not in acute distress.    Appearance: He is normal weight. He is not toxic-appearing.  HENT:     Head: Normocephalic and atraumatic.     Mouth/Throat:     Mouth: Mucous membranes are dry.     Pharynx:  Oropharynx is clear.  Eyes:     Extraocular Movements: Extraocular movements intact.     Conjunctiva/sclera: Conjunctivae normal.     Pupils: Pupils are equal, round, and reactive to light.  Cardiovascular:     Rate and Rhythm: Normal rate and regular rhythm.     Heart sounds: No murmur heard.    No gallop.  Pulmonary:     Effort: Pulmonary effort is normal. No respiratory distress.     Breath sounds: Normal breath sounds. No wheezing or rales.  Abdominal:     General: Bowel sounds are normal. There is no distension.     Palpations: Abdomen is soft.     Tenderness: There is no abdominal tenderness. There is no guarding.  Musculoskeletal:     Right lower leg: No edema.  Left lower leg: No edema.  Skin:    General: Skin is warm and dry.  Neurological:     Mental Status: He is alert.     Comments:  Patient alert and oriented to person, place, situation.  Stated that the year was 1997, but that the president was [place]. No facial asymmetry or dysarthria Sensation intact throughout 5 out of 5 strength throughout Normal finger-to-nose.  Psychiatric:        Mood and Affect: Mood normal.        Behavior: Behavior normal.    Data Reviewed: CBC with WBC of 6.2, hemoglobin of 10.4, MCV of 100.9 and platelets of 144 BMP with sodium of 141, potassium 3.5, bicarb 25, BUN 29, Gran 1.47 and GFR 47 Troponin negative x 2 at 13 and 14 TSH within normal limits at 2.3 COVID-19 PCR positive.  Influenza PCR negative. Urinalysis with moderate hematuria and proteinuria, but no WBC, nitrites, leukocytes or bacteria seen  EKG personally reviewed.  Sinus rhythm with a rate of 82.  No acute ST or T wave changes concerning for acute ischemia.  CT Head Wo Contrast  Result Date: 06/23/2022 CLINICAL DATA:  Facial paralysis/weakness. Generalized weakness. Confusion. EXAM: CT HEAD WITHOUT CONTRAST TECHNIQUE: Contiguous axial images were obtained from the base of the skull through the vertex without  intravenous contrast. RADIATION DOSE REDUCTION: This exam was performed according to the departmental dose-optimization program which includes automated exposure control, adjustment of the mA and/or kV according to patient size and/or use of iterative reconstruction technique. COMPARISON:  None Available. FINDINGS: Brain: No evidence of acute infarction, hemorrhage, hydrocephalus, extra-axial collection or mass lesion/mass effect. There is ventricular sulcal enlargement consistent with mild to moderate diffuse atrophy. Patchy white matter hypoattenuation is also noted bilaterally consistent with moderate chronic microvascular ischemic change. Vascular: No hyperdense vessel or unexpected calcification. Skull: Normal. Negative for fracture or focal lesion. Sinuses/Orbits: Globes and orbits are unremarkable. The visualized sinuses are clear. Other: None. IMPRESSION: No acute intracranial abnormalities. Electronically Signed   By: Lajean Manes M.D.   On: 06/23/2022 21:20    There are no new results to review at this time.  Assessment and Plan:  * Acute encephalopathy Patient presented with generalized weakness and found to be disoriented on EDP evaluation.  On personal evaluation, patient is disoriented to time only.  Most consistent with delirium in the setting of underlying infection possible dehydration.  No focal findings to suggest acute CVA.  - Management of COVID-19 as noted below - PT/OT - IV fluid resuscitation - Continue home Aricept  COVID-19 virus infection Patient presenting with 2 to 3-day history of generalized weakness and found to have COVID-19.  No respiratory or abdominal symptoms at this time.  Given advanced age, will start Paxlovid.  - Paxlovid, renal dosing - S/p 1 L bolus  Stage 3a chronic kidney disease ([place]) Renal function currently at baseline.  Will continue to monitor while admitted.  Type 2 diabetes mellitus ([place]) Per chart review, patient has a history of diabetes,  but is not currently on any antiglycemic agents and A1c was within normal limits in September 2023.  No indication for SSI at this time  Coronary atherosclerosis of native coronary artery - Continue home aspirin, Plavix and atorvastatin  Hypertension - Continue home antihypertensives  Advance Care Planning:   Code Status: Full Code   Consults: None  Family Communication: No family at bedside.  Severity of Illness: The appropriate patient status for this patient is OBSERVATION. Observation  status is judged to be reasonable and necessary in order to provide the required intensity of service to ensure the patient's safety. The patient's presenting symptoms, physical exam findings, and initial radiographic and laboratory data in the context of their medical condition is felt to place them at decreased risk for further clinical deterioration. Furthermore, it is anticipated that the patient will be medically stable for discharge from the hospital within 2 midnights of admission.   Author: Jose Persia, MD 06/24/2022 12:44 AM  For on call review [email or web address].

## 2022-06-23 NOTE — ED Notes (Signed)
Bladder Scan shows 69 ml post void residual

## 2022-06-23 NOTE — ED Triage Notes (Signed)
Patient arrived EMS from home. Patient states he has been weak for the pass three days. States he has had a couple of falls today. EMS states they had positive orthostatic changes in the heart rate. Patient is not complaining on any pain at this time.

## 2022-06-23 NOTE — ED Notes (Signed)
Patient reminded that urine is needed.  Urinal had been given previously by another nurse.

## 2022-06-24 ENCOUNTER — Encounter (INDEPENDENT_AMBULATORY_CARE_PROVIDER_SITE_OTHER): Payer: Self-pay | Admitting: Nurse Practitioner

## 2022-06-24 ENCOUNTER — Observation Stay: Payer: 59

## 2022-06-24 DIAGNOSIS — U071 COVID-19: Secondary | ICD-10-CM | POA: Diagnosis not present

## 2022-06-24 DIAGNOSIS — G9340 Encephalopathy, unspecified: Secondary | ICD-10-CM

## 2022-06-24 LAB — BASIC METABOLIC PANEL
Anion gap: 11 (ref 5–15)
BUN: 24 mg/dL — ABNORMAL HIGH (ref 8–23)
CO2: 21 mmol/L — ABNORMAL LOW (ref 22–32)
Calcium: 8.7 mg/dL — ABNORMAL LOW (ref 8.9–10.3)
Chloride: 107 mmol/L (ref 98–111)
Creatinine, Ser: 1.35 mg/dL — ABNORMAL HIGH (ref 0.61–1.24)
GFR, Estimated: 52 mL/min — ABNORMAL LOW (ref >60)
Glucose, Bld: 98 mg/dL (ref 70–99)
Potassium: 3.9 mmol/L (ref 3.5–5.1)
Sodium: 139 mmol/L (ref 135–145)

## 2022-06-24 MED ORDER — NIRMATRELVIR/RITONAVIR (PAXLOVID) TABLET (RENAL DOSING)
2.0000 | ORAL_TABLET | Freq: Two times a day (BID) | ORAL | Status: DC
  Administered 2022-06-24 – 2022-06-26 (×4): 2 via ORAL
  Filled 2022-06-24: qty 20

## 2022-06-24 MED ORDER — ASPIRIN 81 MG PO TBEC
81.0000 mg | DELAYED_RELEASE_TABLET | Freq: Every day | ORAL | Status: DC
  Administered 2022-06-24 – 2022-06-26 (×3): 81 mg via ORAL
  Filled 2022-06-24 (×3): qty 1

## 2022-06-24 MED ORDER — ACETAMINOPHEN 325 MG PO TABS: 650.0000 mg | ORAL_TABLET | Freq: Four times a day (QID) | ORAL | Status: DC | PRN

## 2022-06-24 MED ORDER — POLYETHYLENE GLYCOL 3350 17 G PO PACK: 17.0000 g | PACK | Freq: Every day | ORAL | Status: DC | PRN

## 2022-06-24 MED ORDER — ATORVASTATIN CALCIUM 20 MG PO TABS: 80.0000 mg | ORAL_TABLET | Freq: Every day | ORAL | Status: DC

## 2022-06-24 MED ORDER — FLUTICASONE PROPIONATE 50 MCG/ACT NA SUSP
1.0000 | Freq: Every day | NASAL | Status: DC
  Filled 2022-06-24: qty 16

## 2022-06-24 MED ORDER — NIRMATRELVIR/RITONAVIR (PAXLOVID) TABLET (RENAL DOSING): 2.0000 | ORAL_TABLET | Freq: Two times a day (BID) | ORAL | Status: DC

## 2022-06-24 MED ORDER — CLOPIDOGREL BISULFATE 75 MG PO TABS
75.0000 mg | ORAL_TABLET | Freq: Every day | ORAL | Status: DC
  Administered 2022-06-24 – 2022-06-26 (×3): 75 mg via ORAL
  Filled 2022-06-24 (×3): qty 1

## 2022-06-24 MED ORDER — DONEPEZIL HCL 5 MG PO TABS
10.0000 mg | ORAL_TABLET | Freq: Every day | ORAL | Status: DC
  Administered 2022-06-24 – 2022-06-26 (×3): 10 mg via ORAL
  Filled 2022-06-24 (×3): qty 2

## 2022-06-24 MED ORDER — ONDANSETRON HCL 4 MG PO TABS: 4.0000 mg | ORAL_TABLET | Freq: Four times a day (QID) | ORAL | Status: DC | PRN

## 2022-06-24 MED ORDER — METOPROLOL TARTRATE 25 MG PO TABS
25.0000 mg | ORAL_TABLET | Freq: Two times a day (BID) | ORAL | Status: DC
  Administered 2022-06-24 – 2022-06-26 (×5): 25 mg via ORAL
  Filled 2022-06-24 (×5): qty 1

## 2022-06-24 MED ORDER — FERROUS SULFATE 325 (65 FE) MG PO TABS
325.0000 mg | ORAL_TABLET | Freq: Every day | ORAL | Status: DC
  Administered 2022-06-24 – 2022-06-26 (×3): 325 mg via ORAL
  Filled 2022-06-24 (×3): qty 1

## 2022-06-24 MED ORDER — LISINOPRIL 5 MG PO TABS
2.5000 mg | ORAL_TABLET | Freq: Every day | ORAL | Status: DC
  Administered 2022-06-24: 2.5 mg via ORAL
  Filled 2022-06-24: qty 1

## 2022-06-24 MED ORDER — ENOXAPARIN SODIUM 40 MG/0.4ML IJ SOSY
40.0000 mg | PREFILLED_SYRINGE | INTRAMUSCULAR | Status: DC
  Administered 2022-06-24 – 2022-06-26 (×3): 40 mg via SUBCUTANEOUS
  Filled 2022-06-24 (×3): qty 0.4

## 2022-06-24 MED ORDER — NIRMATRELVIR/RITONAVIR (PAXLOVID) TABLET (RENAL DOSING)
2.0000 | ORAL_TABLET | Freq: Once | ORAL | Status: AC
  Administered 2022-06-24: 2 via ORAL

## 2022-06-24 MED ORDER — ONDANSETRON HCL 4 MG/2ML IJ SOLN: 4.0000 mg | Freq: Four times a day (QID) | INTRAMUSCULAR | Status: DC | PRN

## 2022-06-24 NOTE — Assessment & Plan Note (Signed)
Per chart review, patient has a history of diabetes, but is not currently on any antiglycemic agents and A1c was within normal limits in September 2023.  No indication for SSI at this time

## 2022-06-24 NOTE — Progress Notes (Signed)
Progress Note   Patient: Thomas Sexton [id number] DOB: 1941/02/11 DOA: 06/23/2022     0 DOS: the patient was seen and examined on 06/24/2022   Brief hospital course: Taken from H&P.  Thomas Sexton is a 82 y.o. male with medical history significant of CAD s/p DES (2014), carotid artery stenosis s/p stent (2021), type 2 diabetes, hyperlipidemia, hypertension, who presents to the ED with complaints of generalized weakness for the past 2 to 3 days,due to this, has had several falls at home where he feels like his legs just give out. He denies hitting his head or any loss of consciousness. He denies any other symptoms at this time including fever, chills, nausea, vomiting, diarrhea, abdominal pain, shortness of breath, cough, chest pain, or palpitations. He denies any focal weakness.   ED Course:  On arrival to the ED, patient was hypertensive at 155/80 with heart rate of 91.  He was saturating at 96% on room air.  Initial workup remarkable for creatinine of 1.47, BUN 29, GFR of 47, WBC of 6.2, hemoglobin 10.4 and platelets of 144.  COVID-19 PCR positive.  CT of the head was obtained that did not show any acute intracranial abnormalities.  Patient was started on Paxlovid.  2/11: Vital stable, afebrile.  PT and OT were recommending SNF.  Due to their concern of leaning towards right, brain MRI was ordered.  CT head was without any acute abnormality. Patient has to complete 10 days of quarantine before proceeding to SNF.    Assessment and Plan: * Acute encephalopathy Improved. Patient presented with generalized weakness and found to be disoriented on EDP evaluation.  Most consistent with delirium in the setting of underlying infection possible dehydration.  No focal findings to suggest acute CVA.  - Management of COVID-19 as noted below - PT/OT-recommending SNF - IV fluid resuscitation - Continue home Aricept  COVID-19 virus infection Patient presenting with 2 to 3-day history of  generalized weakness and found to have COVID-19.  No respiratory or abdominal symptoms at this time.  Given advanced age, will start Paxlovid.  - Paxlovid, renal dosing - S/p 1 L bolus  Stage 3a chronic kidney disease ([place]) Renal function currently at baseline.  Will continue to monitor while admitted.  Type 2 diabetes mellitus ([place]) Per chart review, patient has a history of diabetes, but is not currently on any antiglycemic agents and A1c was within normal limits in September 2023.  No indication for SSI at this time  Coronary atherosclerosis of native coronary artery - Continue home aspirin, Plavix and atorvastatin  Hypertension - Continue home antihypertensives   Subjective: Patient was seen and examined today.  Continues to feel weak and appetite remained poor.  Physical Exam: Vitals:   06/24/22 1332 06/24/22 1333 06/24/22 1400 06/24/22 1500  BP: (!) 144/81  (!) 143/74 (!) 152/82  Pulse:  60 (!) 58 60  Resp:  16 15 14  $ Temp:      TempSrc:      SpO2:  99% 99% 98%  Weight:      Height:       General.  Frail elderly man, in no acute distress. Pulmonary.  Lungs clear bilaterally, normal respiratory effort. CV.  Regular rate and rhythm, no JVD, rub or murmur. Abdomen.  Soft, nontender, nondistended, BS positive. CNS.  Alert and oriented x 2.  No focal neurologic deficit. Extremities.  No edema, no cyanosis, pulses intact and symmetrical. Psychiatry.  Appears to have some cognitive impairment  Data Reviewed:  Prior data reviewed  Family Communication: Tried calling 2 sisters listed in his chart with no success.  Disposition: Status is: Observation The patient remains OBS appropriate and will d/c before 2 midnights.  Planned Discharge Destination: Skilled nursing facility  DVT prophylaxis.  Lovenox Time spent: 45 minutes  This record has been created using Systems analyst. Errors have been sought and corrected,but may not always be located. Such  creation errors do not reflect on the standard of care.   Author: Lorella Nimrod, MD 06/24/2022 3:37 PM  For on call review [email or web address].

## 2022-06-24 NOTE — Assessment & Plan Note (Addendum)
Improved. Patient presented with generalized weakness and found to be disoriented on EDP evaluation.  Most consistent with delirium in the setting of underlying infection possible dehydration.  No focal findings to suggest acute CVA.  - Management of COVID-19 as noted below - PT/OT-recommending SNF - IV fluid resuscitation - Continue home Aricept

## 2022-06-24 NOTE — Assessment & Plan Note (Signed)
Renal function currently at baseline.  Will continue to monitor while admitted.

## 2022-06-24 NOTE — Evaluation (Signed)
Physical Therapy Evaluation Patient Details Name: Thomas Sexton MRN: [id number] DOB: 1940-09-13 Today's Date: 06/24/2022  History of Present Illness  Patient is a 82 year old male presenting with complaints of generalized weakness with falls at home. Acute encephalopathy and COVID-19 positive. History of coronary disease STEMI, carotid stenosis and carotid stenting, diabetes hypertension hyperlipidemia  Clinical Impression  Patient is agreeable to PT evaluation. He is disoriented to place (thinks he is at [facility]), situation, and time. He reports living with his sister and brother-in-law and usually ambulating with a cane.  Today, the patient has a persistent right side lean with sitting that required assistance to maintain midline. He has intermittent drooling with sitting up initially as well. The right leg is mildly weaker than the left leg with manual muscle testing. The patient required assistance for transfers and to maintain standing balance. Ambulation performed in the room with excessive knee and hip flexion with decreased step length on the right leg. Activity tolerance limited by fatigue. Patient overestimates his physical abilities. Recommend PT follow up to maximize independence. Anticipate patient will nee SNF placement at this time depending on progress with functional independence.      Recommendations for follow up therapy are one component of a multi-disciplinary discharge planning process, led by the attending physician.  Recommendations may be updated based on patient status, additional functional criteria and insurance authorization.  Follow Up Recommendations Skilled nursing-short term rehab (<3 hours/day) Can patient physically be transported by private vehicle: No    Assistance Recommended at Discharge Frequent or constant Supervision/Assistance  Patient can return home with the following  A lot of help with walking and/or transfers;A lot of help with  bathing/dressing/bathroom;Help with stairs or ramp for entrance;Assist for transportation;Assistance with cooking/housework    Equipment Recommendations None recommended by PT  Recommendations for Other Services       Functional Status Assessment Patient has had a recent decline in their functional status and demonstrates the ability to make significant improvements in function in a reasonable and predictable amount of time.     Precautions / Restrictions Precautions Precautions: Fall Restrictions Weight Bearing Restrictions: No      Mobility  Bed Mobility Overal bed mobility: Needs Assistance Bed Mobility: Supine to Sit, Sit to Supine     Supine to sit: Mod assist, HOB elevated Sit to supine: Mod assist   General bed mobility comments: assistance for trunk support to sit upright. assistance for LE support to return to bed. increased time and effort    Transfers Overall transfer level: Needs assistance Equipment used: Rolling walker (2 wheels) Transfers: Sit to/from Stand Sit to Stand: Mod assist, +2 safety/equipment           General transfer comment: second person required for safety. 2 standing bouts performed    Ambulation/Gait Ambulation/Gait assistance: Mod assist Gait Distance (Feet): 20 Feet Assistive device: Rolling walker (2 wheels) Gait Pattern/deviations: Decreased stride length, Decreased stance time - right, Trunk flexed Gait velocity: decreased     General Gait Details: excessive hip and knee flexion with ambulation with difficulty maintaining upright standing posture. patient fatigues quickly with activity but overestimates his abilities.  Stairs            Wheelchair Mobility    Modified Rankin (Stroke Patients Only)       Balance Overall balance assessment: Needs assistance Sitting-balance support: Feet supported Sitting balance-Leahy Scale: Poor Sitting balance - Comments: persistent right side lean with sitting with assistance  required to maintain midline.  decreased awareness of midline with cues required for increased awareness Postural control: Right lateral lean Standing balance support: Bilateral upper extremity supported, Reliant on assistive device for balance, During functional activity Standing balance-Leahy Scale: Poor Standing balance comment: extrenal support required                             Pertinent Vitals/Pain Pain Assessment Pain Assessment: No/denies pain    Home Living Family/patient expects to be discharged to:: Private residence Living Arrangements: Other relatives (sister and brother in law) Available Help at Discharge: Family Type of Home: House Home Access: Stairs to enter Entrance Stairs-Rails: Can reach both Entrance Stairs-Number of Steps: 4   Home Layout: One level Home Equipment: Cane - single point;Shower seat Additional Comments: information taken from chart, pt is a poor historian, will continue to assess    Prior Function Prior Level of Function : Independent/Modified Independent             Mobility Comments: patient reports using a single point cane at baseline       Hand Dominance        Extremity/Trunk Assessment   Upper Extremity Assessment Upper Extremity Assessment: Defer to OT evaluation RUE Deficits / Details: mildly increased weakness throughout RUE, 3+/5    Lower Extremity Assessment Lower Extremity Assessment: RLE deficits/detail;LLE deficits/detail RLE Deficits / Details: knee extension 4+/5, hip add 4/5, hip abd 5/5, dorsiflexion 4/5, plantarflexion 5/5 LLE Deficits / Details: grossly 5/5 throughout       Communication   Communication: No difficulties  Cognition Arousal/Alertness: Awake/alert Behavior During Therapy: Flat affect Overall Cognitive Status: Impaired/Different from baseline Area of Impairment: Orientation, Attention, Memory, Following commands, Safety/judgement, Awareness, Problem solving                  Orientation Level: Disoriented to, Place, Time, Situation (reports he is a [facility]) Current Attention Level: Sustained Memory: Decreased short-term memory Following Commands: Follows one step commands with increased time Safety/Judgement: Decreased awareness of safety, Decreased awareness of deficits   Problem Solving: Slow processing, Decreased initiation, Difficulty sequencing, Requires verbal cues, Requires tactile cues          General Comments General comments (skin integrity, edema, etc.): noted drooling with sitting upright. MD alerted of drooling and right side lean with sitting    Exercises     Assessment/Plan    PT Assessment Patient needs continued PT services  PT Problem List Decreased strength;Decreased range of motion;Decreased activity tolerance;Decreased balance;Decreased mobility;Decreased safety awareness;Decreased cognition       PT Treatment Interventions Gait training;DME instruction;Stair training;Functional mobility training;Therapeutic activities;Therapeutic exercise;Balance training;Neuromuscular re-education;Cognitive remediation;Patient/family education    PT Goals (Current goals can be found in the Care Plan section)  Acute Rehab PT Goals Patient Stated Goal: to go home PT Goal Formulation: With patient Time For Goal Achievement: 07/08/22 Potential to Achieve Goals: Fair    Frequency Min 2X/week     Co-evaluation PT/OT/SLP Co-Evaluation/Treatment: Yes Reason for Co-Treatment: Complexity of the patient's impairments (multi-system involvement) PT goals addressed during session: Mobility/safety with mobility OT goals addressed during session: ADL's and self-care       AM-PAC PT "6 Clicks" Mobility  Outcome Measure Help needed turning from your back to your side while in a flat bed without using bedrails?: A Lot Help needed moving from lying on your back to sitting on the side of a flat bed without using bedrails?: A Lot Help needed  moving  to and from a bed to a chair (including a wheelchair)?: A Lot Help needed standing up from a chair using your arms (e.g., wheelchair or bedside chair)?: A Lot Help needed to walk in hospital room?: A Lot Help needed climbing 3-5 steps with a railing? : A Lot 6 Click Score: 12    End of Session   Activity Tolerance: Patient limited by fatigue Patient left: in bed;with call bell/phone within reach Nurse Communication: Mobility status PT Visit Diagnosis: Unsteadiness on feet (R26.81)    Time: 1130-1155 PT Time Calculation (min) (ACUTE ONLY): 25 min   Charges:   PT Evaluation $PT Eval Low Complexity: 1 Low PT Treatments $Therapeutic Activity: 8-22 mins        [name], PT, MPT   [name] 06/24/2022, 1:42 PM

## 2022-06-24 NOTE — Hospital Course (Addendum)
Taken from H&P.  Thomas Sexton is a 82 y.o. male with medical history significant of CAD s/p DES (2014), carotid artery stenosis s/p stent (2021), type 2 diabetes, hyperlipidemia, hypertension, who presents to the ED with complaints of generalized weakness for the past 2 to 3 days,due to this, has had several falls at home where he feels like his legs just give out. He denies hitting his head or any loss of consciousness. He denies any other symptoms at this time including fever, chills, nausea, vomiting, diarrhea, abdominal pain, shortness of breath, cough, chest pain, or palpitations. He denies any focal weakness.   ED Course:  On arrival to the ED, patient was hypertensive at 155/80 with heart rate of 91.  He was saturating at 96% on room air.  Initial workup remarkable for creatinine of 1.47, BUN 29, GFR of 47, WBC of 6.2, hemoglobin 10.4 and platelets of 144.  COVID-19 PCR positive.  CT of the head was obtained that did not show any acute intracranial abnormalities.  Patient was started on Paxlovid.  2/11: Vital stable, afebrile.  PT and OT were recommending SNF.  Due to their concern of leaning towards right, brain MRI was ordered.  CT head was without any acute abnormality. Patient has to complete 10 days of quarantine before proceeding to SNF.  2/12: Mildly elevated blood pressure at 162/45, increasing the dose of lisinopril from 2.5--5  MRI brain was without any acute abnormality.  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 Patient wants to go home with home health which was ordered. We will continue PT for another day.  Stable from COVID standpoint.  2/13: Hemodynamically stable.  Blood pressure improving after increasing the dose of lisinopril.  Weakness improving and he was able to perform better with PT and now they are recommending home health services which  were ordered.  Patient was given couple more days of Paxlovid to complete the course.  He will continue the rest of his home medications except the changes as mentioned above and follow-up with his providers for further recommendations.

## 2022-06-24 NOTE — Assessment & Plan Note (Signed)
-   Continue home aspirin, Plavix and atorvastatin

## 2022-06-24 NOTE — Evaluation (Signed)
Occupational Therapy Evaluation Patient Details Name: Thomas Sexton MRN: [id number] DOB: 10/10/1940 Today's Date: 06/24/2022   History of Present Illness Pt is an 82 year old male admitted with acute encephalopathy, Covid-19; PMH significant for  CAD s/p DES (2014), carotid artery stenosis s/p stent (2021), type 2 diabetes, hyperlipidemia, hypertension   Clinical Impression   Chart reviewed, pt greeted in room agreeable to OT evaluation. Co tx completed by PT on this date. Pt is oriented to self only. Slow processing throughout. Poor awareness of deficits. PTA pt reports he lives with his sister but is generally MOD I with ADL. MOD A required for bed mobility with noted R lateral lean while sitting and in standing. STS with MOD A +2, amb in room MOD A +2 with excessive hip/knee flexion (slowly with an increased squat) to toilet. Toileting completed with MAX A, LB completed with Max A. Pt noted to be drooling, vitals are stable. MD notified. Pt is left in bed, all needs met. Recommend STR at this time to address functional deficits unless pt has physical assist for ADLS/ use of a mwc for mobility. OT will continue to follow acutely.      Recommendations for follow up therapy are one component of a multi-disciplinary discharge planning process, led by the attending physician.  Recommendations may be updated based on patient status, additional functional criteria and insurance authorization.   Follow Up Recommendations  Skilled nursing-short term rehab (<3 hours/day)     Assistance Recommended at Discharge Frequent or constant Supervision/Assistance  Patient can return home with the following A lot of help with walking and/or transfers;A lot of help with bathing/dressing/bathroom    Functional Status Assessment  Patient has had a recent decline in their functional status and demonstrates the ability to make significant improvements in function in a reasonable and predictable amount of time.   Equipment Recommendations  BSC/3in1    Recommendations for Other Services       Precautions / Restrictions Precautions Precautions: Fall Restrictions Weight Bearing Restrictions: No      Mobility Bed Mobility Overal bed mobility: Needs Assistance Bed Mobility: Supine to Sit, Sit to Supine     Supine to sit: Mod assist, HOB elevated Sit to supine: Mod assist, HOB elevated        Transfers Overall transfer level: Needs assistance Equipment used: Rolling walker (2 wheels) Transfers: Sit to/from Stand Sit to Stand: Mod assist, +2 physical assistance                  Balance Overall balance assessment: Needs assistance Sitting-balance support: Feet supported Sitting balance-Leahy Scale: Poor Sitting balance - Comments: consistent R lateral lean Postural control: Right lateral lean Standing balance support: Bilateral upper extremity supported, Reliant on assistive device for balance, During functional activity Standing balance-Leahy Scale: Poor                             ADL either performed or assessed with clinical judgement   ADL Overall ADL's : Needs assistance/impaired     Grooming: Wash/dry face;Sitting;Supervision/safety           Upper Body Dressing : Moderate assistance;Sitting   Lower Body Dressing: Maximal assistance;Sit to/from stand Lower Body Dressing Details (indicate cue type and reason): socks Toilet Transfer: Moderate assistance;Rolling walker (2 wheels);Ambulation;Cueing for sequencing;Cueing for safety   Toileting- Clothing Manipulation and Hygiene: Maximal assistance;Sit to/from stand       Functional mobility during ADLs:  Moderate assistance;Rolling walker (2 wheels);+2 for safety/equipment (approx 15' )       Vision Patient Visual Report: No change from baseline Additional Comments: will continue to assess     Perception     Praxis      Pertinent Vitals/Pain Pain Assessment Pain Assessment: No/denies  pain     Hand Dominance     Extremity/Trunk Assessment Upper Extremity Assessment Upper Extremity Assessment: Generalized weakness;RUE deficits/detail RUE Deficits / Details: mildly increased weakness throughout RUE, 3+/5   Lower Extremity Assessment Lower Extremity Assessment: Generalized weakness;Defer to PT evaluation       Communication Communication Communication: No difficulties   Cognition Arousal/Alertness: Awake/alert Behavior During Therapy: Flat affect Overall Cognitive Status: Impaired/Different from baseline Area of Impairment: Orientation, Attention, Memory, Following commands, Safety/judgement, Awareness, Problem solving                 Orientation Level: Disoriented to, Place, Time, Situation (states he is at [employer]) Current Attention Level: Sustained Memory: Decreased short-term memory Following Commands: Follows one step commands with increased time Safety/Judgement: Decreased awareness of safety, Decreased awareness of deficits Awareness: Intellectual Problem Solving: Slow processing, Decreased initiation, Difficulty sequencing, Requires verbal cues, Requires tactile cues       General Comments  vital signs monitored, appear stable throughout    Exercises     Shoulder Instructions      Home Living Family/patient expects to be discharged to:: Private residence Living Arrangements: Other relatives Available Help at Discharge: Family Type of Home: House Home Access: Stairs to enter Technical brewer of Steps: 4 Entrance Stairs-Rails: Can reach both Home Layout: One level     Bathroom Shower/Tub: Tub/shower unit         Home Equipment: Cane - single point;Shower seat   Additional Comments: information taken from chart, pt is a poor historian, will continue to assess      Prior Functioning/Environment               Mobility Comments: reports amb with a SPC, documented fall history due to acute illness          OT  Problem List: Decreased strength;Impaired balance (sitting and/or standing);Decreased activity tolerance;Decreased cognition;Decreased safety awareness;Decreased coordination;Impaired vision/perception;Decreased knowledge of use of DME or AE;Decreased knowledge of precautions      OT Treatment/Interventions: Self-care/ADL training;Therapeutic exercise;Energy conservation;DME and/or AE instruction;Therapeutic activities;Patient/family education;Manual therapy    OT Goals(Current goals can be found in the care plan section) Acute Rehab OT Goals Patient Stated Goal: get strong OT Goal Formulation: With patient Time For Goal Achievement: 07/08/22 Potential to Achieve Goals: Good ADL Goals Pt Will Perform Grooming: with supervision;sitting;standing Pt Will Perform Lower Body Dressing: with supervision;sit to/from stand Pt Will Transfer to Toilet: with supervision;ambulating Pt Will Perform Toileting - Clothing Manipulation and hygiene: with supervision;sit to/from stand  OT Frequency: Min 2X/week    Co-evaluation PT/OT/SLP Co-Evaluation/Treatment: Yes Reason for Co-Treatment: Complexity of the patient's impairments (multi-system involvement);Necessary to address cognition/behavior during functional activity;For patient/therapist safety;To address functional/ADL transfers   OT goals addressed during session: ADL's and self-care      AM-PAC OT "6 Clicks" Daily Activity     Outcome Measure Help from another person eating meals?: A Little Help from another person taking care of personal grooming?: A Little Help from another person toileting, which includes using toliet, bedpan, or urinal?: A Lot Help from another person bathing (including washing, rinsing, drying)?: A Lot Help from another person to put on and taking off regular upper body  clothing?: A Little Help from another person to put on and taking off regular lower body clothing?: A Lot 6 Click Score: 15   End of Session Equipment  Utilized During Treatment: Rolling walker (2 wheels) Nurse Communication: Mobility status  Activity Tolerance: Patient tolerated treatment well Patient left: in bed  OT Visit Diagnosis: Unsteadiness on feet (R26.81);History of falling (Z91.81);Muscle weakness (generalized) (M62.81)                Time: 1130-1156 OT Time Calculation (min): 26 min Charges:  OT General Charges $OT Visit: 1 Visit OT Evaluation $OT Eval Moderate Complexity: 1 Mod  Shanon Payor, OTD OTR/L  06/24/22, 1:27 PM

## 2022-06-24 NOTE — Assessment & Plan Note (Signed)
Patient presenting with 2 to 3-day history of generalized weakness and found to have COVID-19.  No respiratory or abdominal symptoms at this time.  Given advanced age, will start Paxlovid.  - Paxlovid, renal dosing - S/p 1 L bolus

## 2022-06-24 NOTE — Progress Notes (Signed)
Subjective:    Patient ID: Thomas Sexton, male    DOB: [birth year], 82 y.o.   MRN: [id number] Chief Complaint  Patient presents with   Follow-up    Ultrasound follow up    The patient is seen for follow up evaluation of carotid stenosis status post right ICA stent placed in 2021 with a left placed on 06/20/2020.  There were no post operative problems or complications related to the surgery.  The patient denies neck or incisional pain.  The patient denies interval amaurosis fugax. There is no recent history of TIA symptoms or focal motor deficits. There is no prior documented CVA.  The patient denies headache.  The patient is taking enteric-coated aspirin 81 mg daily.  The patient has a history of coronary artery disease, no recent episodes of angina or shortness of breath. The patient denies PAD or claudication symptoms. There is a history of hyperlipidemia which is being treated with a statin.    There is a 1 to 39% stenosis bilaterally in the internal carotid arteries.  These are both widely patent with no evidence of restenosis of the bilateral stent placements.  Antegrade flow noted in the bilateral vertebral arteries with normal flow hemodynamics in the bilateral subclavian arteries.    Review of Systems  Eyes:  Negative for visual disturbance.  All other systems reviewed and are negative.      Objective:   Physical Exam Vitals reviewed.  HENT:     Head: Normocephalic.  Neck:     Vascular: No carotid bruit.  Cardiovascular:     Rate and Rhythm: Normal rate.     Pulses: Normal pulses.  Pulmonary:     Effort: Pulmonary effort is normal.  Skin:    General: Skin is warm and dry.  Neurological:     Mental Status: He is alert and oriented to person, place, and time.  Psychiatric:        Mood and Affect: Mood normal.        Behavior: Behavior normal.        Thought Content: Thought content normal.        Judgment: Judgment normal.     BP (!) 159/81 (BP  Location: Left Arm)   Pulse 60   Resp 16   Wt 124 lb (56.2 kg)   BMI 20.01 kg/m   Past Medical History:  Diagnosis Date   CAD (coronary artery disease)    a. 09/2012 Acute STEMI/Cath/PCI: LM nl, LAD 66m 30d, D1 50, LCX mild prox plaque, OM1 100 (2.5x16 Promus Premier DES), RCA dom, 317mPDA 30p;  b. 09/2012 Echo: EF 55-60%, PASP 3246m.   Diabetes mellitus without complication (HCC)    HTN (hypertension)    Hyperlipidemia    Tobacco abuse     Social History   Socioeconomic History   Marital status: Single    Spouse name: Not on file   Number of children: Not on file   Years of education: Not on file   Highest education level: Not on file  Occupational History   Not on file  Tobacco Use   Smoking status: Former    Packs/day: 0.50    Years: 50.00    Total pack years: 25.00    Types: Cigarettes    Quit date: 09/27/2012    Years since quitting: 9.7   Smokeless tobacco: Never  Substance and Sexual Activity   Alcohol use: No   Drug use: No   Sexual activity: Not on file  Other Topics Concern   Not on file  Social History Narrative   Not on file   Social Determinants of Health   Financial Resource Strain: Not on file  Food Insecurity: Not on file  Transportation Needs: Not on file  Physical Activity: Not on file  Stress: Not on file  Social Connections: Not on file  Intimate Partner Violence: Not on file    Past Surgical History:  Procedure Laterality Date   CARDIAC CATHETERIZATION     CAROTID PTA/STENT INTERVENTION Right 09/02/2019   Procedure: CAROTID PTA/STENT INTERVENTION;  Surgeon: Algernon Huxley, MD;  Location: [place] [facility];  Service: Cardiovascular;  Laterality: Right;   CAROTID PTA/STENT INTERVENTION Left 06/20/2020   Procedure: CAROTID PTA/STENT INTERVENTION;  Surgeon: Algernon Huxley, MD;  Location: [place] [facility];  Service: Cardiovascular;  Laterality: Left;   CORONARY ANGIOPLASTY  09/2012   a. 09/2012 Acute STEMI/Cath/PCI: LM nl, LAD 9m 30d, D1  50, LCX mild prox plaque, OM1 100 (2.5x16 Promus Premier DES), RCA dom, 369mPDA 30p;  b. 09/2012 Echo: EF 55-60%, PASP 3273m.   LEFT HEART CATH  09/30/2012   Procedure: LEFT HEART CATH;  Surgeon: ChrBurnell BlanksD;  [facility];  Service: Cardiovascular;;   LEFT HEART CATHETERIZATION WITH CORONARY ANGIOGRAM N/A 09/30/2012   Procedure: STEMI;  Surgeon: ChrBurnell BlanksD;  [facility];  Service: Cardiovascular;  Laterality: N/A;   PERCUTANEOUS CORONARY STENT INTERVENTION (PCI-S)  09/30/2012   Procedure: PERCUTANEOUS CORONARY STENT INTERVENTION (PCI-S);  Surgeon: ChrBurnell BlanksD;  [facility];  Service: Cardiovascular;;    Family History  Problem Relation Age of Onset   Heart attack Mother    Hypertension Father    Hyperlipidemia Father     No Known Allergies     Latest Ref Rng & Units 06/23/2022    7:28 PM 08/18/2021   10:39 AM 08/05/2020   11:42 AM  CBC  WBC 4.0 - 10.5 K/uL 6.2  6.0  11.1   Hemoglobin 13.0 - 17.0 g/dL 10.4  11.4  12.5   Hematocrit 39.0 - 52.0 % 33.1  35.9  37.9   Platelets 150 - 400 K/uL 144  228  237       CMP     Component Value Date/Time   NA 141 06/23/2022 1928   NA 136 09/30/2012 1623   K 3.5 06/23/2022 1928   K 4.3 09/30/2012 1623   CL 108 06/23/2022 1928   CL 103 09/30/2012 1623   CO2 25 06/23/2022 1928   CO2 27 09/30/2012 1623   GLUCOSE 86 06/23/2022 1928   GLUCOSE 131 (H) 09/30/2012 1623   BUN 29 (H) 06/23/2022 1928   BUN 23 (H) 09/30/2012 1623   CREATININE 1.47 (H) 06/23/2022 1928   CREATININE 1.92 (H) 09/30/2012 1623   CALCIUM 8.8 (L) 06/23/2022 1928   CALCIUM 9.4 09/30/2012 1623   PROT 7.1 08/18/2021 1039   PROT 6.9 05/31/2015 1455   PROT 7.2 09/30/2012 1623   ALBUMIN 3.7 08/18/2021 1039   ALBUMIN 4.6 05/31/2015 1455   ALBUMIN 3.9 09/30/2012 1623   AST 20 08/18/2021 1039   AST 164 (H) 09/30/2012 1623   ALT 10 08/18/2021 1039   ALT 41 09/30/2012 1623   ALKPHOS 56 08/18/2021  1039   ALKPHOS 74 09/30/2012 1623   BILITOT 1.1 08/18/2021 1039   BILITOT 0.6 05/31/2015 1455   BILITOT 0.6 09/30/2012 1623   GFRNONAA 47 (L) 06/23/2022 1928  GFRNONAA 34 (L) 09/30/2012 1623   GFRAA >60 09/03/2019 0429   GFRAA 39 (L) 09/30/2012 1623     No results found.     Assessment & Plan:   1. Carotid stenosis, bilateral Recommend:  Given the patient's asymptomatic subcritical stenosis no further invasive testing or surgery at this time.  Duplex ultrasound shows 1-39% stenosis bilaterally.  Bilateral ICA stents are patent.  Continue antiplatelet therapy as prescribed Continue management of CAD, HTN and Hyperlipidemia Healthy heart diet,  encouraged exercise at least 4 times per week Follow up in 12 months with duplex ultrasound and physical exam    2. Mixed hyperlipidemia Continue statin as ordered and reviewed, no changes at this time   3. Primary hypertension Continue antihypertensive medications as already ordered, these medications have been reviewed and there are no changes at this time.    No current facility-administered medications on file prior to visit.   Current Outpatient Medications on File Prior to Visit  Medication Sig Dispense Refill   aspirin EC 81 MG EC tablet Take 1 tablet (81 mg total) by mouth daily.     atorvastatin (LIPITOR) 80 MG tablet Take 1 tablet (80 mg total) by mouth daily. 90 tablet 3   cetirizine (ZYRTEC) 10 MG tablet Take 1 tablet by mouth daily.     cholecalciferol (VITAMIN D) 1000 UNITS tablet Take 1,000 Units by mouth daily.     clopidogrel (PLAVIX) 75 MG tablet TAKE 1 TABLET BY MOUTH EVERY DAY 30 tablet 5   donepezil (ARICEPT) 10 MG tablet Take 10 mg by mouth daily.     ferrous sulfate 325 (65 FE) MG tablet Take 1 tablet by mouth daily.     fluticasone (FLONASE) 50 MCG/ACT nasal spray 1 spray into each nostril daily.     ipratropium (ATROVENT) 0.03 % nasal spray Place 2 sprays into both nostrils every 12 (twelve) hours. 30 mL  0   lisinopril (ZESTRIL) 2.5 MG tablet Take 1 tablet by mouth daily.     metoprolol tartrate (LOPRESSOR) 25 MG tablet Take 1 tablet (25 mg total) by mouth 2 (two) times daily. 180 tablet 3   nitroGLYCERIN (NITROSTAT) 0.4 MG SL tablet Place 1 tablet (0.4 mg total) under the tongue every 5 (five) minutes x 3 doses as needed for chest pain. 25 tablet 3   vitamin B-12 (CYANOCOBALAMIN) 1000 MCG tablet Take 1,000 mcg by mouth daily.      There are no Patient Instructions on file for this visit. No follow-ups on file.   Kris Hartmann, NP

## 2022-06-24 NOTE — ED Notes (Signed)
Breakfast tray delivered.

## 2022-06-24 NOTE — ED Provider Notes (Signed)
Consulted with our hospitalist excepted to the care of Dr. Jolene Schimke, MD 06/24/22 [phone]

## 2022-06-24 NOTE — Assessment & Plan Note (Signed)
Continue home antihypertensives 

## 2022-06-25 DIAGNOSIS — E119 Type 2 diabetes mellitus without complications: Secondary | ICD-10-CM

## 2022-06-25 DIAGNOSIS — R531 Weakness: Secondary | ICD-10-CM

## 2022-06-25 DIAGNOSIS — G9340 Encephalopathy, unspecified: Secondary | ICD-10-CM | POA: Diagnosis not present

## 2022-06-25 DIAGNOSIS — I10 Essential (primary) hypertension: Secondary | ICD-10-CM | POA: Diagnosis not present

## 2022-06-25 DIAGNOSIS — U071 COVID-19: Secondary | ICD-10-CM | POA: Diagnosis not present

## 2022-06-25 LAB — C-REACTIVE PROTEIN: CRP: 2.7 mg/dL — ABNORMAL HIGH (ref <1.0)

## 2022-06-25 MED ORDER — LISINOPRIL 5 MG PO TABS
5.0000 mg | ORAL_TABLET | Freq: Every day | ORAL | Status: DC
  Administered 2022-06-25 – 2022-06-26 (×2): 5 mg via ORAL
  Filled 2022-06-25 (×2): qty 1

## 2022-06-25 NOTE — Assessment & Plan Note (Signed)
Patient presenting with 2 to 3-day history of generalized weakness and found to have COVID-19.  No respiratory or abdominal symptoms at this time.  Given advanced age, will start Paxlovid.  - Paxlovid, renal dosing - S/p 1 L bolus

## 2022-06-25 NOTE — Assessment & Plan Note (Signed)
Improved. Patient presented with generalized weakness and found to be disoriented on EDP evaluation.  Most consistent with delirium in the setting of underlying infection possible dehydration.  No focal findings to suggest acute CVA. MRI was also negative for any acute abnormality - Management of COVID-19 as noted below - PT/OT-recommending SNF, patient wants to go home-Home health ordered - Continue home Aricept

## 2022-06-25 NOTE — Progress Notes (Signed)
Physical Therapy Treatment Patient Details Name: Thomas Sexton MRN: [id number] DOB: Sep 27, 1940 Today's Date: 06/25/2022   History of Present Illness Patient is a 82 year old male presenting with complaints of generalized weakness with falls at home. Acute encephalopathy and COVID-19 positive. History of coronary disease STEMI, carotid stenosis and carotid stenting, diabetes hypertension hyperlipidemia    PT Comments    Patient is agreeable to PT. He continues to have intermittent right side lean with decreased awareness. His brother in law in the room confirms that patient leans a little to the right at baseline and uses a cane for ambulation. He continues to require moderate assistance for ambulation with cues for upright posture, to increase step length especially on the right, and to stand closer to rolling walker for support. The patient is fatigued with activity and required seated rest break between bouts of walking. He is not at his baseline level of function or mental status, with increased time required for processing. Recommend to continue PT to maximize independence.    Recommendations for follow up therapy are one component of a multi-disciplinary discharge planning process, led by the attending physician.  Recommendations may be updated based on patient status, additional functional criteria and insurance authorization.  Follow Up Recommendations  Skilled nursing-short term rehab (<3 hours/day) Can patient physically be transported by private vehicle: No   Assistance Recommended at Discharge Frequent or constant Supervision/Assistance  Patient can return home with the following A lot of help with walking and/or transfers;A lot of help with bathing/dressing/bathroom;Help with stairs or ramp for entrance;Assist for transportation;Assistance with cooking/housework   Equipment Recommendations  None recommended by PT    Recommendations for Other Services       Precautions /  Restrictions Precautions Precautions: Fall Restrictions Weight Bearing Restrictions: No     Mobility  Bed Mobility               General bed mobility comments: patient sitting up on arrival and post session    Transfers Overall transfer level: Needs assistance Equipment used: Rolling walker (2 wheels) Transfers: Sit to/from Stand Sit to Stand: Min assist, Min guard           General transfer comment: patient required lifting assistance occasionally for standing, for anterior weight shifting assistance as patient with posterior bias and relying on bed for posterior leg support    Ambulation/Gait Ambulation/Gait assistance: Mod assist Gait Distance (Feet): 16 Feet (x 2 bouts) Assistive device: Rolling walker (2 wheels) Gait Pattern/deviations: Decreased stride length, Decreased stance time - right, Trunk flexed Gait velocity: decreased     General Gait Details: patient has decreased step length bilaterally, more pronounced on the right. moderate verbal cues for upright posture, to stand closer to rolling walker, to increase step length on RLE. steadying assistance provided with ambualtion. short rest break required between bouts of walker   Stairs             Wheelchair Mobility    Modified Rankin (Stroke Patients Only)       Balance Overall balance assessment: Needs assistance Sitting-balance support: Feet supported Sitting balance-Leahy Scale: Poor Sitting balance - Comments: patient continues to have R side lean that can be intermittently correct to midline with cues. patient appears to have decreased awareness of deficits Postural control: Right lateral lean Standing balance support: Bilateral upper extremity supported, Reliant on assistive device for balance, During functional activity  Cognition Arousal/Alertness: Awake/alert Behavior During Therapy: Flat affect Overall Cognitive Status:  Impaired/Different from baseline Area of Impairment: Orientation, Attention, Memory, Following commands, Safety/judgement, Awareness, Problem solving                 Orientation Level: Disoriented to, Place   Memory: Decreased short-term memory Following Commands: Follows one step commands with increased time Safety/Judgement: Decreased awareness of safety, Decreased awareness of deficits   Problem Solving: Slow processing, Decreased initiation, Difficulty sequencing, Requires verbal cues, Requires tactile cues          Exercises      General Comments General comments (skin integrity, edema, etc.): brother in law present and reports patient has a right side lean at baseline intermittently and uses a cane for ambulation      Pertinent Vitals/Pain Pain Assessment Pain Assessment: No/denies pain    Home Living                          Prior Function            PT Goals (current goals can now be found in the care plan section) Acute Rehab PT Goals Patient Stated Goal: to go home PT Goal Formulation: With patient Time For Goal Achievement: 07/08/22 Potential to Achieve Goals: Fair Progress towards PT goals: Progressing toward goals    Frequency    Min 2X/week      PT Plan Current plan remains appropriate    Co-evaluation PT/OT/SLP Co-Evaluation/Treatment: Yes Reason for Co-Treatment: Complexity of the patient's impairments (multi-system involvement);For patient/therapist safety;To address functional/ADL transfers PT goals addressed during session: Mobility/safety with mobility;Balance        AM-PAC PT "6 Clicks" Mobility   Outcome Measure  Help needed turning from your back to your side while in a flat bed without using bedrails?: A Lot Help needed moving from lying on your back to sitting on the side of a flat bed without using bedrails?: A Lot Help needed moving to and from a bed to a chair (including a wheelchair)?: A Lot Help needed  standing up from a chair using your arms (e.g., wheelchair or bedside chair)?: A Lot Help needed to walk in hospital room?: A Lot Help needed climbing 3-5 steps with a railing? : A Lot 6 Click Score: 12    End of Session   Activity Tolerance: Patient limited by fatigue Patient left: in chair (with OT present in room) Nurse Communication: Mobility status PT Visit Diagnosis: Unsteadiness on feet (R26.81)     Time: [id number] PT Time Calculation (min) (ACUTE ONLY): 23 min  Charges:  $Gait Training: 8-22 mins                     [name], PT, MPT    [name] 06/25/2022, 2:21 PM

## 2022-06-25 NOTE — Progress Notes (Addendum)
Occupational Therapy Treatment Patient Details Name: Thomas Sexton MRN: [id number] DOB: June 25, 1940 Today's Date: 06/25/2022   History of present illness Patient is a 82 year old male presenting with complaints of generalized weakness with falls at home. Acute encephalopathy and COVID-19 positive. History of coronary disease STEMI, carotid stenosis and carotid stenting, diabetes hypertension hyperlipidemia   OT comments  Thomas Sexton demonstrates improvement compared to yesterday's rehab session; however, he remains considerable off his PLOF and continues to require Min-Mod A for BADL and fxl mobility. Pt needs Mod A for ambulation, with ongoing cueing for safe use of RW and for upright posture. He takes small, shuffling steps. Pt requires Min A for bed mobility, CGA-Min A for standing, Min A for UB dressing, Max A for LB dressing. Both his sitting and standing balance are poor. Pt fatigues quickly with OOB mobility and requires seated rest breaks after walking ~ 10 ft. While seated in recliner, pt has large amount of diarrhea, of which he is unaware. Stool is very loose, liquid-like, and dark gray, almost black. Requires Max A for clean-up. Due to pt's unsteadiness in standing, part of session conducted jointly with PT. Recommend continued OT during hospitalization. Pt reports he intends to DC home rather than to SNF. Discharge recommendations updated to [place].   Recommendations for follow up therapy are one component of a multi-disciplinary discharge planning process, led by the attending physician.  Recommendations may be updated based on patient status, additional functional criteria and insurance authorization.    Follow Up Recommendations  Home health OT     Assistance Recommended at Discharge Frequent or constant Supervision/Assistance  Patient can return home with the following  A lot of help with walking and/or transfers;A lot of help with bathing/dressing/bathroom;Assistance with  cooking/housework;Assist for transportation;Direct supervision/assist for medications management;Help with stairs or ramp for entrance   Equipment Recommendations       Recommendations for Other Services      Precautions / Restrictions Precautions Precautions: Fall Restrictions Weight Bearing Restrictions: No       Mobility Bed Mobility Overal bed mobility: Needs Assistance Bed Mobility: Supine to Sit, Sit to Supine     Supine to sit: Min assist Sit to supine: Min assist        Transfers Overall transfer level: Needs assistance Equipment used: Rolling walker (2 wheels) Transfers: Sit to/from Stand Sit to Stand: Min assist, Min guard           General transfer comment: patient required lifting assistance occasionally for standing, for anterior weight shifting assistance as patient with posterior bias and relying on bed for posterior leg support     Balance Overall balance assessment: Needs assistance Sitting-balance support: Feet supported Sitting balance-Leahy Scale: Poor Sitting balance - Comments: patient continues to have R side lean that can be intermittently correct to midline with cues. patient appears to have decreased awareness of deficits Postural control: Right lateral lean Standing balance support: Bilateral upper extremity supported, Reliant on assistive device for balance, During functional activity Standing balance-Leahy Scale: Poor Standing balance comment: reliant on UE support; R lateral lean in standing. Repeated cueing for safe use of RW                           ADL either performed or assessed with clinical judgement   ADL Overall ADL's : Needs assistance/impaired             Lower Body Bathing: Sitting/lateral leans;Set up;Minimal  assistance Lower Body Bathing Details (indicate cue type and reason): cueing for task initiation and continuation Upper Body Dressing : Minimal assistance;Sitting   Lower Body Dressing: Sit  to/from stand;Maximal assistance Lower Body Dressing Details (indicate cue type and reason): socks     Toileting- Clothing Manipulation and Hygiene: Maximal assistance;Sit to/from stand;Sitting/lateral lean Toileting - Clothing Manipulation Details (indicate cue type and reason): Max A for clean-up post BM in chair     Functional mobility during ADLs: Rolling walker (2 wheels);Moderate assistance      Extremity/Trunk Assessment Upper Extremity Assessment Upper Extremity Assessment: Generalized weakness   Lower Extremity Assessment Lower Extremity Assessment: RLE deficits/detail RLE Deficits / Details: knee extension 4+/5, hip add 4/5, hip abd 5/5, dorsiflexion 4/5, plantarflexion 5/5 LLE Deficits / Details: grossly 5/5 throughout        Vision       Perception     Praxis      Cognition Arousal/Alertness: Awake/alert Behavior During Therapy: Flat affect Overall Cognitive Status: Impaired/Different from baseline                   Orientation Level: Disoriented to, Place, Situation, Time   Memory: Decreased short-term memory Following Commands: Follows one step commands with increased time Safety/Judgement: Decreased awareness of safety, Decreased awareness of deficits   Problem Solving: Slow processing, Decreased initiation, Difficulty sequencing, Requires verbal cues, Requires tactile cues          Exercises Other Exercises Other Exercises: Educ re: safe use of RW, home safety    Shoulder Instructions       General Comments brother in law present and reports patient has a right side lean at baseline intermittently and uses a cane for ambulation    Pertinent Vitals/ Pain       Pain Assessment Pain Assessment: No/denies pain  Home Living                                          Prior Functioning/Environment              Frequency  Min 2X/week        Progress Toward Goals  OT Goals(current goals can now be found in  the care plan section)  Progress towards OT goals: Progressing toward goals  Acute Rehab OT Goals OT Goal Formulation: With patient Time For Goal Achievement: 07/08/22 Potential to Achieve Goals: Good  Plan Discharge plan needs to be updated;Frequency remains appropriate    Co-evaluation    PT/OT/SLP Co-Evaluation/Treatment: Yes Reason for Co-Treatment: Complexity of the patient's impairments (multi-system involvement);For patient/therapist safety;To address functional/ADL transfers PT goals addressed during session: Mobility/safety with mobility;Balance OT goals addressed during session: ADL's and self-care      AM-PAC OT "6 Clicks" Daily Activity     Outcome Measure   Help from another person eating meals?: A Little Help from another person taking care of personal grooming?: A Little Help from another person toileting, which includes using toliet, bedpan, or urinal?: A Lot Help from another person bathing (including washing, rinsing, drying)?: A Lot Help from another person to put on and taking off regular upper body clothing?: A Little Help from another person to put on and taking off regular lower body clothing?: A Lot 6 Click Score: 15    End of Session Equipment Utilized During Treatment: Rolling walker (2 wheels)  OT Visit Diagnosis: Unsteadiness on feet (  R26.81);History of falling (Z91.81);Muscle weakness (generalized) (M62.81)   Activity Tolerance Patient tolerated treatment well   Patient Left in bed   Nurse Communication Mobility status        Time: [id number] OT Time Calculation (min): 48 min  Charges: OT General Charges $OT Visit: 1 Visit OT Treatments $Self Care/Home Management : 23-37 mins Josiah Lobo, PhD, MS, OTR/L 06/25/22, 3:02 PM

## 2022-06-25 NOTE — Progress Notes (Signed)
Progress Note   Patient: Thomas Sexton [id number] DOB: [birth year] DOA: 06/23/2022     0 DOS: the patient was seen and examined on 06/25/2022   Brief hospital course: Taken from H&P.  DODD SCIANNA is a 82 y.o. male with medical history significant of CAD s/p DES (2014), carotid artery stenosis s/p stent (2021), type 2 diabetes, hyperlipidemia, hypertension, who presents to the ED with complaints of generalized weakness for the past 2 to 3 days,due to this, has had several falls at home where he feels like his legs just give out. He denies hitting his head or any loss of consciousness. He denies any other symptoms at this time including fever, chills, nausea, vomiting, diarrhea, abdominal pain, shortness of breath, cough, chest pain, or palpitations. He denies any focal weakness.   ED Course:  On arrival to the ED, patient was hypertensive at 155/80 with heart rate of 91.  He was saturating at 96% on room air.  Initial workup remarkable for creatinine of 1.47, BUN 29, GFR of 47, WBC of 6.2, hemoglobin 10.4 and platelets of 144.  COVID-19 PCR positive.  CT of the head was obtained that did not show any acute intracranial abnormalities.  Patient was started on Paxlovid.  2/11: Vital stable, afebrile.  PT and OT were recommending SNF.  Due to their concern of leaning towards right, brain MRI was ordered.  CT head was without any acute abnormality. Patient has to complete 10 days of quarantine before proceeding to SNF.  2/12: Mildly elevated blood pressure at 162/45, increasing the dose of lisinopril from 2.5--5  MRI brain was without any acute abnormality.  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 Patient wants to go home with home health which was ordered. We will continue PT for another day.  Stable from COVID standpoint.   Assessment and Plan: * Acute  encephalopathy Improved. Patient presented with generalized weakness and found to be disoriented on EDP evaluation.  Most consistent with delirium in the setting of underlying infection possible dehydration.  No focal findings to suggest acute CVA. MRI was also negative for any acute abnormality - Management of COVID-19 as noted below - PT/OT-recommending SNF, patient wants to go home-Home health ordered - Continue home Aricept  COVID-19 virus infection Patient presenting with 2 to 3-day history of generalized weakness and found to have COVID-19.  No respiratory or abdominal symptoms at this time.  Given advanced age, will start Paxlovid.  - Paxlovid, renal dosing - S/p 1 L bolus  Stage 3a chronic kidney disease ([place]) Renal function currently at baseline.  Will continue to monitor while admitted.  Type 2 diabetes mellitus ([place]) Per chart review, patient has a history of diabetes, but is not currently on any antiglycemic agents and A1c was within normal limits in September 2023.  No indication for SSI at this time  Coronary atherosclerosis of native coronary artery - Continue home aspirin, Plavix and atorvastatin  Hypertension - Continue home antihypertensives   Subjective: Patient was seen and examined today.  No new complaints.  He wants to go home, stating that he stays with his sister and brother-in-law.  Physical Exam: Vitals:   06/24/22 2029 06/25/22 0429 06/25/22 0802 06/25/22 1544  BP: (!) 152/76 (!) 162/45 (!) 148/75 (!) 145/67  Pulse: 75 72 73 61  Resp: 20  17 17  $ Temp: 99 F (37.2 C) 98.7 F (37.1 C)  98.4 F (36.9 C) 98.1 F (36.7 C)  TempSrc: Oral Oral    SpO2: 100% 99% 99% 100%  Weight:      Height:       General.  Frail elderly man, in no acute distress. Pulmonary.  Lungs clear bilaterally, normal respiratory effort. CV.  Regular rate and rhythm, no JVD, rub or murmur. Abdomen.  Soft, nontender, nondistended, BS positive. CNS.  Alert and oriented .  No  focal neurologic deficit. Extremities.  No edema, no cyanosis, pulses intact and symmetrical.  Data Reviewed: Prior data reviewed  Family Communication: Discussed with sister on phone  Disposition: Status is: Observation The patient remains OBS appropriate and will d/c before 2 midnights.  Planned Discharge Destination: Home with home health  DVT prophylaxis.  Lovenox Time spent: 40 minutes  This record has been created using Systems analyst. Errors have been sought and corrected,but may not always be located. Such creation errors do not reflect on the standard of care.   Author: Lorella Nimrod, MD 06/25/2022 3:54 PM  For on call review [email or web address].

## 2022-06-25 NOTE — Assessment & Plan Note (Signed)
Continue home antihypertensives 

## 2022-06-25 NOTE — TOC Transition Note (Addendum)
Transition of Care [facility]) - CM/SW Discharge Note   Patient Details  Name: DAIZON PROPES MRN: [id number] Date of Birth: [birth year]  [facility]) CM/SW Contact:  Gerilyn Pilgrim, LCSW Phone Number: 06/25/2022, 12:34 PM   Clinical Narrative:   CSW spoke with patient sister who reports she has someone on the way to get pt now. She is agreeable to [facility] services and would like to use [facility] for PT/OT/RN. CSW spoke with sister Enid Derry about PCS and CAPP services to get additional assistance at discharge. CSW signing off.     Final next level of care: [place] Barriers to Discharge: Barriers Resolved   Patient Goals and CMS Choice   Choice offered to / list presented to : [facility] POA / Guardian  Discharge Placement                      Patient and family notified of of transfer: 06/25/22  Discharge Plan and Services Additional resources added to the After Visit Summary for                            [facility] Arranged: PT, OT, RN [facility] Agency: [facility]   Time [place]: 1234 Representative spoke with at [place]: [place]  Social Determinants of Health (Black) Interventions SDOH Screenings   Food Insecurity: No Food Insecurity (06/24/2022)  Housing: Low Risk  (06/24/2022)  Transportation Needs: No Transportation Needs (06/24/2022)  Utilities: Not At Risk (06/24/2022)  Tobacco Use: Medium Risk (06/24/2022)     Readmission Risk Interventions     No data to display

## 2022-06-25 NOTE — Care Management Obs Status (Signed)
[place] NOTIFICATION   Patient Details  Name: Thomas Sexton MRN: [id number] Date of Birth: 19-Jul-1940   Medicare Observation Status Notification Given:  Yes    Gerilyn Pilgrim, LCSW 06/25/2022, 12:33 PM

## 2022-06-26 DIAGNOSIS — I10 Essential (primary) hypertension: Secondary | ICD-10-CM | POA: Diagnosis not present

## 2022-06-26 DIAGNOSIS — G9340 Encephalopathy, unspecified: Secondary | ICD-10-CM | POA: Diagnosis not present

## 2022-06-26 DIAGNOSIS — U071 COVID-19: Secondary | ICD-10-CM | POA: Diagnosis not present

## 2022-06-26 DIAGNOSIS — E1122 Type 2 diabetes mellitus with diabetic chronic kidney disease: Secondary | ICD-10-CM

## 2022-06-26 DIAGNOSIS — N1831 Chronic kidney disease, stage 3a: Secondary | ICD-10-CM

## 2022-06-26 DIAGNOSIS — R531 Weakness: Secondary | ICD-10-CM | POA: Diagnosis not present

## 2022-06-26 MED ORDER — NIRMATRELVIR/RITONAVIR (PAXLOVID) TABLET (RENAL DOSING): 2.0000 | ORAL_TABLET | Freq: Two times a day (BID) | ORAL | 0 refills | Status: AC

## 2022-06-26 MED ORDER — LISINOPRIL 5 MG PO TABS: 5.0000 mg | ORAL_TABLET | Freq: Every day | ORAL | 1 refills | Status: DC

## 2022-06-26 NOTE — Progress Notes (Signed)
Physical Therapy Treatment Patient Details Name: Thomas Sexton MRN: [id number] DOB: Sep 01, 1940 Today's Date: 06/26/2022   History of Present Illness Patient is a 82 year old male presenting with complaints of generalized weakness with falls at home. Acute encephalopathy and COVID-19 positive. History of coronary disease STEMI, carotid stenosis and carotid stenting, diabetes hypertension hyperlipidemia    PT Comments    The patient is agreeable to PT. He has increased independence and activity tolerance today compared to previous sessions. The patient walked in hallway with rolling walker with occasional cues for improved gait pattern and balance with carry over demonstrated. Encouraged the patient to use his rolling walker at home instead of the cane for safety. Discharge recommendation updated for home health PT with intermittent family support. The patient confirms that his sister will be able to provide support as needed at home. PT will continue to follow while in the hospital to maximize independence and decrease caregiver burden.    Recommendations for follow up therapy are one component of a multi-disciplinary discharge planning process, led by the attending physician.  Recommendations may be updated based on patient status, additional functional criteria and insurance authorization.  Follow Up Recommendations  Home health PT Can patient physically be transported by private vehicle: Yes   Assistance Recommended at Discharge Intermittent Supervision/Assistance  Patient can return home with the following A little help with walking and/or transfers;A little help with bathing/dressing/bathroom;Help with stairs or ramp for entrance;Assist for transportation   Equipment Recommendations  None recommended by PT    Recommendations for Other Services       Precautions / Restrictions Precautions Precautions: Fall Restrictions Weight Bearing Restrictions: No     Mobility  Bed  Mobility Overal bed mobility: Modified Independent             General bed mobility comments: increased time required, no physical assistance    Transfers Overall transfer level: Needs assistance Equipment used: Rolling walker (2 wheels) Transfers: Sit to/from Stand Sit to Stand: Supervision           General transfer comment: patient does have a posterior bias and requires verbal cues for upright posture and anterior weight shifting    Ambulation/Gait Ambulation/Gait assistance: Min guard Gait Distance (Feet): 100 Feet Assistive device: Rolling walker (2 wheels) Gait Pattern/deviations: Decreased stride length, Decreased step length - right, Knee flexed in stance - right, Knee flexed in stance - left, Trunk flexed Gait velocity: decreased     General Gait Details: less physical assistance required today with ambulation with increased activity tolerance compared to previous sessions. the patient still needs cues for upright posture, to stand closer to rolling walker, and for increased step length on the right with improved gait pattern and posture carry over. encouraged patient to use the rolling walker instead of the cane at this time which he already has at home   Stairs             Wheelchair Mobility    Modified Rankin (Stroke Patients Only)       Balance Overall balance assessment: Needs assistance Sitting-balance support: Feet supported Sitting balance-Leahy Scale: Good     Standing balance support: Bilateral upper extremity supported, Reliant on assistive device for balance, During functional activity Standing balance-Leahy Scale: Fair Standing balance comment: with rolling walker for support while standing                            Cognition Arousal/Alertness:  Awake/alert Behavior During Therapy: Flat affect Overall Cognitive Status: Within Functional Limits for tasks assessed                                 General  Comments: the patient thinks he is at [facility]. patient is able to follow single step commands without difficulty        Exercises      General Comments        Pertinent Vitals/Pain Pain Assessment Pain Assessment: No/denies pain    Home Living                          Prior Function            PT Goals (current goals can now be found in the care plan section) Acute Rehab PT Goals PT Goal Formulation: With patient Time For Goal Achievement: 07/08/22 Potential to Achieve Goals: Good Progress towards PT goals: Progressing toward goals    Frequency    Min 2X/week      PT Plan Discharge plan needs to be updated    Co-evaluation              AM-PAC PT "6 Clicks" Mobility   Outcome Measure  Help needed turning from your back to your side while in a flat bed without using bedrails?: None Help needed moving from lying on your back to sitting on the side of a flat bed without using bedrails?: None Help needed moving to and from a bed to a chair (including a wheelchair)?: A Little Help needed standing up from a chair using your arms (e.g., wheelchair or bedside chair)?: A Little Help needed to walk in hospital room?: A Little Help needed climbing 3-5 steps with a railing? : A Little 6 Click Score: 20    End of Session   Activity Tolerance: Patient tolerated treatment well Patient left: in bed;with call bell/phone within reach;with bed alarm set Nurse Communication: Mobility status PT Visit Diagnosis: Unsteadiness on feet (R26.81)     Time: [id number] PT Time Calculation (min) (ACUTE ONLY): 18 min  Charges:  $Gait Training: 8-22 mins                     Thomas Sexton, PT, MPT    Thomas Sexton 06/26/2022, 10:41 AM

## 2022-06-26 NOTE — Progress Notes (Signed)
Attempted to call patient's sister Hoyle Sauer and brother in [employer] with no success. Will continue to attempt.

## 2022-06-26 NOTE — Progress Notes (Signed)
Patient OOF via w/c in stable condition to private vehicle after verbalizing understanding of all discharge instructions.

## 2022-06-26 NOTE — Discharge Summary (Signed)
Physician Discharge Summary   Patient: Thomas Sexton MRN: [id number] DOB: [birth year]  Admit date:     06/23/2022  Discharge date: 06/26/22  Discharge Physician: Lorella Nimrod   PCP: [name], MD   Recommendations at discharge:  Please obtain CBC and CMP in 1 week Follow-up with primary care provider within a week  Discharge Diagnoses: Principal Problem:   Acute encephalopathy Active Problems:   COVID-19 virus infection   Hypertension   Coronary atherosclerosis of native coronary artery   Type 2 diabetes mellitus ([place])   Stage 3a chronic kidney disease ([place])   Weakness   Hospital Course: Taken from H&P.  [name] is a 82 y.o. male with medical history significant of CAD s/p DES (2014), carotid artery stenosis s/p stent (2021), type 2 diabetes, hyperlipidemia, hypertension, who presents to the ED with complaints of generalized weakness for the past 2 to 3 days,due to this, has had several falls at home where he feels like his legs just give out. He denies hitting his head or any loss of consciousness. He denies any other symptoms at this time including fever, chills, nausea, vomiting, diarrhea, abdominal pain, shortness of breath, cough, chest pain, or palpitations. He denies any focal weakness.   ED Course:  On arrival to the ED, patient was hypertensive at 155/80 with heart rate of 91.  He was saturating at 96% on room air.  Initial workup remarkable for creatinine of 1.47, BUN 29, GFR of 47, WBC of 6.2, hemoglobin 10.4 and platelets of 144.  COVID-19 PCR positive.  CT of the head was obtained that did not show any acute intracranial abnormalities.  Patient was started on Paxlovid.  2/11: Vital stable, afebrile.  PT and OT were recommending SNF.  Due to their concern of leaning towards right, brain MRI was ordered.  CT head was without any acute abnormality. Patient has to complete 10 days of quarantine before proceeding to SNF.  2/12: Mildly elevated blood  pressure at 162/45, increasing the dose of lisinopril from 2.5--5  MRI brain was without any acute abnormality.  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 Patient wants to go home with home health which was ordered. We will continue PT for another day.  Stable from COVID standpoint.  2/13: Hemodynamically stable.  Blood pressure improving after increasing the dose of lisinopril.  Weakness improving and he was able to perform better with PT and now they are recommending home health services which were ordered.  Patient was given couple more days of Paxlovid to complete the course.  He will continue the rest of his home medications except the changes as mentioned above and follow-up with his providers for further recommendations.  Assessment and Plan: * Acute encephalopathy Improved. Patient presented with generalized weakness and found to be disoriented on EDP evaluation.  Most consistent with delirium in the setting of underlying infection possible dehydration.  No focal findings to suggest acute CVA. MRI was also negative for any acute abnormality - Management of COVID-19 as noted below - PT/OT-recommending SNF, patient wants to go home-Home health ordered - Continue home Aricept  COVID-19 virus infection Patient presenting with 2 to 3-day history of generalized weakness and found to have COVID-19.  No respiratory or abdominal symptoms at this time.  Given advanced age, will start Paxlovid.  - Paxlovid, renal dosing - S/p 1 L bolus  Stage 3a chronic  kidney disease ([place]) Renal function currently at baseline.  Will continue to monitor while admitted.  Type 2 diabetes mellitus ([place]) Per chart review, patient has a history of diabetes, but is not currently on any antiglycemic agents and A1c was within normal limits in September 2023.  No indication for SSI at this  time  Coronary atherosclerosis of native coronary artery - Continue home aspirin, Plavix and atorvastatin  Hypertension - Continue home antihypertensives   Consultants: None Procedures performed: None Disposition: Home health Diet recommendation:  Discharge Diet Orders (From admission, onward)     Start     Ordered   06/26/22 0000  Diet - low sodium heart healthy        06/26/22 1038           Cardiac and Carb modified diet DISCHARGE MEDICATION: Allergies as of 06/26/2022   No Known Allergies      Medication List     TAKE these medications    aspirin EC 81 MG tablet Take 1 tablet (81 mg total) by mouth daily.   atorvastatin 80 MG tablet Commonly known as: LIPITOR Take 1 tablet (80 mg total) by mouth daily.   cetirizine 10 MG tablet Commonly known as: ZYRTEC Take 1 tablet by mouth daily.   cholecalciferol 1000 units tablet Commonly known as: VITAMIN D Take 1,000 Units by mouth daily.   clopidogrel 75 MG tablet Commonly known as: PLAVIX TAKE 1 TABLET BY MOUTH EVERY DAY   cyanocobalamin 1000 MCG tablet Commonly known as: VITAMIN B12 Take 1,000 mcg by mouth daily.   donepezil 10 MG tablet Commonly known as: ARICEPT Take 10 mg by mouth daily.   ferrous sulfate 325 (65 FE) MG tablet Take 1 tablet by mouth daily.   fluticasone 50 MCG/ACT nasal spray Commonly known as: FLONASE 1 spray into each nostril daily.   ipratropium 0.03 % nasal spray Commonly known as: ATROVENT Place 2 sprays into both nostrils every 12 (twelve) hours.   lisinopril 5 MG tablet Commonly known as: ZESTRIL Take 1 tablet (5 mg total) by mouth daily. Start taking on: June 27, 2022 What changed:  medication strength how much to take   metoprolol tartrate 25 MG tablet Commonly known as: LOPRESSOR Take 1 tablet (25 mg total) by mouth 2 (two) times daily.   nirmatrelvir/ritonavir (renal dosing) 10 x 150 MG & 10 x 100MG Tabs Commonly known as: PAXLOVID Take 2 tablets by  mouth 2 (two) times daily for 3 days. Patient GFR is >60. Take nirmatrelvir (150 mg) one tablet twice daily for 5 days and ritonavir (100 mg) one tablet twice daily for 5 days.   nitroGLYCERIN 0.4 MG SL tablet Commonly known as: NITROSTAT Place 1 tablet (0.4 mg total) under the tongue every 5 (five) minutes x 3 doses as needed for chest pain.        Follow-up Information     Gollan, Kathlene November, MD. Schedule an appointment as soon as possible for a visit in 1 week(s).   Specialty: Cardiology Contact information: [place] [address] [phone]                Discharge Exam: Danley Danker Weights   06/23/22 1925 06/23/22 2036  Weight: 59.9 kg 61.7 kg   General.  Frail elderly man, in no acute distress. Pulmonary.  Lungs clear bilaterally, normal respiratory effort. CV.  Regular rate and rhythm, no JVD, rub or murmur. Abdomen.  Soft, nontender, nondistended, BS positive. CNS.  Alert  and oriented .  No focal neurologic deficit. Extremities.  No edema, no cyanosis, pulses intact and symmetrical. Psychiatry.  Appears to have some cognitive impairment  Condition at discharge: stable  The results of significant diagnostics from this hospitalization (including imaging, microbiology, ancillary and laboratory) are listed below for reference.   Imaging Studies: MR BRAIN WO CONTRAST  Result Date: 06/24/2022 CLINICAL DATA:  Initial evaluation for acute TIA. EXAM: MRI HEAD WITHOUT CONTRAST TECHNIQUE: Multiplanar, multiecho pulse sequences of the brain and surrounding structures were obtained without intravenous contrast. COMPARISON:  Prior CT from 06/23/2022. FINDINGS: Brain: Mild diffuse prominence of the CSF containing faces, oval with generalized age-related cerebral atrophy. Patchy and confluent T2/FLAIR hyperintensity involving the periventricular deep white matter both cerebral hemispheres, consistent with chronic small vessel ischemic disease, moderately  advanced in nature. Few small remote lacunar infarcts present about the left centrum semi ovale. Small remote cortical infarct present at the left temporoccipital junction. No evidence for acute or subacute ischemia. Gray-white matter differentiation otherwise maintained. No other areas of chronic cortical infarction. No acute intracranial hemorrhage. Few small chronic micro hemorrhages noted, likely small vessel/hypertensive in nature. No mass lesion, midline shift or mass effect. Ventricular prominence related to global parenchymal volume loss without hydrocephalus. Ex vacuo dilatation of the posterior left lateral ventricle related to the chronic left cerebral infarct. Pituitary gland and suprasellar region within normal limits. Vascular: Major intracranial vascular flow voids are maintained. Skull and upper cervical spine: Craniocervical junction within normal limits. Bone marrow signal intensity normal. Small lipoma noted at the left frontal scalp. Scalp soft tissues demonstrate no acute finding. Sinuses/Orbits: Globes orbital soft tissues within normal limits. Paranasal sinuses are largely clear. Trace bilateral mastoid effusions noted, of doubtful significance. Visualized nasopharynx unremarkable. Other: None. IMPRESSION: 1. No acute intracranial abnormality. 2. 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 Electronically Signed   By: Jeannine Boga M.D.   On: 06/24/2022 21:47   DG Abd 1 View  Result Date: 06/24/2022 CLINICAL DATA:  MRI clearance EXAM: ABDOMEN - 1 VIEW COMPARISON:  None Available. FINDINGS: The bowel gas pattern is normal. No radio-opaque calculi or other significant radiographic abnormality are seen. No retained metallic foreign body within the visualized abdomen. IMPRESSION: 1. Negative. Electronically Signed   By: Fidela Salisbury M.D.   On: 06/24/2022 20:22   DG Chest  Port 1 View  Result Date: 06/24/2022 CLINICAL DATA:  Cough EXAM: PORTABLE CHEST 1 VIEW COMPARISON:  None Available. FINDINGS: The heart size and mediastinal contours are within normal limits. Both lungs are clear. The visualized skeletal structures are unremarkable. No unexpected retained metallic foreign body noted within the thorax. IMPRESSION: 1. No active disease. Electronically Signed   By: Fidela Salisbury M.D.   On: 06/24/2022 20:22   CT Head Wo Contrast  Result Date: 06/23/2022 CLINICAL DATA:  Facial paralysis/weakness. Generalized weakness. Confusion. EXAM: CT HEAD WITHOUT CONTRAST TECHNIQUE: Contiguous axial images were obtained from the base of the skull through the vertex without intravenous contrast. RADIATION DOSE REDUCTION: This exam was performed according to the departmental dose-optimization program which includes automated exposure control, adjustment of the mA and/or kV according to patient size and/or use of iterative reconstruction technique. COMPARISON:  None Available. FINDINGS: Brain: No evidence of acute infarction, hemorrhage, hydrocephalus, extra-axial collection or mass lesion/mass effect. There is ventricular sulcal enlargement consistent with mild to moderate diffuse atrophy. Patchy white matter hypoattenuation is also noted bilaterally  consistent with moderate chronic microvascular ischemic change. Vascular: No hyperdense vessel or unexpected calcification. Skull: Normal. Negative for fracture or focal lesion. Sinuses/Orbits: Globes and orbits are unremarkable. The visualized sinuses are clear. Other: None. IMPRESSION: No acute intracranial abnormalities. Electronically Signed   By: Lajean Manes M.D.   On: 06/23/2022 21:20   VAS US CAROTID  Result Date: 06/22/2022 Carotid Arterial Duplex Study Patient Name:  RAIMUNDO SCHOMBERG  Date of Exam:   06/12/2022 Medical Rec #: [id number]          Accession #:    [id number] Date of Birth: 1941-04-30          Patient Gender: M Patient Age:    25 years Exam Location:  [facility] Procedure:      VAS US CAROTID Referring Phys: Eulogio Ditch --------------------------------------------------------------------------------  Other Factors:     Right ICA stent on 09/02/19                     06/20/2020: Left ICA Stent. Comparison Study:  05/2021 Performing Technologist: Concha Norway RVT  Examination Guidelines: A complete evaluation includes B-mode imaging, spectral Doppler, color Doppler, and power Doppler as needed of all accessible portions of each vessel. Bilateral testing is considered an integral part of a complete examination. Limited examinations for reoccurring indications may be performed as noted.  Right Carotid Findings: +----------+--------+--------+--------+------------------+--------+           PSV cm/sEDV cm/sStenosisPlaque DescriptionComments +----------+--------+--------+--------+------------------+--------+ CCA Prox  55      17                                         +----------+--------+--------+--------+------------------+--------+ CCA Mid   70      17                                         +----------+--------+--------+--------+------------------+--------+ CCA Distal60      13                                stent    +----------+--------+--------+--------+------------------+--------+ ICA Prox  69      17      1-39%                     stent    +----------+--------+--------+--------+------------------+--------+ ICA Mid   64      13                                stent    +----------+--------+--------+--------+------------------+--------+ ICA Distal53      12                                         +----------+--------+--------+--------+------------------+--------+ ECA       113     6                                          +----------+--------+--------+--------+------------------+--------+ +----------+--------+-------+----------------+-------------------+  PSV  cm/sEDV cmsDescribe        Arm Pressure (mmHG) +----------+--------+-------+----------------+-------------------+ [id number]             Multiphasic, WNL                    +----------+--------+-------+----------------+-------------------+ +---------+--------+--+--------+-+---------+ VertebralPSV cm/s35EDV cm/s9Antegrade +---------+--------+--+--------+-+---------+  Left Carotid Findings: +----------+--------+--------+--------+------------------+--------+           PSV cm/sEDV cm/sStenosisPlaque DescriptionComments +----------+--------+--------+--------+------------------+--------+ CCA Prox  79      14                                         +----------+--------+--------+--------+------------------+--------+ CCA Mid   77      20                                         +----------+--------+--------+--------+------------------+--------+ CCA Distal101     22      <50%                      stent    +----------+--------+--------+--------+------------------+--------+ ICA Prox  79      20      1-39%                     stent    +----------+--------+--------+--------+------------------+--------+ ICA Mid   64      19                                         +----------+--------+--------+--------+------------------+--------+ ICA Distal49      13                                         +----------+--------+--------+--------+------------------+--------+ ECA       62      6                                          +----------+--------+--------+--------+------------------+--------+ +----------+--------+--------+----------------+-------------------+           PSV cm/sEDV cm/sDescribe        Arm Pressure (mmHG) +----------+--------+--------+----------------+-------------------+ [id number]             Multiphasic, WNL                    +----------+--------+--------+----------------+-------------------+  +---------+--------+--+--------+--+---------+ VertebralPSV cm/s63EDV cm/s20Antegrade +---------+--------+--+--------+--+---------+   Summary: Right Carotid: Velocities in the right ICA are consistent with a 1-39% stenosis.                Widely patent CCA/ICA stent with no evidence restenosis. Left Carotid: Velocities in the left ICA are consistent with a 1-39% stenosis.               Widely patent CCA/ICA stent with no evidence of restenosis. Vertebrals:  Bilateral vertebral arteries demonstrate antegrade flow. Subclavians: Normal flow hemodynamics were seen in bilateral subclavian              arteries. *See table(s) above for measurements and observations.  Electronically signed by Leotis Pain  MD on 06/22/2022 at 9:55:23 AM.    Final     Microbiology: Results for orders placed or performed during the hospital encounter of 06/23/22  Resp Panel by RT-PCR (Flu A&B, Covid) Anterior Nasal Swab     Status: Abnormal   Collection Time: 06/23/22  8:21 PM   Specimen: Anterior Nasal Swab  Result Value Ref Range Status   SARS Coronavirus 2 by RT PCR POSITIVE (A) NEGATIVE Final    Comment: (NOTE) SARS-CoV-2 target nucleic acids are DETECTED.  The SARS-CoV-2 RNA is generally detectable in upper respiratory specimens during the acute phase of infection. Positive results are indicative of the presence of the identified virus, but do not rule out bacterial infection or co-infection with other pathogens not detected by the test. Clinical correlation with patient history and other diagnostic information is necessary to determine patient infection status. The expected result is Negative.  Fact Sheet for Patients: EntrepreneurPulse.com.au  Fact Sheet for Healthcare Providers: IncredibleEmployment.be  This test is not yet approved or cleared by the [place] FDA and  has been authorized for detection and/or diagnosis of SARS-CoV-2 by FDA under an Emergency Use  Authorization (EUA).  This EUA will remain in effect (meaning this test can be used) for the duration of  the COVID-19 declaration under Section 564(b)(1) of the A ct, 21 U.S.C. section 360bbb-3(b)(1), unless the authorization is terminated or revoked sooner.     Influenza A by PCR NEGATIVE NEGATIVE Final   Influenza B by PCR NEGATIVE NEGATIVE Final    Comment: (NOTE) The Xpert Xpress SARS-CoV-2/FLU/RSV plus assay is intended as an aid in the diagnosis of influenza from Nasopharyngeal swab specimens and should not be used as a sole basis for treatment. Nasal washings and aspirates are unacceptable for Xpert Xpress SARS-CoV-2/FLU/RSV testing.  Fact Sheet for Patients: EntrepreneurPulse.com.au  Fact Sheet for Healthcare Providers: IncredibleEmployment.be  This test is not yet approved or cleared by the [place] FDA and has been authorized for detection and/or diagnosis of SARS-CoV-2 by FDA under an Emergency Use Authorization (EUA). This EUA will remain in effect (meaning this test can be used) for the duration of the COVID-19 declaration under Section 564(b)(1) of the Act, 21 U.S.C. section 360bbb-3(b)(1), unless the authorization is terminated or revoked.  Performed at [facility], [address] [place], [place] [address]     Labs: CBC: Recent Labs  Lab 06/23/22 1928  WBC 6.2  HGB 10.4*  HCT 33.1*  MCV 100.9*  PLT 123456*   Basic Metabolic Panel: Recent Labs  Lab 06/23/22 1928 06/24/22 1030  NA 141 139  K 3.5 3.9  CL 108 107  CO2 25 21*  GLUCOSE 86 98  BUN 29* 24*  CREATININE 1.47* 1.35*  CALCIUM 8.8* 8.7*   Liver Function Tests: No results for input(s): "AST", "ALT", "ALKPHOS", "BILITOT", "PROT", "ALBUMIN" in the last 168 hours. CBG: No results for input(s): "GLUCAP" in the last 168 hours.  Discharge time spent: greater than 30 minutes.  This record has been created using TEFL teacher. Errors have been sought and corrected,but may not always be located. Such creation errors do not reflect on the standard of care.   Signed: Lorella Nimrod, MD [facility] 06/26/2022

## 2022-06-26 NOTE — Progress Notes (Signed)
Occupational Therapy Treatment Patient Details Name: Thomas Sexton MRN: [id number] DOB: 10/02/1940 Today's Date: 06/26/2022   History of present illness Patient is a 82 year old male presenting with complaints of generalized weakness with falls at home. Acute encephalopathy and COVID-19 positive. History of coronary disease STEMI, carotid stenosis and carotid stenting, diabetes hypertension hyperlipidemia   OT comments  Thomas Sexton demonstrates considerable improvement since yesterday. He is now more alert and better oriented to situation, he is able to perform bed mobility INDly, shows greater facility in using RW correctly and safely, and displays improved sitting and standing balance. Pt is able to transfer to toilet with Min A and reports he has not had any diarrhea since yesterday afternoon. Recommend DC home with [place].    Recommendations for follow up therapy are one component of a multi-disciplinary discharge planning process, led by the attending physician.  Recommendations may be updated based on patient status, additional functional criteria and insurance authorization.    Follow Up Recommendations  Home health OT     Assistance Recommended at Discharge Frequent or constant Supervision/Assistance  Patient can return home with the following  A little help with walking and/or transfers;A little help with bathing/dressing/bathroom;Assist for transportation;Assistance with cooking/housework   Equipment Recommendations       Recommendations for Other Services      Precautions / Restrictions Precautions Precautions: Fall Restrictions Weight Bearing Restrictions: No       Mobility Bed Mobility Overal bed mobility: Modified Independent                  Transfers Overall transfer level: Needs assistance Equipment used: Rolling walker (2 wheels) Transfers: Sit to/from Stand Sit to Stand: Supervision                 Balance Overall balance assessment:  Needs assistance Sitting-balance support: Feet supported Sitting balance-Leahy Scale: Good     Standing balance support: Bilateral upper extremity supported, Reliant on assistive device for balance, During functional activity Standing balance-Leahy Scale: Fair                             ADL either performed or assessed with clinical judgement   ADL Overall ADL's : Needs assistance/impaired                         Toilet Transfer: Rolling walker (2 wheels);Regular Toilet;Ambulation;Minimal assistance                  Extremity/Trunk Assessment Upper Extremity Assessment Upper Extremity Assessment: Generalized weakness   Lower Extremity Assessment Lower Extremity Assessment: Generalized weakness   Cervical / Trunk Assessment Cervical / Trunk Assessment: Normal    Vision       Perception     Praxis      Cognition Arousal/Alertness: Awake/alert Behavior During Therapy: WFL for tasks assessed/performed Overall Cognitive Status: Within Functional Limits for tasks assessed                                 General Comments: More alert, oriented compared to yesterday        Exercises Other Exercises Other Exercises: Educ re: safe use of RW, home safety    Shoulder Instructions       General Comments      Pertinent Vitals/ Pain       Pain  Assessment Pain Assessment: No/denies pain  Home Living                                          Prior Functioning/Environment              Frequency  Min 2X/week        Progress Toward Goals  OT Goals(current goals can now be found in the care plan section)  Progress towards OT goals: Progressing toward goals  Acute Rehab OT Goals OT Goal Formulation: With patient Time For Goal Achievement: 07/08/22 Potential to Achieve Goals: Good  Plan Discharge plan remains appropriate;Frequency remains appropriate    Co-evaluation                  AM-PAC OT "6 Clicks" Daily Activity     Outcome Measure   Help from another person eating meals?: None Help from another person taking care of personal grooming?: None Help from another person toileting, which includes using toliet, bedpan, or urinal?: A Little Help from another person bathing (including washing, rinsing, drying)?: A Little Help from another person to put on and taking off regular upper body clothing?: None Help from another person to put on and taking off regular lower body clothing?: A Little 6 Click Score: 21    End of Session Equipment Utilized During Treatment: Rolling walker (2 wheels)  OT Visit Diagnosis: Unsteadiness on feet (R26.81);History of falling (Z91.81);Muscle weakness (generalized) (M62.81)   Activity Tolerance Patient tolerated treatment well   Patient Left in bed   Nurse Communication Mobility status        Time: [id number] OT Time Calculation (min): 11 min  Charges: OT General Charges $OT Visit: 1 Visit OT Treatments $Self Care/Home Management : 8-22 mins Josiah Lobo, PhD, MS, OTR/L 06/26/22, 10:04 AM

## 2022-06-26 NOTE — Progress Notes (Signed)
Attempted to reach patient's sister Enid Derry. Message left.

## 2022-07-06 ENCOUNTER — Other Ambulatory Visit: Payer: Self-pay

## 2022-07-06 MED ORDER — LISINOPRIL 5 MG PO TABS: 5.0000 mg | ORAL_TABLET | Freq: Every day | ORAL | 1 refills | Status: DC

## 2022-07-10 ENCOUNTER — Other Ambulatory Visit
Admission: RE | Admit: 2022-07-10 | Discharge: 2022-07-10 | Disposition: A | Discharge status: Home / Self Care | Payer: 59 | Source: Ambulatory Visit | Attending: Cardiology | Admitting: Cardiology

## 2022-07-10 ENCOUNTER — Encounter: Payer: Self-pay | Admitting: Cardiology

## 2022-07-10 ENCOUNTER — Other Ambulatory Visit: Payer: Self-pay

## 2022-07-10 ENCOUNTER — Ambulatory Visit: Payer: 59 | Attending: Cardiology | Admitting: Cardiology

## 2022-07-10 VITALS — BP 124/76 | HR 69 | Ht 68.0 in | Wt 119.2 lb

## 2022-07-10 DIAGNOSIS — I25118 Atherosclerotic heart disease of native coronary artery with other forms of angina pectoris: Secondary | ICD-10-CM

## 2022-07-10 DIAGNOSIS — E1122 Type 2 diabetes mellitus with diabetic chronic kidney disease: Secondary | ICD-10-CM

## 2022-07-10 DIAGNOSIS — N1831 Chronic kidney disease, stage 3a: Secondary | ICD-10-CM

## 2022-07-10 DIAGNOSIS — I6523 Occlusion and stenosis of bilateral carotid arteries: Secondary | ICD-10-CM | POA: Diagnosis not present

## 2022-07-10 DIAGNOSIS — I10 Essential (primary) hypertension: Secondary | ICD-10-CM

## 2022-07-10 DIAGNOSIS — U071 COVID-19: Secondary | ICD-10-CM

## 2022-07-10 LAB — COMPREHENSIVE METABOLIC PANEL
ALT: 15 U/L (ref 0–44)
AST: 29 U/L (ref 15–41)
Albumin: 4 g/dL (ref 3.5–5.0)
Alkaline Phosphatase: 62 U/L (ref 38–126)
Anion gap: 8 (ref 5–15)
BUN: 39 mg/dL — ABNORMAL HIGH (ref 8–23)
CO2: 25 mmol/L (ref 22–32)
Calcium: 9.2 mg/dL (ref 8.9–10.3)
Chloride: 105 mmol/L (ref 98–111)
Creatinine, Ser: 1.75 mg/dL — ABNORMAL HIGH (ref 0.61–1.24)
GFR, Estimated: 38 mL/min — ABNORMAL LOW (ref >60)
Glucose, Bld: 149 mg/dL — ABNORMAL HIGH (ref 70–99)
Potassium: 4 mmol/L (ref 3.5–5.1)
Sodium: 138 mmol/L (ref 135–145)
Total Bilirubin: 0.5 mg/dL (ref 0.3–1.2)
Total Protein: 7.2 g/dL (ref 6.5–8.1)

## 2022-07-10 NOTE — Patient Instructions (Signed)
Medication Instructions:  No changes at this time.   *If you need a refill on your cardiac medications before your next appointment, please call your pharmacy*   Lab Work: [place] over at the [facility]. Stop at registration desk for check in.   If you have labs (blood work) drawn today and your tests are completely normal, you will receive your results only by: [place] (if you have MyChart) OR A paper copy in the mail If you have any lab test that is abnormal or we need to change your treatment, we will call you to review the results.   Testing/Procedures: None   Follow-Up: At [facility], you and your health needs are our priority.  As part of our continuing mission to provide you with exceptional heart care, we have created designated Provider Care Teams.  These Care Teams include your primary Cardiologist (physician) and Advanced Practice Providers (APPs -  Physician Assistants and Nurse Practitioners) who all work together to provide you with the care you need, when you need it.  We recommend signing up for the patient portal called "MyChart".  Sign up information is provided on this After Visit Summary.  MyChart is used to connect with patients for Virtual Visits (Telemedicine).  Patients are able to view lab/test results, encounter notes, upcoming appointments, etc.  Non-urgent messages can be sent to your provider as well.   To learn more about what you can do with MyChart, go to [email or web address].    Your next appointment:   3 month(s)  Provider:   Ida Rogue, MD

## 2022-07-10 NOTE — Telephone Encounter (Signed)
Refill request from [facility], Ritonavir TAB Refused - Medication discontinued Ipratropium Nasal SPR Refused -  none cardiac mediation

## 2022-07-10 NOTE — Progress Notes (Unsigned)
Cardiology Clinic Note   Patient Name: Thomas Sexton Date of Encounter: 07/10/2022  Primary Care Provider:  [name], MD Primary Cardiologist:  Ida Rogue, MD  Patient Profile    82 year old male with past medical history significant for coronary artery disease with ST elevation MI status post PCI/BMS to the OM1 (2014), carotid artery stenosis status post stent placement (2021), type 2 diabetes, hyperlipidemia, hypertension, and recent COVID-19 infection, who is here today for hospital follow-up after recent admission for generalized weakness and COVID 19 infection.  Past Medical History    Past Medical History:  Diagnosis Date   CAD (coronary artery disease)    a. 09/2012 Acute STEMI/Cath/PCI: LM nl, LAD 19m 30d, D1 50, LCX mild prox plaque, OM1 100 (2.5x16 Promus Premier DES), RCA dom, 328mPDA 30p;  b. 09/2012 Echo: EF 55-60%, PASP 3222m.   Diabetes mellitus without complication (HCC)    HTN (hypertension)    Hyperlipidemia    Tobacco abuse    Past Surgical History:  Procedure Laterality Date   CARDIAC CATHETERIZATION     CAROTID PTA/STENT INTERVENTION Right 09/02/2019   Procedure: CAROTID PTA/STENT INTERVENTION;  Surgeon: DewAlgernon HuxleyD;  [facility];  Service: Cardiovascular;  Laterality: Right;   CAROTID PTA/STENT INTERVENTION Left 06/20/2020   Procedure: CAROTID PTA/STENT INTERVENTION;  Surgeon: DewAlgernon HuxleyD;  [facility];  Service: Cardiovascular;  Laterality: Left;   CORONARY ANGIOPLASTY  09/2012   a. 09/2012 Acute STEMI/Cath/PCI: LM nl, LAD 77m20md, D1 50, LCX mild prox plaque, OM1 100 (2.5x16 Promus Premier DES), RCA dom, 33m,3m 30p;  b. 09/2012 Echo: EF 55-60%, PASP 32mmH27m LEFT HEART CATH  09/30/2012   Procedure: LEFT HEART CATH;  Surgeon: ChristBurnell Blanks [facility];  Service: Cardiovascular;;   LEFT HEART CATHETERIZATION WITH CORONARY ANGIOGRAM N/A 09/30/2012   Procedure: STEMI;   Surgeon: ChristBurnell Blanks [facility];  Service: Cardiovascular;  Laterality: N/A;   PERCUTANEOUS CORONARY STENT INTERVENTION (PCI-S)  09/30/2012   Procedure: PERCUTANEOUS CORONARY STENT INTERVENTION (PCI-S);  Surgeon: ChristBurnell Blanks [facility];  Service: Cardiovascular;;    Allergies  No Known Allergies  History of Present Illness    Thomas Sexton yea22old male with the previously mentioned past medical history.  [facility] and was found to have a STEMI where he underwent cardiac catheterization with bare-metal stent placed to the OM1, 40% mid and 30% distal LAD disease, 50% diagonal disease, 30% RCA disease in the mid range with 30% proximal PDA disease.  Echocardiogram revealed normal ejection fraction 55 to 60%.  Carotid ultrasound revealing 60 to 69% bilateral disease in 05/2015, recent carotid duplex 80 to 99% stenosis on the right, with carotid stent placed by vascular to the right in April 2021.  Stent placed on the left in April 2021, carotid ultrasound performed January 2023 showed less than 30% disease bilaterally.  He was last seen in clinic 12/05/2021 by Dr. GollanRockey Situoing fairly well at that time.  There was no changes to his medication regimen and no further testing that was ordered at that time.  [facility] with a chief complaint of weakness.  He has slight confusion, slightly poor historian.  He had stated he had 1 to 2 days of weakness.  Initial blood pressure  158/86, pulse 73, respirations of 17, temperature 98.7.  Pertinent labs on arrival for COVID-19 PCR was positive, BUN 29, serum creatinine 1.47, hemoglobin 10.4, hematocrit 33.1, platelets 144, high-sensitivity troponin negative x 2.  He was treated with antivirals, given IV fluids, continued on majority of his home medications, and was able to be discharged on 06/26/2022.  It was  recommended that he complete 10 days of quarantine before proceeding to this office PT and OT recommended SNF due to concern for bleeding towards the right.  He returns to clinic today stating that he has been doing fairly well.  He stated he did not have to go to SNF postdischarge as recommended by physical therapy and Occupational Therapy that he has been at home.  He continues to ambulate with a cane without difficulty.  Denies any chest pain, shortness of breath, peripheral edema, palpitations or lightheadedness.  He continues to have some mild memory loss that he states is resolving.  Home Medications    Current Outpatient Medications  Medication Sig Dispense Refill   aspirin EC 81 MG EC tablet Take 1 tablet (81 mg total) by mouth daily.     atorvastatin (LIPITOR) 80 MG tablet Take 1 tablet (80 mg total) by mouth daily. 90 tablet 3   cetirizine (ZYRTEC) 10 MG tablet Take 1 tablet by mouth daily.     cholecalciferol (VITAMIN D) 1000 UNITS tablet Take 1,000 Units by mouth daily.     clopidogrel (PLAVIX) 75 MG tablet TAKE 1 TABLET BY MOUTH EVERY DAY 30 tablet 5   donepezil (ARICEPT) 10 MG tablet Take 10 mg by mouth daily.     ferrous sulfate 325 (65 FE) MG tablet Take 1 tablet by mouth daily.     fluticasone (FLONASE) 50 MCG/ACT nasal spray 1 spray into each nostril daily.     ipratropium (ATROVENT) 0.03 % nasal spray Place 2 sprays into both nostrils every 12 (twelve) hours. 30 mL 0   lisinopril (ZESTRIL) 5 MG tablet Take 1 tablet (5 mg total) by mouth daily. 90 tablet 1   metoprolol tartrate (LOPRESSOR) 25 MG tablet Take 1 tablet (25 mg total) by mouth 2 (two) times daily. 180 tablet 3   nitroGLYCERIN (NITROSTAT) 0.4 MG SL tablet Place 1 tablet (0.4 mg total) under the tongue every 5 (five) minutes x 3 doses as needed for chest pain. 25 tablet 3   vitamin B-12 (CYANOCOBALAMIN) 1000 MCG tablet Take 1,000 mcg by mouth daily.     No current facility-administered medications for this visit.      Family History    Family History  Problem Relation Age of Onset   Heart attack Mother    Hypertension Father    Hyperlipidemia Father    He indicated that his mother is deceased. He indicated that his father is deceased.  Social History    Social History   Socioeconomic History   Marital status: Single    Spouse name: Not on file   Number of children: Not on file   Years of education: Not on file   Highest education level: Not on file  Occupational History   Not on file  Tobacco Use   Smoking status: Former    Packs/day: 0.50    Years: 50.00    Total pack years: 25.00    Types: Cigarettes    Quit date: 09/27/2012    Years since quitting: 9.7   Smokeless tobacco: Never  Substance and Sexual Activity   Alcohol use: No  Drug use: No   Sexual activity: Not on file  Other Topics Concern   Not on file  Social History Narrative   Not on file   Social Determinants of Health   Financial Resource Strain: Not on file  Food Insecurity: No Food Insecurity (06/24/2022)   Hunger Vital Sign    Worried About Running Out of Food in the Last Year: Never true    Ran Out of Food in the Last Year: Never true  Transportation Needs: No Transportation Needs (06/24/2022)   PRAPARE - Hydrologist (Medical): No    Lack of Transportation (Non-Medical): No  Physical Activity: Not on file  Stress: Not on file  Social Connections: Not on file  Intimate Partner Violence: Not At Risk (06/24/2022)   Humiliation, Afraid, Rape, and Kick questionnaire    Fear of Current or Ex-Partner: No    Emotionally Abused: No    Physically Abused: No    Sexually Abused: No     Review of Systems    General:  No chills, fever, night sweats or weight changes.  Cardiovascular:  No chest pain, dyspnea on exertion, edema, orthopnea, palpitations, paroxysmal nocturnal dyspnea. Respiratory: No cough, dyspnea Neurologic:  No visual changes, wkns, changes in mental status.   Endorses changes in memory All other systems reviewed and are otherwise negative except as noted above.   Physical Exam    VS:  BP 124/76 (BP Location: Left Arm, Patient Position: Sitting, Cuff Size: Normal)   Pulse 69   Ht '5\' 8"'$  (1.727 m)   Wt 119 lb 3.2 oz (54.1 kg)   SpO2 100%   BMI 18.12 kg/m  , BMI Body mass index is 18.12 kg/m.     GEN: Frail, in no acute distress, ambulating with a cane HEENT: normal. Neck: Supple, no JVD, carotid bruits, or masses. Cardiac: RRR, no murmurs, rubs, or gallops. No clubbing, cyanosis, edema.  Radials 2+/PT 2+ and equal bilaterally.  Respiratory:  Respirations regular and unlabored, clear to auscultation bilaterally. GI: Soft, nontender, nondistended, BS + x 4. MS: no deformity or atrophy. Skin: warm and dry, no rash.  Accessory Clinical Findings    ECG personally reviewed by me today-sinus rhythm with a rate of 69, LVH- No acute changes  Lab Results  Component Value Date   WBC 6.2 06/23/2022   HGB 10.4 (L) 06/23/2022   HCT 33.1 (L) 06/23/2022   MCV 100.9 (H) 06/23/2022   PLT 144 (L) 06/23/2022   Lab Results  Component Value Date   CREATININE 1.35 (H) 06/24/2022   BUN 24 (H) 06/24/2022   NA 139 06/24/2022   K 3.9 06/24/2022   CL 107 06/24/2022   CO2 21 (L) 06/24/2022   Lab Results  Component Value Date   ALT 10 08/18/2021   AST 20 08/18/2021   ALKPHOS 56 08/18/2021   BILITOT 1.1 08/18/2021   Lab Results  Component Value Date   CHOL 94 (L) 05/31/2015   HDL 42 05/31/2015   LDLCALC 37 05/31/2015   TRIG 75 05/31/2015   CHOLHDL 2.2 05/31/2015    No results found for: "HGBA1C"  Assessment & Plan   1.  Coronary artery disease of native coronary artery with stable angina.  He denies any chest pain today.  No ischemic changes noted on EKG.  He has been continued on aspirin 81 mg daily, atorvastatin 80 mg daily, clopidogrel 75 mg daily.  He also has Nitrostat 0.4 mg sublingual as needed that he  states that he has not had to  refused.  2.  Hypertension with a blood pressure today 124/76 that has remained stable.  He is continued on metoprolol to tartrate 25 mg twice daily, and lisinopril 5 mg daily.  3.  Bilateral carotid artery stenosis status post bilateral carotid stenting.  Has appointment with vascular and repeat carotid duplex coming up later on this year.  He is continued on aspirin and Plavix and statin therapy.  4.  Recent COVID-19 infection.  He denies any shortness of breath or cough.  He did take Paxlovid with the only medication side effect being altered taste.  CMP ordered to reevaluate kidney function and liver function after antivirals.  5.  Type 2 diabetes has been well-controlled.  Last hemoglobin A1c was 5.7.  This continues to be managed by his PCP  6.  Disposition patient to return to clinic to see MD/APP in 3 months or sooner if needed.  Paulla Mcclaskey, NP 07/10/2022, 2:10 PM

## 2022-07-12 NOTE — Progress Notes (Signed)
Increase fluids. Kidney function continues to show dehydration. Repeat BMP in 2 weeks.No changes to medication regimen at this time.

## 2022-07-17 ENCOUNTER — Other Ambulatory Visit: Payer: Self-pay | Admitting: *Deleted

## 2022-07-17 ENCOUNTER — Other Ambulatory Visit (INDEPENDENT_AMBULATORY_CARE_PROVIDER_SITE_OTHER): Payer: Self-pay | Admitting: Nurse Practitioner

## 2022-07-17 DIAGNOSIS — N179 Acute kidney failure, unspecified: Secondary | ICD-10-CM

## 2022-07-17 DIAGNOSIS — N1831 Chronic kidney disease, stage 3a: Secondary | ICD-10-CM

## 2022-07-17 DIAGNOSIS — I6523 Occlusion and stenosis of bilateral carotid arteries: Secondary | ICD-10-CM

## 2022-07-17 DIAGNOSIS — I25118 Atherosclerotic heart disease of native coronary artery with other forms of angina pectoris: Secondary | ICD-10-CM

## 2022-07-17 MED ORDER — CLOPIDOGREL BISULFATE 75 MG PO TABS: 75.0000 mg | ORAL_TABLET | Freq: Every day | ORAL | 5 refills | Status: DC

## 2022-07-18 ENCOUNTER — Telehealth: Payer: Self-pay | Admitting: Cardiovascular Disease

## 2022-07-18 NOTE — Telephone Encounter (Signed)
Previously called [facility] to advise patients PCP would need to sign those home health orders. Unable to reach anyone so left message to call back.   Deanna returned my call to discuss orders. Dr. Rockey Situ is listed as patients primary care provider. Inquired if he has PCP not listed in our system. Deanna stated she would call patient to see if he has PCP not listed in our system and then return call to me for further review.

## 2022-07-18 NOTE — Telephone Encounter (Signed)
Left voicemail message requesting Thomas Sexton to please give me a call back in regards to message.

## 2022-07-19 NOTE — Telephone Encounter (Signed)
Deanna with [facility] has found a potential provider to sign the orders they need for the patient. Advised if she should run into any problems give [place] a call back. She verbalized understanding with no further questions.

## 2022-07-19 NOTE — Telephone Encounter (Signed)
  [place] [facility] returning call

## 2022-07-23 ENCOUNTER — Other Ambulatory Visit (INDEPENDENT_AMBULATORY_CARE_PROVIDER_SITE_OTHER): Payer: Self-pay | Admitting: Nurse Practitioner

## 2022-07-23 DIAGNOSIS — I6523 Occlusion and stenosis of bilateral carotid arteries: Secondary | ICD-10-CM

## 2022-07-23 MED ORDER — CLOPIDOGREL BISULFATE 75 MG PO TABS: 75.0000 mg | ORAL_TABLET | Freq: Every day | ORAL | 3 refills | Status: AC

## 2022-07-26 ENCOUNTER — Other Ambulatory Visit
Admission: RE | Admit: 2022-07-26 | Discharge: 2022-07-26 | Disposition: A | Discharge status: Home / Self Care | Payer: 59 | Source: Ambulatory Visit | Attending: Cardiology | Admitting: Cardiology

## 2022-07-26 DIAGNOSIS — N179 Acute kidney failure, unspecified: Secondary | ICD-10-CM | POA: Insufficient documentation

## 2022-07-26 DIAGNOSIS — N1831 Chronic kidney disease, stage 3a: Secondary | ICD-10-CM | POA: Diagnosis present

## 2022-07-26 DIAGNOSIS — I25118 Atherosclerotic heart disease of native coronary artery with other forms of angina pectoris: Secondary | ICD-10-CM | POA: Insufficient documentation

## 2022-07-26 LAB — BASIC METABOLIC PANEL
Anion gap: 10 (ref 5–15)
BUN: 36 mg/dL — ABNORMAL HIGH (ref 8–23)
CO2: 24 mmol/L (ref 22–32)
Calcium: 9 mg/dL (ref 8.9–10.3)
Chloride: 107 mmol/L (ref 98–111)
Creatinine, Ser: 1.67 mg/dL — ABNORMAL HIGH (ref 0.61–1.24)
GFR, Estimated: 40 mL/min — ABNORMAL LOW (ref >60)
Glucose, Bld: 111 mg/dL — ABNORMAL HIGH (ref 70–99)
Potassium: 4.5 mmol/L (ref 3.5–5.1)
Sodium: 141 mmol/L (ref 135–145)

## 2022-08-02 ENCOUNTER — Ambulatory Visit (INDEPENDENT_AMBULATORY_CARE_PROVIDER_SITE_OTHER): Payer: 59 | Admitting: Podiatry

## 2022-08-02 ENCOUNTER — Encounter: Payer: Self-pay | Admitting: Podiatry

## 2022-08-02 VITALS — BP 129/76 | HR 56

## 2022-08-02 DIAGNOSIS — M79675 Pain in left toe(s): Secondary | ICD-10-CM | POA: Diagnosis not present

## 2022-08-02 DIAGNOSIS — B351 Tinea unguium: Secondary | ICD-10-CM

## 2022-08-02 DIAGNOSIS — E0843 Diabetes mellitus due to underlying condition with diabetic autonomic (poly)neuropathy: Secondary | ICD-10-CM | POA: Diagnosis not present

## 2022-08-02 DIAGNOSIS — M79674 Pain in right toe(s): Secondary | ICD-10-CM | POA: Diagnosis not present

## 2022-08-02 NOTE — Progress Notes (Signed)
This patient returns to my office for at risk foot care.  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  This patient is unable to cut nails himself since the patient cannot reach his nails.These nails are painful walking and wearing shoes.  This patient presents for at risk foot care today.  General Appearance  Alert, conversant and in no acute stress.  Vascular  Dorsalis pedis and posterior tibial  pulses are weakly palpable  bilaterally.  Capillary return is within normal limits  bilaterally. Temperature is within normal limits  bilaterally.  Neurologic  Senn-Weinstein monofilament wire test within normal limits  bilaterally. Muscle power within normal limits bilaterally.  Nails Thick disfigured discolored nails with subungual debris  from hallux to fifth toes bilaterally. No evidence of bacterial infection or drainage bilaterally.  Orthopedic  No limitations of motion  feet .  No crepitus or effusions noted.  No bony pathology or digital deformities noted.  Skin  normotropic skin with no porokeratosis noted bilaterally.  No signs of infections or ulcers noted.     Onychomycosis  Pain in right toes  Pain in left toes  Consent was obtained for treatment procedures.   Mechanical debridement of nails 1-5  bilaterally performed with a nail nipper.  Filed with dremel without incident.    Return office visit    3 months                  Told patient to return for periodic foot care and evaluation due to potential at risk complications.   Gardiner Barefoot DPM

## 2022-09-06 ENCOUNTER — Other Ambulatory Visit: Payer: Self-pay | Admitting: Cardiovascular Disease

## 2022-10-21 ENCOUNTER — Emergency Department: Payer: 59

## 2022-10-21 ENCOUNTER — Other Ambulatory Visit: Payer: Self-pay

## 2022-10-21 ENCOUNTER — Emergency Department
Admission: EM | Admit: 2022-10-21 | Discharge: 2022-10-21 | Disposition: A | Discharge status: Home / Self Care | Payer: 59 | Attending: Emergency Medicine | Admitting: Emergency Medicine

## 2022-10-21 DIAGNOSIS — I2510 Atherosclerotic heart disease of native coronary artery without angina pectoris: Secondary | ICD-10-CM | POA: Diagnosis not present

## 2022-10-21 DIAGNOSIS — N189 Chronic kidney disease, unspecified: Secondary | ICD-10-CM | POA: Insufficient documentation

## 2022-10-21 DIAGNOSIS — I129 Hypertensive chronic kidney disease with stage 1 through stage 4 chronic kidney disease, or unspecified chronic kidney disease: Secondary | ICD-10-CM | POA: Diagnosis not present

## 2022-10-21 DIAGNOSIS — R109 Unspecified abdominal pain: Secondary | ICD-10-CM | POA: Insufficient documentation

## 2022-10-21 DIAGNOSIS — E119 Type 2 diabetes mellitus without complications: Secondary | ICD-10-CM | POA: Diagnosis not present

## 2022-10-21 DIAGNOSIS — R531 Weakness: Secondary | ICD-10-CM | POA: Insufficient documentation

## 2022-10-21 DIAGNOSIS — R197 Diarrhea, unspecified: Secondary | ICD-10-CM | POA: Diagnosis present

## 2022-10-21 LAB — COMPREHENSIVE METABOLIC PANEL
ALT: 11 U/L (ref 0–44)
AST: 19 U/L (ref 15–41)
Albumin: 4.3 g/dL (ref 3.5–5.0)
Alkaline Phosphatase: 49 U/L (ref 38–126)
Anion gap: 11 (ref 5–15)
BUN: 30 mg/dL — ABNORMAL HIGH (ref 8–23)
CO2: 24 mmol/L (ref 22–32)
Calcium: 9.1 mg/dL (ref 8.9–10.3)
Chloride: 107 mmol/L (ref 98–111)
Creatinine, Ser: 1.68 mg/dL — ABNORMAL HIGH (ref 0.61–1.24)
GFR, Estimated: 40 mL/min — ABNORMAL LOW (ref >60)
Glucose, Bld: 129 mg/dL — ABNORMAL HIGH (ref 70–99)
Potassium: 4 mmol/L (ref 3.5–5.1)
Sodium: 142 mmol/L (ref 135–145)
Total Bilirubin: 0.9 mg/dL (ref 0.3–1.2)
Total Protein: 7.1 g/dL (ref 6.5–8.1)

## 2022-10-21 LAB — CBC
HCT: 33.8 % — ABNORMAL LOW (ref 39.0–52.0)
Hemoglobin: 10.7 g/dL — ABNORMAL LOW (ref 13.0–17.0)
MCH: 31.4 pg (ref 26.0–34.0)
MCHC: 31.7 g/dL (ref 30.0–36.0)
MCV: 99.1 fL (ref 80.0–100.0)
Platelets: 192 10*3/uL (ref 150–400)
RBC: 3.41 MIL/uL — ABNORMAL LOW (ref 4.22–5.81)
RDW: 12.4 % (ref 11.5–15.5)
WBC: 4.9 10*3/uL (ref 4.0–10.5)
nRBC: 0 % (ref 0.0–0.2)

## 2022-10-21 LAB — LIPASE, BLOOD: Lipase: 58 U/L — ABNORMAL HIGH (ref 11–51)

## 2022-10-21 MED ORDER — SODIUM CHLORIDE 0.9 % IV SOLN: Freq: Once | INTRAVENOUS | Status: AC

## 2022-10-21 MED ORDER — IOHEXOL 300 MG/ML  SOLN
100.0000 mL | Freq: Once | INTRAMUSCULAR | Status: AC | PRN
  Administered 2022-10-21: 80 mL via INTRAVENOUS

## 2022-10-21 NOTE — ED Triage Notes (Signed)
Pt presents to the ED due to diarrhea. Pt's sister states he had multiple episodes of diarrhea that started this morning. Pt's SISTER C/O INCREASING WEAKNESS. pT IS POOR HISTORIAN.

## 2022-10-21 NOTE — ED Provider Notes (Signed)
Patient unable to give stool sample here. Did discuss with patient that he could follow up with primary care. Did feel better after fluids. Was able to get up and walk around the room without difficulty. Stated he wanted to go home. At this time feel that is reasonable.   Phineas Semen, MD 10/21/22 2029

## 2022-10-21 NOTE — Progress Notes (Unsigned)
Cardiology Office Note  Date:  10/21/2022   ID:  Thomas Sexton, DOB [birth year], MRN [id number]  [facility]   No chief complaint on file.   HPI:  Thomas Sexton is a pleasant 82 year old gentleman with PMH of  CAD [facility] on Sep 30 2012 with chest pain,  STEMI, cardiac catheterization Sep 30, 2012  bare-metal stent placed to his OM1. 40% mid and 30% distal LAD disease, 50% diagonal disease, 30% RCA disease in the mid region with 30% proximal PDA disease. Troponin was greater than 20. Echo 10/01/2012 showing normal ejection fraction 55-60% Carotid ultrasound 60 to 79% b/l disease 05/2015 Recent carotid 80 to 99% stenosis on the right Carotid stent on the right April 2021 He presents today for follow-up of his coronary artery disease  Last seen by myself in clinic July 2023 Seen by one of our providers February 2024  Seen in the ER February 2024 for weakness, COVID-positive    Carotid stent placed on left April 2021 PV procedure with Dr. Wyn Quaker February 2022, carotid stent on right  Carotid ultrasound performed January 2023 Less than 39% disease bilaterally Carotid stent placement on the right April 2021  Completed PT at home Sedentary at home, some walking to [employer], occasional grocery shop but no regular exercise or activities at home Does lots of sitting and watching TV  No TIA or CVA Aspirin Plavix  Active walks daily No chest pain or shortness of breath concerning for angina   Labs reviewed HBA1C 5.8 CR 1.5 Total chol elevated, (off lipitor?)   EKG personally reviewed by myself on todays visit Shows sinus bradycardia rate 56 bpm no significant ST or T wave changes    PMH:   has a past medical history of CAD (coronary artery disease), Diabetes mellitus without complication (HCC), HTN (hypertension), Hyperlipidemia, and Tobacco abuse.  PSH:    Past Surgical History:  Procedure Laterality Date   CARDIAC CATHETERIZATION     CAROTID PTA/STENT  INTERVENTION Right 09/02/2019   Procedure: CAROTID PTA/STENT INTERVENTION;  Surgeon: Annice Needy, MD;  Location: [facility];  Service: Cardiovascular;  Laterality: Right;   CAROTID PTA/STENT INTERVENTION Left 06/20/2020   Procedure: CAROTID PTA/STENT INTERVENTION;  Surgeon: Annice Needy, MD;  Location: [facility];  Service: Cardiovascular;  Laterality: Left;   CORONARY ANGIOPLASTY  09/2012   a. 09/2012 Acute STEMI/Cath/PCI: LM nl, LAD 31m, 30d, D1 50, LCX mild prox plaque, OM1 100 (2.5x16 Promus Premier DES), RCA dom, 37m, PDA 30p;  b. 09/2012 Echo: EF 55-60%, PASP .   LEFT HEART CATH  09/30/2012   Procedure: LEFT HEART CATH;  Surgeon: Kathleene Hazel, MD;  Location: [facility] C[facility] LAB;  Service: Cardiovascular;;   LEFT HEART CATHETERIZATION WITH CORONARY ANGIOGRAM N/A 09/30/2012   Procedure: STEMI;  Surgeon: Kathleene Hazel, MD;  Location[facility];  Service: Cardiovascular;  Laterality: N/A;   PERCUTANEOUS CORONARY STENT INTERVENTION (PCI-S)  09/30/2012   Procedure: PERCUTANEOUS CORONARY STENT INTERVENTION (PCI-S);  Surgeon: Kathleene Hazel, MD;  Location: [facility] C[facility] LAB;  Service: Cardiovascular;;    Current Outpatient Medications  Medication Sig Dispense Refill   aspirin EC 81 MG EC tablet Take 1 tablet (81 mg total) by mouth daily.     atorvastatin (LIPITOR) 80 MG tablet TAKE 1 TABLET BY MOUTH DAILY 90 tablet 0   cetirizine (ZYRTEC) 10 MG tablet Take 1 tablet by mouth daily.     cholecalciferol (VITAMIN D) 1000 UNITS tablet  Take 1,000 Units by mouth daily.     clopidogrel (PLAVIX) 75 MG tablet Take 1 tablet (75 mg total) by mouth daily. 90 tablet 3   donepezil (ARICEPT) 10 MG tablet Take 10 mg by mouth daily.     ferrous sulfate 325 (65 FE) MG tablet Take 1 tablet by mouth daily.     fluticasone (FLONASE) 50 MCG/ACT nasal spray 1 spray into each nostril daily.     ipratropium (ATROVENT) 0.03 % nasal spray Place 2 sprays into both nostrils every 12 (twelve)  hours. 30 mL 0   lisinopril (ZESTRIL) 5 MG tablet Take 1 tablet (5 mg total) by mouth daily. 90 tablet 1   metoprolol tartrate (LOPRESSOR) 25 MG tablet TAKE 1 TABLET BY MOUTH TWICE  DAILY 180 tablet 0   nitroGLYCERIN (NITROSTAT) 0.4 MG SL tablet Place 1 tablet (0.4 mg total) under the tongue every 5 (five) minutes x 3 doses as needed for chest pain. 25 tablet 3   vitamin B-12 (CYANOCOBALAMIN) 1000 MCG tablet Take 1,000 mcg by mouth daily.     No current facility-administered medications for this visit.     Allergies:   Patient has no known allergies.   Social History:  The patient  reports that he quit smoking about 10 years ago. His smoking use included cigarettes. He has a 25.00 pack-year smoking history. He has never used smokeless tobacco. He reports that he does not drink alcohol and does not use drugs.   Family History:   family history includes Heart attack in his mother; Hyperlipidemia in his father; Hypertension in his father.    Review of Systems: Review of Systems  Constitutional: Negative.   Respiratory: Negative.    Cardiovascular: Negative.   Gastrointestinal: Negative.   Musculoskeletal: Negative.   Neurological: Negative.   Psychiatric/Behavioral: Negative.    All other systems reviewed and are negative.   PHYSICAL EXAM: VS:  There were no vitals taken for this visit. , BMI There is no height or weight on file to calculate BMI. Constitutional:  oriented to person, place, and time. No distress.  HENT:  Head: Grossly normal Eyes:  no discharge. No scleral icterus.  Neck: No JVD, no carotid bruits  Cardiovascular: Regular rate and rhythm, no murmurs appreciated Pulmonary/Chest: Clear to auscultation bilaterally, no wheezes or rails Abdominal: Soft.  no distension.  no tenderness.  Musculoskeletal: Normal range of motion Neurological:  normal muscle tone. Coordination normal. No atrophy Skin: Skin warm and dry Psychiatric: normal affect, pleasant  Recent  Labs: 06/23/2022: TSH 2.372 10/21/2022: ALT 11; BUN 30; Creatinine, Ser 1.68; Hemoglobin 10.7; Platelets 192; Potassium 4.0; Sodium 142    Lipid Panel Lab Results  Component Value Date   CHOL 94 (L) 05/31/2015   HDL 42 05/31/2015   LDLCALC 37 05/31/2015   TRIG 75 05/31/2015      Wt Readings from Last 3 Encounters:  10/21/22 119 lb 4.3 oz (54.1 kg)  07/10/22 119 lb 3.2 oz (54.1 kg)  06/23/22 136 lb 0.4 oz (61.7 kg)      ASSESSMENT AND PLAN:  Coronary disease with stable angina Currently with no symptoms of angina. No further workup at this time. Continue current medication regimen. Cholesterol was elevated April 2023, stressed the importance of being compliant with his Lipitor daily.  Wife who presents with him today does his medications  Essential hypertension Blood pressure is well controlled on today's visit. No changes made to the medications.  Bilateral carotid artery stenosis Bilateral carotid stenting Most recent  ultrasound less than 39% disease.  On aspirin Plavix, stressed importance of staying on his Lipitor  Tobacco abuse Stopped 2014,  Currently not smoking  Type 2 diabetes, controlled A1c well controlled, 5.7 on last check Recent weight loss   Total encounter time more than 30 minutes  Greater than 50% was spent in counseling and coordination of care with the patient    No orders of the defined types were placed in this encounter.    Signed, Dossie Arbour, M.D., Ph.D. 10/21/2022  [facility] [place], [place] [phone]

## 2022-10-21 NOTE — Discharge Instructions (Signed)
As we discussed you can talk to your doctor about ordering stool studies as an outpatient. Please seek medical attention for any high fevers, chest pain, shortness of breath, change in behavior, persistent vomiting, bloody stool or any other new or concerning symptoms.

## 2022-10-21 NOTE — ED Provider Notes (Signed)
[facility] Provider Note    Event Date/Time   First MD Initiated Contact with Patient 10/21/22 1428     (approximate)   History   Diarrhea   HPI  Thomas Sexton is a 82 y.o. male with a history of CAD, diabetes, hypertension, chronic kidney disease brought in for evaluation of diarrhea.  Caregiver reports the patient has chronic diarrhea, here recently he seems to have had increased weakness and she brought him in because she thought he might need a colonoscopy.  Nonbloody brown diarrhea reportedly, no recent antibiotics.  Patient reports intermittent abdominal cramping     Physical Exam   Triage Vital Signs: ED Triage Vitals  Enc Vitals Group     BP 10/21/22 1339 (!) 163/79     Pulse Rate 10/21/22 1339 (!) 52     Resp 10/21/22 1339 18     Temp 10/21/22 1339 97.6 F (36.4 C)     Temp Source 10/21/22 1339 Oral     SpO2 10/21/22 1339 100 %     Weight 10/21/22 1341 54.1 kg (119 lb 4.3 oz)     Height --      Head Circumference --      Peak Flow --      Pain Score 10/21/22 1341 0     Pain Loc --      Pain Edu? --      Excl. in GC? --     Most recent vital signs: Vitals:   10/21/22 1339  BP: (!) 163/79  Pulse: (!) 52  Resp: 18  Temp: 97.6 F (36.4 C)  SpO2: 100%     General: Awake, no distress.  CV:  Good peripheral perfusion.  Resp:  Normal effort.  Abd:  No distention.  No significant tenderness palpation Other:  Dry mucous membranes   ED Results / Procedures / Treatments   Labs (all labs ordered are listed, but only abnormal results are displayed) Labs Reviewed  LIPASE, BLOOD - Abnormal; Notable for the following components:      Result Value   Lipase 58 (*)    All other components within normal limits  COMPREHENSIVE METABOLIC PANEL - Abnormal; Notable for the following components:   Glucose, Bld 129 (*)    BUN 30 (*)    Creatinine, Ser 1.68 (*)    GFR, Estimated 40 (*)    All other components within normal limits   CBC - Abnormal; Notable for the following components:   RBC 3.41 (*)    Hemoglobin 10.7 (*)    HCT 33.8 (*)    All other components within normal limits  C DIFFICILE QUICK SCREEN W PCR REFLEX    GASTROINTESTINAL PANEL BY PCR, STOOL (REPLACES STOOL CULTURE)  URINALYSIS, ROUTINE W REFLEX MICROSCOPIC     EKG  ED ECG REPORT I, Jene Every, the attending physician, personally viewed and interpreted this ECG.  Date: 10/21/2022  Rhythm: normal sinus rhythm QRS Axis: normal Intervals: normal ST/T Wave abnormalities: normal Narrative Interpretation: no evidence of acute ischemia    RADIOLOGY CT abdomen pelvis pending    PROCEDURES:  Critical Care performed:   Procedures   MEDICATIONS ORDERED IN ED: Medications  0.9 %  sodium chloride infusion ( Intravenous New Bag/Given 10/21/22 1451)     IMPRESSION / MDM / ASSESSMENT AND PLAN / ED COURSE  I reviewed the triage vital signs and the nursing notes. Patient's presentation is most consistent with exacerbation of chronic illness.  Patient with reportedly  chronic diarrhea presents with increased weakness, some abdominal discomfort.  Differential includes colitis, malabsorption, C. difficile, dehydration  Will check GI panel, C. difficile, give IV fluids, obtain CT abdomen pelvis  BUN/creatinine are in line with prior levels, white blood cell count is reassuring  Pending CT scan, have asked my colleague to follow-up on these results.      FINAL CLINICAL IMPRESSION(S) / ED DIAGNOSES   Final diagnoses:  Diarrhea, unspecified type     Rx / DC Orders   ED Discharge Orders     None        Note:  This document was prepared using Dragon voice recognition software and may include unintentional dictation errors.   Jene Every, MD 10/21/22 [phone]

## 2022-10-22 ENCOUNTER — Encounter: Payer: Self-pay | Admitting: Cardiovascular Disease

## 2022-10-22 ENCOUNTER — Ambulatory Visit: Payer: 59 | Attending: Cardiovascular Disease | Admitting: Cardiovascular Disease

## 2022-10-22 VITALS — BP 140/70 | HR 50 | Ht 66.0 in | Wt 119.0 lb

## 2022-10-22 DIAGNOSIS — I6523 Occlusion and stenosis of bilateral carotid arteries: Secondary | ICD-10-CM

## 2022-10-22 DIAGNOSIS — I739 Peripheral vascular disease, unspecified: Secondary | ICD-10-CM

## 2022-10-22 DIAGNOSIS — E1122 Type 2 diabetes mellitus with diabetic chronic kidney disease: Secondary | ICD-10-CM

## 2022-10-22 DIAGNOSIS — I10 Essential (primary) hypertension: Secondary | ICD-10-CM

## 2022-10-22 DIAGNOSIS — K529 Noninfective gastroenteritis and colitis, unspecified: Secondary | ICD-10-CM

## 2022-10-22 DIAGNOSIS — I25118 Atherosclerotic heart disease of native coronary artery with other forms of angina pectoris: Secondary | ICD-10-CM

## 2022-10-22 DIAGNOSIS — N1831 Chronic kidney disease, stage 3a: Secondary | ICD-10-CM | POA: Diagnosis not present

## 2022-10-22 DIAGNOSIS — Z720 Tobacco use: Secondary | ICD-10-CM

## 2022-10-22 DIAGNOSIS — E785 Hyperlipidemia, unspecified: Secondary | ICD-10-CM

## 2022-10-22 MED ORDER — METOPROLOL TARTRATE 25 MG PO TABS: 12.5000 mg | ORAL_TABLET | Freq: Two times a day (BID) | ORAL | 3 refills | Status: AC

## 2022-10-22 NOTE — Patient Instructions (Addendum)
Referral to GI, Dr. Angus Palms for chronic diarrhea 20 pound weight loss  Medication Instructions:  No changes  If you need a refill on your cardiac medications before your next appointment, please call your pharmacy.   Lab work: No new labs needed  Testing/Procedures: No new testing needed  Follow-Up: At [facility], you and your health needs are our priority.  As part of our continuing mission to provide you with exceptional heart care, we have created designated Provider Care Teams.  These Care Teams include your primary Cardiologist (physician) and Advanced Practice Providers (APPs -  Physician Assistants and Nurse Practitioners) who all work together to provide you with the care you need, when you need it.  You will need a follow up appointment in 12 months  Providers on your designated Care Team:   Nicolasa Ducking, NP Eula Listen, PA-C Cadence Fransico Michael, [place]  COVID-19 Vaccine Information can be found at: [email or web address] For questions related to vaccine distribution or appointments, please email [email or web address] or call [phone].

## 2022-11-05 ENCOUNTER — Other Ambulatory Visit: Payer: Self-pay | Admitting: Cardiovascular Disease

## 2022-11-06 ENCOUNTER — Other Ambulatory Visit: Payer: Self-pay | Admitting: Cardiovascular Disease

## 2022-11-26 NOTE — Progress Notes (Unsigned)
Celso Amy, PA-C [address]  [address]  [place], [place] [address]  Main: [phone]  Fax: [phone]   Gastroenterology Consultation  Referring Provider:     [facility] Primary Care Physician:  [facility] Primary Gastroenterologist:  Celso Amy, PA-C / Dr. Wyline Mood   Reason for Consultation:     Chronic diarrhea        HPI:   Thomas Sexton is a 82 y.o. y/o male referred for consultation & management  by [facility].    Here to evaluate chronic diarrhea.  He is here today with his sister who is his caretaker.  He has lived with his sister since 2014.  Patient has never had an EGD or colonoscopy.  PCP ordered stool studies, however they were never completed.  Patient's sister states he has episodes of diarrhea daily with fecal incontinence.  He cannot make it to the bathroom.  History of chronic diarrhea for 10 years.  Still has his gallbladder.  They drink city water.  His sister also has episodes of diarrhea.  Medical history significant for history of MI, CAD, carotid stenosis, PAD, diabetes, encephalopathy, blood clotting disorder, tobacco abuse, memory loss.  Currently takes Plavix and aspirin daily.  Also on iron and vitamin B12.  He [facility] to [facility] ED 10/21/2022 to evaluate diarrhea and weakness.  No rectal bleeding.  Abdominal pelvic CT 10/21/2022 showed mild colonic stool.  No acute GI abnormality.  No bowel obstruction.  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  Mild right kidney atrophy with moderate to severe stenosis of right renal artery.  No bowel inflammation or masses.  Normal pancreas, liver and gallbladder.  Labs 10/21/2022 showed low hemoglobin 10.7, hematocrit 33, MCV 99, platelets 192.  Glucose 129, BUN 30, creatinine 1.68, elevated lipase 58, GFR 40.  Past Medical History:  Diagnosis Date   CAD (coronary artery disease)    a. 09/2012 Acute STEMI/Cath/PCI: LM nl, LAD 67m, 30d, D1 50, LCX mild prox  plaque, OM1 100 (2.5x16 Promus Premier DES), RCA dom, 27m, PDA 30p;  b. 09/2012 Echo: EF 55-60%, PASP .   Diabetes mellitus without complication (HCC)    HTN (hypertension)    Hyperlipidemia    Tobacco abuse     Past Surgical History:  Procedure Laterality Date   CARDIAC CATHETERIZATION     CAROTID PTA/STENT INTERVENTION Right 09/02/2019   Procedure: CAROTID PTA/STENT INTERVENTION;  Surgeon: Annice Needy, MD;  Location: [facility] INVASIVE CV LAB;  Service: Cardiovascular;  Laterality: Right;   CAROTID PTA/STENT INTERVENTION Left 06/20/2020   Procedure: CAROTID PTA/STENT INTERVENTION;  Surgeon: Annice Needy, MD;  Location: [facility] INVASIVE CV LAB;  Service: Cardiovascular;  Laterality: Left;   CORONARY ANGIOPLASTY  09/2012   a. 09/2012 Acute STEMI/Cath/PCI: LM nl, LAD 13m, 30d, D1 50, LCX mild prox plaque, OM1 100 (2.5x16 Promus Premier DES), RCA dom, 79m, PDA 30p;  b. 09/2012 Echo: EF 55-60%, PASP .   LEFT HEART CATH  09/30/2012   Procedure: LEFT HEART CATH;  Surgeon: Kathleene Hazel, MD;  Location: [facility] CATH LAB;  Service: Cardiovascular;;   LEFT HEART CATHETERIZATION WITH CORONARY ANGIOGRAM N/A 09/30/2012   Procedure: STEMI;  Surgeon: Kathleene Hazel, MD;  Location: [facility] CATH LAB;  Service: Cardiovascular;  Laterality: N/A;   PERCUTANEOUS CORONARY STENT INTERVENTION (PCI-S)  09/30/2012   Procedure: PERCUTANEOUS CORONARY STENT INTERVENTION (PCI-S);  Surgeon: Kathleene Hazel, MD;  Location: [facility] CATH LAB;  Service: Cardiovascular;;    Prior to Admission medications   Medication Sig Start Date End Date Taking? Authorizing Provider  aspirin EC 81 MG EC tablet Take 1 tablet (81 mg total) by mouth daily. 10/03/12   Creig Hines, NP  atorvastatin (LIPITOR) 80 MG tablet TAKE 1 TABLET BY MOUTH ONCE  DAILY 11/06/22   Antonieta Iba, MD  cetirizine (ZYRTEC) 10 MG tablet Take 1 tablet by mouth daily. 07/31/21 10/22/22  [provider]  cholecalciferol (VITAMIN D) 1000  UNITS tablet Take 1,000 Units by mouth daily.    [provider]  clopidogrel (PLAVIX) 75 MG tablet Take 1 tablet (75 mg total) by mouth daily. 07/23/22   Georgiana Spinner, NP  donepezil (ARICEPT) 10 MG tablet Take 10 mg by mouth daily.    [provider]  ferrous sulfate 325 (65 FE) MG tablet Take 1 tablet by mouth daily. 01/19/22 01/19/23  [provider]  fluticasone (FLONASE) 50 MCG/ACT nasal spray 1 spray into each nostril daily. 01/16/22 01/16/23  [provider]  ipratropium (ATROVENT) 0.03 % nasal spray Place 2 sprays into both nostrils every 12 (twelve) hours. 04/23/22   Raspet, Erin K, PA-C  lisinopril (ZESTRIL) 5 MG tablet Take 1 tablet (5 mg total) by mouth daily. 07/06/22   Antonieta Iba, MD  metoprolol tartrate (LOPRESSOR) 25 MG tablet Take 0.5 tablets (12.5 mg total) by mouth 2 (two) times daily. 10/22/22   Antonieta Iba, MD  nitroGLYCERIN (NITROSTAT) 0.4 MG SL tablet Place 1 tablet (0.4 mg total) under the tongue every 5 (five) minutes x 3 doses as needed for chest pain. 10/03/12   Creig Hines, NP  vitamin B-12 (CYANOCOBALAMIN) 1000 MCG tablet Take 1,000 mcg by mouth daily.    [provider]    Family History  Problem Relation Age of Onset   Heart attack Mother    Hypertension Father    Hyperlipidemia Father      Social History   Tobacco Use   Smoking status: Former    Current packs/day: 0.00    Average packs/day: 0.5 packs/day for 50.0 years (25.0 ttl pk-yrs)    Types: Cigarettes    Start date: 09/28/1962    Quit date: 09/27/2012    Years since quitting: 10.1   Smokeless tobacco: Never  Vaping Use   Vaping status: Never Used  Substance Use Topics   Alcohol use: No   Drug use: No    Allergies as of 11/27/2022   (No Known Allergies)    Review of Systems:    All systems reviewed and negative except where noted in HPI.   Physical Exam:  BP 131/66   Pulse (!) 58   Temp 97.8 F (36.6 C)   Ht 5\' 6"  (1.676  m)   Wt 117 lb 9.6 oz (53.3 kg)   BMI 18.98 kg/m  No LMP for male patient. Psych:  Alert and cooperative. Normal mood and affect. General:   Alert,  Well-developed, well-nourished, pleasant and cooperative in NAD Head:  Normocephalic and atraumatic. Eyes:  Sclera clear, no icterus.   Conjunctiva pink. Neck:  Supple; no masses or thyromegaly. Lungs:  Respirations even and unlabored.  Clear throughout to auscultation.   No wheezes, crackles, or rhonchi. No acute distress. Heart:  Regular rate and rhythm; no murmurs, clicks, rubs, or gallops. Abdomen:  Normal bowel sounds.  No bruits.  Soft, and non-distended without masses, hepatosplenomegaly or hernias noted.  No Tenderness.  No guarding or rebound  tenderness.    Neurologic:  Alert and oriented x3;  grossly normal neurologically. Psych:  Alert and cooperative. Normal mood and affect.  Imaging Studies: No results found.  Assessment and Plan:   Thomas Sexton is a 82 y.o. y/o male has been referred for chronic diarrhea and anemia.  Uncertain etiology.  I am ordering stool studies and lab work to begin his evaluation.  He has never had an EGD or colonoscopy.  Abdominal pelvic CT 10/2022 showed no acute GI abnormality.  1.  Chronic diarrhea - Worsening  Check stool studies: GI pathogen panel, C. difficile toxin PCR, fecal elastase  Check labs: Celiac, BMP  2.  Anemia  Labs: CBC, iron panel, ferritin, B12, and celiac lab.   3.  Comorbidities: CAD, PAD, diabetes, tobacco use, CKD, currently on aspirin and Plavix.  He would be high risk for EGD and colonoscopy procedures due to advanced age and multiple comorbidities.  Risks and benefits of EGD and colonoscopy were discussed with patient and his sister who is his caregiver.  They declined to schedule EGD and colonoscopy today.  Follow up in 4 weeks with TG.  Celso Amy, PA-C

## 2022-11-27 ENCOUNTER — Ambulatory Visit: Payer: 59 | Admitting: Physician Assistant

## 2022-11-27 ENCOUNTER — Encounter: Payer: Self-pay | Admitting: Physician Assistant

## 2022-11-27 VITALS — BP 131/66 | HR 58 | Temp 97.8°F | Ht 66.0 in | Wt 117.6 lb

## 2022-11-27 DIAGNOSIS — I2510 Atherosclerotic heart disease of native coronary artery without angina pectoris: Secondary | ICD-10-CM | POA: Diagnosis not present

## 2022-11-27 DIAGNOSIS — D649 Anemia, unspecified: Secondary | ICD-10-CM | POA: Diagnosis not present

## 2022-11-27 DIAGNOSIS — R197 Diarrhea, unspecified: Secondary | ICD-10-CM | POA: Diagnosis not present

## 2022-11-28 ENCOUNTER — Encounter: Payer: Self-pay | Admitting: Physician Assistant

## 2022-11-28 LAB — BASIC METABOLIC PANEL
BUN/Creatinine Ratio: 15 (ref 10–24)
BUN: 25 mg/dL (ref 8–27)
CO2: 23 mmol/L (ref 20–29)
Calcium: 9.6 mg/dL (ref 8.6–10.2)
Chloride: 106 mmol/L (ref 96–106)
Creatinine, Ser: 1.65 mg/dL — ABNORMAL HIGH (ref 0.76–1.27)
Glucose: 116 mg/dL — ABNORMAL HIGH (ref 70–99)
Potassium: 4.2 mmol/L (ref 3.5–5.2)
Sodium: 145 mmol/L — ABNORMAL HIGH (ref 134–144)
eGFR: 41 mL/min/{1.73_m2} — ABNORMAL LOW (ref >59)

## 2022-11-28 LAB — CBC
Hematocrit: 30.2 % — ABNORMAL LOW (ref 37.5–51.0)
Hemoglobin: 10.2 g/dL — ABNORMAL LOW (ref 13.0–17.7)
MCH: 31.7 pg (ref 26.6–33.0)
MCHC: 33.8 g/dL (ref 31.5–35.7)
MCV: 94 fL (ref 79–97)
Platelets: 215 10*3/uL (ref 150–450)
RBC: 3.22 x10E6/uL — ABNORMAL LOW (ref 4.14–5.80)
RDW: 11.5 % — ABNORMAL LOW (ref 11.6–15.4)
WBC: 5.1 10*3/uL (ref 3.4–10.8)

## 2022-11-28 LAB — IRON,TIBC AND FERRITIN PANEL
Ferritin: 452 ng/mL — ABNORMAL HIGH (ref 30–400)
Iron Saturation: 43 % (ref 15–55)
Iron: 89 ug/dL (ref 38–169)
Total Iron Binding Capacity: 207 ug/dL — ABNORMAL LOW (ref 250–450)
UIBC: 118 ug/dL (ref 111–343)

## 2022-11-28 LAB — VITAMIN B12: Vitamin B-12: >2000 pg/mL — ABNORMAL HIGH (ref 232–1245)

## 2022-11-30 ENCOUNTER — Telehealth: Payer: Self-pay

## 2022-11-30 DIAGNOSIS — K900 Celiac disease: Secondary | ICD-10-CM

## 2022-11-30 NOTE — Telephone Encounter (Signed)
-----   Message from Celso Amy sent at 11/30/2022  9:35 AM EDT ----- Call and notify patient and his sister who is his caregiver (per DPR): 1.  Celiac test is POSITIVE.  He has Celiac disease (new diagnosis).  He is allergic to gluten and needs to avoid all gluten.  Please refer to nutritionist for Celiac. 2.  Stable Chronic kidney disease. 3.  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 4.  Iron Levels are Normal.  No evidence of Iron Deficiency. 5.  Vitamin B12 is high, yet not harmful.  He does not need to take Vitamin B12. 6.  Continue with plan to do stool studies that were ordered and keep f/u OV with me in 4 weeks.

## 2022-11-30 NOTE — Telephone Encounter (Signed)
Patient sister verbalized understanding of results. She will make appointment with Nutritionist when they call to discuss the celiac disease. Placed referral

## 2022-11-30 NOTE — Progress Notes (Signed)
Call and notify patient and his sister who is his caregiver (per DPR): 1.  Celiac test is POSITIVE.  He has Celiac disease (new diagnosis).  He is allergic to gluten and needs to avoid all gluten.  Please refer to nutritionist for Celiac. 2.  Stable Chronic kidney disease. 3.  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 4.  Iron Levels are Normal.  No evidence of Iron Deficiency. 5.  Vitamin B12 is high, yet not harmful.  He does not need to take Vitamin B12. 6.  Continue with plan to do stool studies that were ordered and keep f/u OV with me in 4 weeks.

## 2022-12-04 LAB — ENDOMYSIAL IGA ANTIBODY

## 2022-12-04 LAB — CELIAC DISEASE AB SCREEN W/RFX: Transglutaminase IgA: 30 U/mL — ABNORMAL HIGH (ref 0–3)

## 2022-12-05 LAB — CELIAC DISEASE AB SCREEN W/RFX
Antigliadin Abs, IgA: 9 units (ref 0–19)
IgA/Immunoglobulin A, Serum: 502 mg/dL — ABNORMAL HIGH (ref 61–437)

## 2023-01-14 ENCOUNTER — Other Ambulatory Visit: Payer: Self-pay | Admitting: Cardiovascular Disease

## 2023-01-23 NOTE — Progress Notes (Unsigned)
Celso Amy, PA-C [address]  [address]  [place], [place] [address]  Main: [phone]  Fax: [phone]   Primary Care Physician: [facility]  Primary Gastroenterologist:  Celso Amy, PA-C / Dr. Midge Minium    CC: Follow-up Celiac Disease, Diarrhea, and anemia  HPI: Thomas Sexton is a 82 y.o. male returns for follow-up of chronic diarrhea and newly diagnosed celiac disease.  I saw him July 2024.  He has had chronic diarrhea for over 10 years.  Labs 11/2022: Transglutaminase moderately elevated at 30, positive for celiac.  He was started on gluten-free diet and referred to nutritionist.  Normal iron and high vitamin B12.  Stable chronic kidney disease with BUN 25, creatinine 1.65, GFR 41.  Stable anemia with hemoglobin 10.2, hematocrit 30, MCV 94.  Stool studies were not completed.  Patient was unable to complete.  Patient's sister states his diarrhea has improved.  Patient states he is having 2 bowel movements daily.  Denies abdominal pain or rectal bleeding.  Weight is up 1 pound in the past 2 months.  He is very thin.  He has never had an EGD or colonoscopy.  He lives with his sister who is his caretaker.  Medical history significant for history of MI, CAD, carotid stenosis, PAD, diabetes, encephalopathy, blood clotting disorder, tobacco abuse, memory loss. Currently takes Plavix and aspirin daily. Also on iron and vitamin B12.   Abdominal pelvic CT 10/21/2022 showed mild colonic stool.  No acute GI abnormality.  No bowel obstruction.  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  Mild right kidney atrophy with moderate to severe stenosis of right renal artery.  No bowel inflammation or masses.  Normal pancreas, liver and gallbladder.    Current Outpatient Medications  Medication Sig Dispense Refill   aspirin EC 81 MG EC tablet Take 1 tablet (81 mg total) by mouth daily.     atorvastatin (LIPITOR) 80 MG tablet TAKE 1 TABLET BY  MOUTH ONCE  DAILY 90 tablet 2   cholecalciferol (VITAMIN D) 1000 UNITS tablet Take 1,000 Units by mouth daily.     clopidogrel (PLAVIX) 75 MG tablet Take 1 tablet (75 mg total) by mouth daily. 90 tablet 3   donepezil (ARICEPT) 10 MG tablet Take 10 mg by mouth daily.     ipratropium (ATROVENT) 0.03 % nasal spray Place 2 sprays into both nostrils every 12 (twelve) hours. 30 mL 0   lisinopril (ZESTRIL) 5 MG tablet TAKE 1 TABLET BY MOUTH DAILY 90 tablet 2   metoprolol tartrate (LOPRESSOR) 25 MG tablet Take 0.5 tablets (12.5 mg total) by mouth 2 (two) times daily. 90 tablet 3   nitroGLYCERIN (NITROSTAT) 0.4 MG SL tablet Place 1 tablet (0.4 mg total) under the tongue every 5 (five) minutes x 3 doses as needed for chest pain. 25 tablet 3   vitamin B-12 (CYANOCOBALAMIN) 1000 MCG tablet Take 1,000 mcg by mouth daily.     cetirizine (ZYRTEC) 10 MG tablet Take 1 tablet by mouth daily.     ferrous sulfate 325 (65 FE) MG tablet Take 1 tablet by mouth daily.     fluticasone (FLONASE) 50 MCG/ACT nasal spray 1 spray into each nostril daily.     No current facility-administered medications for this visit.    Allergies as of 01/24/2023 - Review Complete 01/24/2023  Allergen Reaction Noted   Gluten meal Diarrhea 12/11/2022    Past Medical History:  Diagnosis Date   CAD (coronary artery  disease)    a. 09/2012 Acute STEMI/Cath/PCI: LM nl, LAD 70m, 30d, D1 50, LCX mild prox plaque, OM1 100 (2.5x16 Promus Premier DES), RCA dom, 16m, PDA 30p;  b. 09/2012 Echo: EF 55-60%, PASP .   Diabetes mellitus without complication (HCC)    HTN (hypertension)    Hyperlipidemia    Tobacco abuse     Past Surgical History:  Procedure Laterality Date   CARDIAC CATHETERIZATION     CAROTID PTA/STENT INTERVENTION Right 09/02/2019   Procedure: CAROTID PTA/STENT INTERVENTION;  Surgeon: Annice Needy, MD;  Location: [facility] INVASIVE CV LAB;  Service: Cardiovascular;  Laterality: Right;   CAROTID PTA/STENT INTERVENTION Left  06/20/2020   Procedure: CAROTID PTA/STENT INTERVENTION;  Surgeon: Annice Needy, MD;  Location: [facility] INVASIVE CV LAB;  Service: Cardiovascular;  Laterality: Left;   CORONARY ANGIOPLASTY  09/2012   a. 09/2012 Acute STEMI/Cath/PCI: LM nl, LAD 64m, 30d, D1 50, LCX mild prox plaque, OM1 100 (2.5x16 Promus Premier DES), RCA dom, 57m, PDA 30p;  b. 09/2012 Echo: EF 55-60%, PASP .   LEFT HEART CATH  09/30/2012   Procedure: LEFT HEART CATH;  Surgeon: Kathleene Hazel, MD;  Location: [facility] CATH LAB;  Service: Cardiovascular;;   LEFT HEART CATHETERIZATION WITH CORONARY ANGIOGRAM N/A 09/30/2012   Procedure: STEMI;  Surgeon: Kathleene Hazel, MD;  Location: [facility] CATH LAB;  Service: Cardiovascular;  Laterality: N/A;   PERCUTANEOUS CORONARY STENT INTERVENTION (PCI-S)  09/30/2012   Procedure: PERCUTANEOUS CORONARY STENT INTERVENTION (PCI-S);  Surgeon: Kathleene Hazel, MD;  Location: [facility] CATH LAB;  Service: Cardiovascular;;    Review of Systems:    All systems reviewed and negative except where noted in HPI.   Physical Examination:   BP (!) 170/78   Pulse (!) 55   Temp 98 F (36.7 C)   Ht 5\' 6"  (1.676 m)   Wt 118 lb 12.8 oz (53.9 kg)   BMI 19.17 kg/m   General: Well-nourished, thin, elderly male well-developed in no acute distress.  Lungs: Clear to auscultation bilaterally. Non-labored. Heart: Regular rate and rhythm, no murmurs rubs or gallops.  Abdomen: Bowel sounds are normal; Abdomen is Soft and thin; No hepatosplenomegaly, masses or hernias;  No Abdominal Tenderness; No guarding or rebound tenderness. Neuro: Alert and oriented x 3.  Grossly intact.  He is able to get on and off exam table.  Walks with a cane. Psych: Alert and cooperative, normal mood and affect.  Moderate memory impairment.  Not a good historian.  Imaging Studies: See HPI.  Assessment and Plan:   Thomas Sexton is a 82 y.o. y/o male returns for:  1.  Chronic diarrhea over 10 years - Likely due to celiac  disease.  Schedule EGD & Colonoscopy  He has never had a colonoscopy  2.  Newly diagnosed celiac disease: Celiac serology TTG elevated  Scheduling EGD I discussed risks of EGD with patient to include risk of bleeding, perforation, and risk of sedation.  Patient expressed understanding and agrees to proceed with EGD.   After EGD then start gluten-free diet  Patient education handout was given and discussed.  Refer to nutritionist after EGD.  3.  Chronic anemia suspect due to chronic kidney disease versus celiac.  Recent iron was normal and B12 was high.  He takes iron and B12 supplements.  Scheduling Colonoscopy I discussed risks of colonoscopy with patient to include risk of bleeding, colon perforation, and risk of sedation.  Patient expressed understanding and agrees to proceed with colonoscopy.  4.  Comorbidities: Advanced age, CAD, PAD, PVD, diabetes, dementia  Currently on aspirin and Plavix  Request permission to hold Plavix 5 days prior to EGD/colonoscopy    Celso Amy, PA-C  Follow up in 3 months with TG.

## 2023-01-24 ENCOUNTER — Ambulatory Visit (INDEPENDENT_AMBULATORY_CARE_PROVIDER_SITE_OTHER): Payer: 59 | Admitting: Physician Assistant

## 2023-01-24 ENCOUNTER — Encounter: Payer: Self-pay | Admitting: Physician Assistant

## 2023-01-24 VITALS — BP 140/69 | HR 55 | Temp 98.0°F | Ht 66.0 in | Wt 118.8 lb

## 2023-01-24 DIAGNOSIS — D649 Anemia, unspecified: Secondary | ICD-10-CM

## 2023-01-24 DIAGNOSIS — I739 Peripheral vascular disease, unspecified: Secondary | ICD-10-CM

## 2023-01-24 DIAGNOSIS — R634 Abnormal weight loss: Secondary | ICD-10-CM

## 2023-01-24 DIAGNOSIS — K8681 Exocrine pancreatic insufficiency: Secondary | ICD-10-CM

## 2023-01-24 DIAGNOSIS — I2510 Atherosclerotic heart disease of native coronary artery without angina pectoris: Secondary | ICD-10-CM

## 2023-01-24 DIAGNOSIS — Z87891 Personal history of nicotine dependence: Secondary | ICD-10-CM

## 2023-01-24 DIAGNOSIS — K900 Celiac disease: Secondary | ICD-10-CM

## 2023-01-24 DIAGNOSIS — R197 Diarrhea, unspecified: Secondary | ICD-10-CM

## 2023-01-24 MED ORDER — PEG 3350-KCL-NA BICARB-NACL 420 G PO SOLR: 4000.0000 mL | Freq: Once | ORAL | 0 refills | Status: AC

## 2023-01-28 ENCOUNTER — Telehealth: Payer: Self-pay

## 2023-01-28 NOTE — Telephone Encounter (Signed)
okay

## 2023-01-28 NOTE — Telephone Encounter (Signed)
Pt sister carolyn called to cancel pts procedure he's been placed in a group home they will call back to reschedule

## 2023-01-28 NOTE — Telephone Encounter (Signed)
Received message to cancel patients procedure - patient has been placed in group home- per sister [email or web address] with [facility] .

## 2023-01-29 NOTE — Telephone Encounter (Signed)
Sister called in and left a voicemail stating that she wants to cancel her brother process. She will like to speak to the nurse.

## 2023-01-30 ENCOUNTER — Other Ambulatory Visit: Payer: Self-pay

## 2023-01-30 ENCOUNTER — Emergency Department
Admission: EM | Admit: 2023-01-30 | Discharge: 2023-01-30 | Disposition: A | Discharge status: Home / Self Care | Payer: 59 | Attending: Emergency Medicine | Admitting: Emergency Medicine

## 2023-01-30 DIAGNOSIS — Z951 Presence of aortocoronary bypass graft: Secondary | ICD-10-CM | POA: Insufficient documentation

## 2023-01-30 DIAGNOSIS — R748 Abnormal levels of other serum enzymes: Secondary | ICD-10-CM | POA: Diagnosis not present

## 2023-01-30 DIAGNOSIS — K900 Celiac disease: Secondary | ICD-10-CM | POA: Insufficient documentation

## 2023-01-30 DIAGNOSIS — N189 Chronic kidney disease, unspecified: Secondary | ICD-10-CM | POA: Diagnosis not present

## 2023-01-30 DIAGNOSIS — Z7902 Long term (current) use of antithrombotics/antiplatelets: Secondary | ICD-10-CM | POA: Diagnosis not present

## 2023-01-30 DIAGNOSIS — F0390 Unspecified dementia without behavioral disturbance: Secondary | ICD-10-CM | POA: Insufficient documentation

## 2023-01-30 DIAGNOSIS — Z7982 Long term (current) use of aspirin: Secondary | ICD-10-CM | POA: Diagnosis not present

## 2023-01-30 DIAGNOSIS — I129 Hypertensive chronic kidney disease with stage 1 through stage 4 chronic kidney disease, or unspecified chronic kidney disease: Secondary | ICD-10-CM | POA: Diagnosis not present

## 2023-01-30 DIAGNOSIS — Z79899 Other long term (current) drug therapy: Secondary | ICD-10-CM | POA: Diagnosis not present

## 2023-01-30 DIAGNOSIS — I2510 Atherosclerotic heart disease of native coronary artery without angina pectoris: Secondary | ICD-10-CM | POA: Insufficient documentation

## 2023-01-30 DIAGNOSIS — K529 Noninfective gastroenteritis and colitis, unspecified: Secondary | ICD-10-CM

## 2023-01-30 DIAGNOSIS — R197 Diarrhea, unspecified: Secondary | ICD-10-CM | POA: Diagnosis present

## 2023-01-30 DIAGNOSIS — E1122 Type 2 diabetes mellitus with diabetic chronic kidney disease: Secondary | ICD-10-CM | POA: Diagnosis not present

## 2023-01-30 LAB — COMPREHENSIVE METABOLIC PANEL WITH GFR
ALT: 13 U/L (ref 0–44)
AST: 22 U/L (ref 15–41)
Albumin: 4.1 g/dL (ref 3.5–5.0)
Alkaline Phosphatase: 57 U/L (ref 38–126)
Anion gap: 14 (ref 5–15)
BUN: 31 mg/dL — ABNORMAL HIGH (ref 8–23)
CO2: 25 mmol/L (ref 22–32)
Calcium: 9.6 mg/dL (ref 8.9–10.3)
Chloride: 104 mmol/L (ref 98–111)
Creatinine, Ser: 1.9 mg/dL — ABNORMAL HIGH (ref 0.61–1.24)
GFR, Estimated: 34 mL/min — ABNORMAL LOW (ref >60)
Glucose, Bld: 78 mg/dL (ref 70–99)
Potassium: 3.5 mmol/L (ref 3.5–5.1)
Sodium: 143 mmol/L (ref 135–145)
Total Bilirubin: 0.8 mg/dL (ref 0.3–1.2)
Total Protein: 6.9 g/dL (ref 6.5–8.1)

## 2023-01-30 LAB — LIPASE, BLOOD: Lipase: 62 U/L — ABNORMAL HIGH (ref 11–51)

## 2023-01-30 LAB — CBC
HCT: 34.5 % — ABNORMAL LOW (ref 39.0–52.0)
Hemoglobin: 10.9 g/dL — ABNORMAL LOW (ref 13.0–17.0)
MCH: 31.3 pg (ref 26.0–34.0)
MCHC: 31.6 g/dL (ref 30.0–36.0)
MCV: 99.1 fL (ref 80.0–100.0)
Platelets: 221 10*3/uL (ref 150–400)
RBC: 3.48 MIL/uL — ABNORMAL LOW (ref 4.22–5.81)
RDW: 12.4 % (ref 11.5–15.5)
WBC: 10.1 10*3/uL (ref 4.0–10.5)
nRBC: 0 % (ref 0.0–0.2)

## 2023-01-30 MED ORDER — LOPERAMIDE HCL 2 MG PO CAPS
2.0000 mg | ORAL_CAPSULE | Freq: Once | ORAL | Status: AC
  Administered 2023-01-30: 2 mg via ORAL
  Filled 2023-01-30: qty 1

## 2023-01-30 MED ORDER — SODIUM CHLORIDE 0.9 % IV BOLUS (SEPSIS)
1000.0000 mL | Freq: Once | INTRAVENOUS | Status: AC
  Administered 2023-01-30: 1000 mL via INTRAVENOUS

## 2023-01-30 NOTE — ED Triage Notes (Signed)
Per ems pt's family states pt has had watery stools for several days. Pt denies pain. Pt denies vomiting, abd pain, nausea, fever. Pt with history of dementia, family no present at this time.

## 2023-01-30 NOTE — ED Provider Notes (Signed)
[facility] Provider Note    Event Date/Time   First MD Initiated Contact with Patient 01/30/23 0405     (approximate)   History   Diarrhea   HPI  Thomas Sexton is a 82 y.o. male with history of dementia, CAD, hypertension, diabetes, hyperlipidemia, chronic kidney disease, recent diagnosis of celiac disease, chronic diarrhea who presents to the emergency department with his sister for complaints of diarrhea.  She denies that there has been any change in his diarrhea which she has had for 10 years but states that she was concerned he was dehydrated.  She reports he has been incontinent of stool which is not abnormal for him.  No vomiting.  No complaints of abdominal pain.  No fevers.  He was seen by gastroenterology in July and diagnosed with celiac disease.  They recommended obtaining a stool culture but sister states that "he never tells me when he has to go to the bathroom".  She states tonight she was upset because he got stool on the floor.  She states they are trying to get him placed into a nursing facility.  She states prior to coming into the emergency department they gave him a piece of bread that was not gluten-free.  They have not been adhering to a gluten-free diet.   History provided by patient, sister, brother-in-law.    Past Medical History:  Diagnosis Date   CAD (coronary artery disease)    a. 09/2012 Acute STEMI/Cath/PCI: LM nl, LAD 92m, 30d, D1 50, LCX mild prox plaque, OM1 100 (2.5x16 Promus Premier DES), RCA dom, 80m, PDA 30p;  b. 09/2012 Echo: EF 55-60%, PASP .   Diabetes mellitus without complication (HCC)    HTN (hypertension)    Hyperlipidemia    Tobacco abuse     Past Surgical History:  Procedure Laterality Date   CARDIAC CATHETERIZATION     CAROTID PTA/STENT INTERVENTION Right 09/02/2019   Procedure: CAROTID PTA/STENT INTERVENTION;  Surgeon: Annice Needy, MD;  Location: [facility] INVASIVE CV LAB;  Service: Cardiovascular;   Laterality: Right;   CAROTID PTA/STENT INTERVENTION Left 06/20/2020   Procedure: CAROTID PTA/STENT INTERVENTION;  Surgeon: Annice Needy, MD;  Location: [facility] INVASIVE CV LAB;  Service: Cardiovascular;  Laterality: Left;   CORONARY ANGIOPLASTY  09/2012   a. 09/2012 Acute STEMI/Cath/PCI: LM nl, LAD 63m, 30d, D1 50, LCX mild prox plaque, OM1 100 (2.5x16 Promus Premier DES), RCA dom, 23m, PDA 30p;  b. 09/2012 Echo: EF 55-60%, PASP .   LEFT HEART CATH  09/30/2012   Procedure: LEFT HEART CATH;  Surgeon: Kathleene Hazel, MD;  Location: [facility] CATH LAB;  Service: Cardiovascular;;   LEFT HEART CATHETERIZATION WITH CORONARY ANGIOGRAM N/A 09/30/2012   Procedure: STEMI;  Surgeon: Kathleene Hazel, MD;  Location: [facility] CATH LAB;  Service: Cardiovascular;  Laterality: N/A;   PERCUTANEOUS CORONARY STENT INTERVENTION (PCI-S)  09/30/2012   Procedure: PERCUTANEOUS CORONARY STENT INTERVENTION (PCI-S);  Surgeon: Kathleene Hazel, MD;  Location: [facility] CATH LAB;  Service: Cardiovascular;;    MEDICATIONS:  Prior to Admission medications   Medication Sig Start Date End Date Taking? Authorizing Provider  aspirin EC 81 MG EC tablet Take 1 tablet (81 mg total) by mouth daily. 10/03/12   Creig Hines, NP  atorvastatin (LIPITOR) 80 MG tablet TAKE 1 TABLET BY MOUTH ONCE  DAILY 11/06/22   Antonieta Iba, MD  cetirizine (ZYRTEC) 10 MG tablet Take 1 tablet by mouth daily. 07/31/21 10/22/22  [provider]  cholecalciferol (VITAMIN D) 1000 UNITS tablet Take 1,000 Units by mouth daily.    [provider]  clopidogrel (PLAVIX) 75 MG tablet Take 1 tablet (75 mg total) by mouth daily. 07/23/22   Georgiana Spinner, NP  donepezil (ARICEPT) 10 MG tablet Take 10 mg by mouth daily.    [provider]  ferrous sulfate 325 (65 FE) MG tablet Take 1 tablet by mouth daily. 01/19/22 01/19/23  [provider]  fluticasone (FLONASE) 50 MCG/ACT nasal spray 1 spray into each nostril daily. 01/16/22  01/16/23  [provider]  ipratropium (ATROVENT) 0.03 % nasal spray Place 2 sprays into both nostrils every 12 (twelve) hours. 04/23/22   Raspet, Erin K, PA-C  lisinopril (ZESTRIL) 5 MG tablet TAKE 1 TABLET BY MOUTH DAILY 01/15/23   Antonieta Iba, MD  metoprolol tartrate (LOPRESSOR) 25 MG tablet Take 0.5 tablets (12.5 mg total) by mouth 2 (two) times daily. 10/22/22   Antonieta Iba, MD  nitroGLYCERIN (NITROSTAT) 0.4 MG SL tablet Place 1 tablet (0.4 mg total) under the tongue every 5 (five) minutes x 3 doses as needed for chest pain. 10/03/12   Creig Hines, NP  vitamin B-12 (CYANOCOBALAMIN) 1000 MCG tablet Take 1,000 mcg by mouth daily.    [provider]    Physical Exam   Triage Vital Signs: ED Triage Vitals  Encounter Vitals Group     BP 01/30/23 0140 (!) 150/75     Systolic BP Percentile --      Diastolic BP Percentile --      Pulse Rate 01/30/23 0140 65     Resp 01/30/23 0140 17     Temp 01/30/23 0140 97.6 F (36.4 C)     Temp src --      SpO2 01/30/23 0140 100 %     Weight 01/30/23 0139 121 lb 4.1 oz (55 kg)     Height --      Head Circumference --      Peak Flow --      Pain Score 01/30/23 0139 0     Pain Loc --      Pain Education --      Exclude from Growth Chart --     Most recent vital signs: Vitals:   01/30/23 0500 01/30/23 0520  BP: (!) 170/79   Pulse:  77  Resp: 17   Temp:    SpO2:  100%    CONSTITUTIONAL: Alert, responds appropriately to questions. Well-appearing; well-nourished, elderly HEAD: Normocephalic, atraumatic EYES: Conjunctivae clear, pupils appear equal, sclera nonicteric ENT: normal nose; moist mucous membranes NECK: Supple, normal ROM CARD: RRR; S1 and S2 appreciated RESP: Normal chest excursion without splinting or tachypnea; breath sounds clear and equal bilaterally; no wheezes, no rhonchi, no rales, no hypoxia or respiratory distress, speaking full sentences ABD/GI: Non-distended; soft, non-tender, no  rebound, no guarding, no peritoneal signs BACK: The back appears normal EXT: Normal ROM in all joints; no deformity noted, no edema SKIN: Normal color for age and race; warm; no rash on exposed skin NEURO: Moves all extremities equally, normal speech PSYCH: The patient's mood and manner are appropriate.   ED Results / Procedures / Treatments   LABS: (all labs ordered are listed, but only abnormal results are displayed) Labs Reviewed  COMPREHENSIVE METABOLIC PANEL - Abnormal; Notable for the following components:      Result Value   BUN 31 (*)    Creatinine, Ser 1.90 (*)    GFR, Estimated 34 (*)  All other components within normal limits  CBC - Abnormal; Notable for the following components:   RBC 3.48 (*)    Hemoglobin 10.9 (*)    HCT 34.5 (*)    All other components within normal limits  LIPASE, BLOOD - Abnormal; Notable for the following components:   Lipase 62 (*)    All other components within normal limits     EKG:   RADIOLOGY: My personal review and interpretation of imaging:    I have personally reviewed all radiology reports.   No results found.   PROCEDURES:  Critical Care performed: No     Procedures    IMPRESSION / MDM / ASSESSMENT AND PLAN / ED COURSE  I reviewed the triage vital signs and the nursing notes.    Patient here with concerns for chronic diarrhea.  No significant change in his bowel pattern.  Sister was just concerned because it got on the floor tonight and he is incontinent of stool however this does not appear to be abnormal for him either.  Recently diagnosed with celiac disease but family has not been adhering to a gluten-free diet.  They have gastroenterology follow-up.  The patient is on the cardiac monitor to evaluate for evidence of arrhythmia and/or significant heart rate changes.   DIFFERENTIAL DIAGNOSIS (includes but not limited to):   Chronic diarrhea, doubt C. difficile, doubt infectious diarrhea, doubt colitis given  benign exam, doubt bowel obstruction   Patient's presentation is most consistent with acute complicated illness / injury requiring diagnostic workup.   PLAN: Workup initiated from triage.  Stable anemia.  No leukocytosis.  Creatinine is slightly elevated from baseline of 1.6-1.7 and today is 1.9.  Will give IV fluids.  Normal electrolytes, LFTs.  Lipase minimally elevated which is his baseline.  His abdominal exam is benign.  He presented to the ED with similar symptoms on October 21, 2022 and had a CT scan at that time that showed no acute abnormality.  I do not feel he needs repeat emergent imaging today as this is a chronic issue that seems to be unchanged.  Have strongly recommended that they follow-up with her gastroenterologist and adhere to a gluten-free diet.  Will provide him with supplies to collect a stool sample at home.  If we are able to collect stool here, we will send it for the labs that were ordered by GI in July 2024.   MEDICATIONS GIVEN IN ED: Medications  sodium chloride 0.9 % bolus 1,000 mL (1,000 mLs Intravenous New Bag/Given 01/30/23 0515)  loperamide (IMODIUM) capsule 2 mg (2 mg Oral Given 01/30/23 0516)     ED COURSE: Patient continues to do well here without any episodes of diarrhea.  Will discharge home.  Recommended PCP and GI follow-up.  They are working with her PCP to get patient placed into a facility.   At this time, I do not feel there is any life-threatening condition present. I reviewed all nursing notes, vitals, pertinent previous records.  All lab and urine results, EKGs, imaging ordered have been independently reviewed and interpreted by myself.  I reviewed all available radiology reports from any imaging ordered this visit.  Based on my assessment, I feel the patient is safe to be discharged home without further emergent workup and can continue workup as an outpatient as needed. Discussed all findings, treatment plan as well as usual and customary return  precautions.  They verbalize understanding and are comfortable with this plan.  Outpatient follow-up has been provided  as needed.  All questions have been answered.    CONSULTS:  none   OUTSIDE RECORDS REVIEWED: Reviewed previous PCP and GI notes.       FINAL CLINICAL IMPRESSION(S) / ED DIAGNOSES   Final diagnoses:  Chronic diarrhea  Celiac disease     Rx / DC Orders   ED Discharge Orders     None        Note:  This document was prepared using Dragon voice recognition software and may include unintentional dictation errors.   Bentli Llorente, Layla Maw, DO 01/30/23 [id number]

## 2023-01-30 NOTE — ED Notes (Signed)
Reviewed DC instructions with sister who is pt's caregiver. Sister given instruction on how to arrange respite care, as she kept repeating she really wanted him admitted. Given verbal instructions about dietary needs regarding celiac disease.

## 2023-01-30 NOTE — Discharge Instructions (Addendum)
Please follow-up with your gastroenterologist for further management of his chronic symptoms.  Please avoid gluten in his diet given his diagnosis of celiac disease.  Your gastroenterologist has ordered stool studies.  We have provided you with materials to collect stool at home.

## 2023-02-20 ENCOUNTER — Ambulatory Visit: Admit: 2023-02-20 | Payer: 59 | Admitting: Gastroenterology

## 2023-02-20 SURGERY — COLONOSCOPY WITH PROPOFOL
Anesthesia: General

## 2023-03-04 ENCOUNTER — Emergency Department: Payer: 59

## 2023-03-04 ENCOUNTER — Emergency Department
Admission: EM | Admit: 2023-03-04 | Discharge: 2023-03-04 | Disposition: A | Discharge status: Home / Self Care | Payer: 59 | Attending: Emergency Medicine | Admitting: Emergency Medicine

## 2023-03-04 ENCOUNTER — Other Ambulatory Visit: Payer: Self-pay

## 2023-03-04 DIAGNOSIS — I10 Essential (primary) hypertension: Secondary | ICD-10-CM | POA: Insufficient documentation

## 2023-03-04 DIAGNOSIS — R296 Repeated falls: Secondary | ICD-10-CM | POA: Diagnosis not present

## 2023-03-04 DIAGNOSIS — R5381 Other malaise: Secondary | ICD-10-CM | POA: Diagnosis present

## 2023-03-04 DIAGNOSIS — E119 Type 2 diabetes mellitus without complications: Secondary | ICD-10-CM | POA: Diagnosis not present

## 2023-03-04 DIAGNOSIS — F0390 Unspecified dementia without behavioral disturbance: Secondary | ICD-10-CM | POA: Diagnosis not present

## 2023-03-04 LAB — CBC
HCT: 31.6 % — ABNORMAL LOW (ref 39.0–52.0)
Hemoglobin: 9.9 g/dL — ABNORMAL LOW (ref 13.0–17.0)
MCH: 30.6 pg (ref 26.0–34.0)
MCHC: 31.3 g/dL (ref 30.0–36.0)
MCV: 97.5 fL (ref 80.0–100.0)
Platelets: 145 10*3/uL — ABNORMAL LOW (ref 150–400)
RBC: 3.24 MIL/uL — ABNORMAL LOW (ref 4.22–5.81)
RDW: 13.2 % (ref 11.5–15.5)
WBC: 4.9 10*3/uL (ref 4.0–10.5)
nRBC: 0 % (ref 0.0–0.2)

## 2023-03-04 LAB — BASIC METABOLIC PANEL
Anion gap: 12 (ref 5–15)
BUN: 37 mg/dL — ABNORMAL HIGH (ref 8–23)
CO2: 24 mmol/L (ref 22–32)
Calcium: 9.5 mg/dL (ref 8.9–10.3)
Chloride: 101 mmol/L (ref 98–111)
Creatinine, Ser: 1.57 mg/dL — ABNORMAL HIGH (ref 0.61–1.24)
GFR, Estimated: 43 mL/min — ABNORMAL LOW (ref >60)
Glucose, Bld: 94 mg/dL (ref 70–99)
Potassium: 4.6 mmol/L (ref 3.5–5.1)
Sodium: 137 mmol/L (ref 135–145)

## 2023-03-04 NOTE — ED Provider Notes (Signed)
[facility] Provider Note    Event Date/Time   First MD Initiated Contact with Patient 03/04/23 2054     (approximate)   History   Chief Complaint: Fall   HPI  Thomas Sexton is a 82 y.o. male with a history of hypertension, diabetes, chronic weakness, suspected dementia, currently awaiting SNF placement that is being managed by family and primary care who is brought to the ED due to a fall today.  He has had multiple falls lately.  He was last seen by primary care on February 19, 2023.  Patient denies any complaints, denies hitting his head.  Denies pain.  States that he has not been feeling well for several months, and they have him on a diet and do not let him eat bread.     Physical Exam   Triage Vital Signs: ED Triage Vitals  Encounter Vitals Group     BP 03/04/23 1535 120/79     Systolic BP Percentile --      Diastolic BP Percentile --      Pulse Rate 03/04/23 1535 (!) 59     Resp 03/04/23 1535 18     Temp 03/04/23 1541 98 F (36.7 C)     Temp Source 03/04/23 1541 Oral     SpO2 03/04/23 1535 100 %     Weight 03/04/23 1535 121 lb 4.1 oz (55 kg)     Height 03/04/23 1535 5\' 6"  (1.676 m)     Head Circumference --      Peak Flow --      Pain Score 03/04/23 1535 0     Pain Loc --      Pain Education --      Exclude from Growth Chart --     Most recent vital signs: Vitals:   03/04/23 1535 03/04/23 1541  BP: 120/79   Pulse: (!) 59   Resp: 18   Temp:  98 F (36.7 C)  SpO2: 100%     General: Awake, no distress.  CV:  Good peripheral perfusion.  Regular rate and rhythm Resp:  Normal effort.  Clear to auscultation bilaterally Abd:  No distention.  Soft nontender.  No suprapubic tenderness or bladder fullness. Other:  Full range of motion all extremities, no signs of head trauma.   ED Results / Procedures / Treatments   Labs (all labs ordered are listed, but only abnormal results are displayed) Labs Reviewed  BASIC METABOLIC PANEL  - Abnormal; Notable for the following components:      Result Value   BUN 37 (*)    Creatinine, Ser 1.57 (*)    GFR, Estimated 43 (*)    All other components within normal limits  CBC - Abnormal; Notable for the following components:   RBC 3.24 (*)    Hemoglobin 9.9 (*)    HCT 31.6 (*)    Platelets 145 (*)    All other components within normal limits  URINALYSIS, ROUTINE W REFLEX MICROSCOPIC  CBG MONITORING, ED     EKG Interpreted by me Sinus rhythm rate of 80.  Normal axis intervals QRS ST segments and T waves.  Inferior Q waves suggestive of prior MI.   RADIOLOGY CT head interpreted by me, no intracranial hemorrhage.  Radiology report reviewed.  CT cervical spine unremarkable.   PROCEDURES:  Procedures   MEDICATIONS ORDERED IN ED: Medications - No data to display   IMPRESSION / MDM / ASSESSMENT AND PLAN / ED COURSE  I  reviewed the triage vital signs and the nursing notes.  DDx: Dehydration, electrolyte abnormality, anemia, intracranial hemorrhage, C-spine fracture, dementia, deconditioning  Patient's presentation is most consistent with acute presentation with potential threat to life or bodily function.  Patient presents with fall at home.  Not a reliable historian.  No signs of trauma, no pain or abnormal findings on exam.  CT head cervical spine and labs are unremarkable and reassuring.  Vital signs unremarkable.  He is nontoxic and calm.  Stable for discharge home to continue placement process with family and primary care.       FINAL CLINICAL IMPRESSION(S) / ED DIAGNOSES   Final diagnoses:  Physical deconditioning     Rx / DC Orders   ED Discharge Orders     None        Note:  This document was prepared using Dragon voice recognition software and may include unintentional dictation errors.   Sharman Cheek, MD 03/04/23 2131

## 2023-03-04 NOTE — ED Triage Notes (Signed)
Pt BIB family for falls. Pt has fallen 5 times in pat 2 weeks. Today pt was getting out of bath tub with family help and lost balance and fell hitting left shoulder on side of tub. Did not hit head and no LOC. Pt denies any pain. Family states they do not think he is acting right and concerned with how often he is losing balance.

## 2023-03-04 NOTE — ED Provider Triage Note (Signed)
Emergency Medicine Provider Triage Evaluation Note  Thomas Sexton, a 82 y.o. male  was evaluated in triage.  Pt complains of multiple falls over the last several weeks.  Patient with a history of STEMI, AKI, HTN, diabetes, and encephalopathy presents with family members who are his primary caregivers.  They would endorse some altered mental status over the last few weeks with difficulty with mobility.  Primary caregiver is intending to help the patient evaluated for placement in a skilled nursing facility. The patient denies any injury or pain.  Review of Systems  Positive: AMS, falls Negative: NVD  Physical Exam  BP 120/79 (BP Location: Left Arm)   Pulse (!) 59   Temp 98 F (36.7 C) (Oral)   Resp 18   Ht 5\' 6"  (1.676 m)   Wt 55 kg   SpO2 100%   BMI 19.57 kg/m  Gen:   Awake, no distress  NAD Resp:  Normal effort CTA MSK:   Moves extremities without difficulty  Other:    Medical Decision Making  Medically screening exam initiated at 3:55 PM.  Appropriate orders placed.  [name] was informed that the remainder of the evaluation will be completed by another provider, this initial triage assessment does not replace that evaluation, and the importance of remaining in the ED until their evaluation is complete.  Patient to the ED with family for evaluation of  AMS and multiple falls.    Lissa Hoard, PA-C 03/04/23 1558

## 2023-03-04 NOTE — Discharge Instructions (Addendum)
Lab tests and CT scan of the head today were okay.  Continue taking your medications and following up with your primary care doctor.  Continue the process of pursuing healthcare facility placement.

## 2023-03-04 NOTE — ED Notes (Signed)
Sister at bedside expressing extreme frustration with EDP regarding her state as caregiver. She appears burnt out and tired. RN provided active listening and emotional support as did EDP>

## 2023-04-09 ENCOUNTER — Telehealth: Payer: Self-pay | Admitting: Physician Assistant

## 2023-04-09 NOTE — Telephone Encounter (Signed)
The patient sister called in to check to make sure we got her message about cancelling patient appointment. She said that he now stays in a facility.

## 2023-04-20 ENCOUNTER — Other Ambulatory Visit: Payer: Self-pay | Admitting: Cardiovascular Disease

## 2023-04-22 NOTE — Telephone Encounter (Addendum)
last visit: 10/22/22 with plan to Follow-up in 12 months. Next visit: active recall

## 2023-04-25 ENCOUNTER — Ambulatory Visit: Payer: 59 | Admitting: Physician Assistant

## 2023-06-04 ENCOUNTER — Other Ambulatory Visit (INDEPENDENT_AMBULATORY_CARE_PROVIDER_SITE_OTHER): Payer: Self-pay | Admitting: Nurse Practitioner

## 2023-06-04 DIAGNOSIS — I6523 Occlusion and stenosis of bilateral carotid arteries: Secondary | ICD-10-CM

## 2023-06-11 ENCOUNTER — Ambulatory Visit (INDEPENDENT_AMBULATORY_CARE_PROVIDER_SITE_OTHER): Payer: 59 | Admitting: Vascular Surgery

## 2023-06-11 ENCOUNTER — Encounter (INDEPENDENT_AMBULATORY_CARE_PROVIDER_SITE_OTHER): Payer: 59

## 2023-07-14 ENCOUNTER — Emergency Department (HOSPITAL_COMMUNITY)

## 2023-07-14 ENCOUNTER — Encounter (HOSPITAL_COMMUNITY): Payer: Self-pay | Admitting: Emergency Medicine

## 2023-07-14 ENCOUNTER — Other Ambulatory Visit: Payer: Self-pay

## 2023-07-14 ENCOUNTER — Emergency Department (HOSPITAL_COMMUNITY)
Admission: EM | Admit: 2023-07-14 | Discharge: 2023-07-15 | Disposition: A | Discharge status: Skilled Nursing Facility | Attending: Emergency Medicine | Admitting: Emergency Medicine

## 2023-07-14 DIAGNOSIS — Z7901 Long term (current) use of anticoagulants: Secondary | ICD-10-CM | POA: Diagnosis not present

## 2023-07-14 DIAGNOSIS — E119 Type 2 diabetes mellitus without complications: Secondary | ICD-10-CM | POA: Insufficient documentation

## 2023-07-14 DIAGNOSIS — M25532 Pain in left wrist: Secondary | ICD-10-CM | POA: Insufficient documentation

## 2023-07-14 DIAGNOSIS — I10 Essential (primary) hypertension: Secondary | ICD-10-CM | POA: Diagnosis not present

## 2023-07-14 DIAGNOSIS — Z7982 Long term (current) use of aspirin: Secondary | ICD-10-CM | POA: Diagnosis not present

## 2023-07-14 DIAGNOSIS — I2510 Atherosclerotic heart disease of native coronary artery without angina pectoris: Secondary | ICD-10-CM | POA: Insufficient documentation

## 2023-07-14 DIAGNOSIS — S0990XA Unspecified injury of head, initial encounter: Secondary | ICD-10-CM | POA: Diagnosis present

## 2023-07-14 DIAGNOSIS — Z79899 Other long term (current) drug therapy: Secondary | ICD-10-CM | POA: Insufficient documentation

## 2023-07-14 DIAGNOSIS — M1120 Other chondrocalcinosis, unspecified site: Secondary | ICD-10-CM

## 2023-07-14 LAB — CBC WITH DIFFERENTIAL/PLATELET
Abs Immature Granulocytes: 0.07 10*3/uL (ref 0.00–0.07)
Basophils Absolute: 0 10*3/uL (ref 0.0–0.1)
Basophils Relative: 0 %
Eosinophils Absolute: 0 10*3/uL (ref 0.0–0.5)
Eosinophils Relative: 0 %
HCT: 28.9 % — ABNORMAL LOW (ref 39.0–52.0)
Hemoglobin: 9.2 g/dL — ABNORMAL LOW (ref 13.0–17.0)
Immature Granulocytes: 1 %
Lymphocytes Relative: 9 %
Lymphs Abs: 1.1 10*3/uL (ref 0.7–4.0)
MCH: 29.9 pg (ref 26.0–34.0)
MCHC: 31.8 g/dL (ref 30.0–36.0)
MCV: 93.8 fL (ref 80.0–100.0)
Monocytes Absolute: 0.7 10*3/uL (ref 0.1–1.0)
Monocytes Relative: 6 %
Neutro Abs: 9.9 10*3/uL — ABNORMAL HIGH (ref 1.7–7.7)
Neutrophils Relative %: 84 %
Platelets: 207 10*3/uL (ref 150–400)
RBC: 3.08 MIL/uL — ABNORMAL LOW (ref 4.22–5.81)
RDW: 13.8 % (ref 11.5–15.5)
WBC: 11.7 10*3/uL — ABNORMAL HIGH (ref 4.0–10.5)
nRBC: 0 % (ref 0.0–0.2)

## 2023-07-14 LAB — COMPREHENSIVE METABOLIC PANEL
ALT: 15 U/L (ref 0–44)
AST: 22 U/L (ref 15–41)
Albumin: 3.2 g/dL — ABNORMAL LOW (ref 3.5–5.0)
Alkaline Phosphatase: 79 U/L (ref 38–126)
Anion gap: 10 (ref 5–15)
BUN: 24 mg/dL — ABNORMAL HIGH (ref 8–23)
CO2: 21 mmol/L — ABNORMAL LOW (ref 22–32)
Calcium: 8.8 mg/dL — ABNORMAL LOW (ref 8.9–10.3)
Chloride: 106 mmol/L (ref 98–111)
Creatinine, Ser: 1.39 mg/dL — ABNORMAL HIGH (ref 0.61–1.24)
GFR, Estimated: 50 mL/min — ABNORMAL LOW (ref >60)
Glucose, Bld: 173 mg/dL — ABNORMAL HIGH (ref 70–99)
Potassium: 3.8 mmol/L (ref 3.5–5.1)
Sodium: 137 mmol/L (ref 135–145)
Total Bilirubin: 0.8 mg/dL (ref 0.0–1.2)
Total Protein: 7 g/dL (ref 6.5–8.1)

## 2023-07-14 LAB — SEDIMENTATION RATE: Sed Rate: 100 mm/h — ABNORMAL HIGH (ref 0–16)

## 2023-07-14 LAB — CBG MONITORING, ED: Glucose-Capillary: 132 mg/dL — ABNORMAL HIGH (ref 70–99)

## 2023-07-14 LAB — MAGNESIUM: Magnesium: 1.9 mg/dL (ref 1.7–2.4)

## 2023-07-14 LAB — LACTIC ACID, PLASMA: Lactic Acid, Venous: 1 mmol/L (ref 0.5–1.9)

## 2023-07-14 MED ORDER — OXYCODONE-ACETAMINOPHEN 5-325 MG PO TABS
1.0000 | ORAL_TABLET | Freq: Once | ORAL | Status: AC
  Administered 2023-07-14: 1 via ORAL
  Filled 2023-07-14: qty 1

## 2023-07-14 MED ORDER — LIDOCAINE-EPINEPHRINE (PF) 2 %-1:200000 IJ SOLN
10.0000 mL | Freq: Once | INTRAMUSCULAR | Status: AC
  Administered 2023-07-15: 10 mL via INTRADERMAL
  Filled 2023-07-14: qty 20

## 2023-07-14 NOTE — ED Provider Notes (Signed)
 [place] EMERGENCY DEPARTMENT AT [facility] Provider Note   CSN: [id number] Arrival date & time: 07/14/23  1952     History {Add pertinent medical, surgical, social history, OB history to HPI:1} Chief Complaint  Patient presents with   Thomas Sexton is a 83 y.o. male.   Fall  Patient presents after fall.  Medical history includes CAD, HTN, HLD, DM, gout.  He arrives from nursing facility via EMS.  He endorses pain to area of left wrist.  He denies any other areas of discomfort.  Left wrist pain reportedly preceded the fall today.  Per chart review, he was seen in the ED 3 years ago for left hand gout.  He was prescribed colchicine.  He is prescribed aspirin and Plavix.  He is not on anticoagulation.     Home Medications Prior to Admission medications   Medication Sig Start Date End Date Taking? Authorizing Provider  aspirin EC 81 MG EC tablet Take 1 tablet (81 mg total) by mouth daily. 10/03/12   Creig Hines, NP  atorvastatin (LIPITOR) 80 MG tablet TAKE 1 TABLET BY MOUTH ONCE  DAILY 04/22/23   Antonieta Iba, MD  cetirizine (ZYRTEC) 10 MG tablet Take 1 tablet by mouth daily. 07/31/21 10/22/22  [provider]  cholecalciferol (VITAMIN D) 1000 UNITS tablet Take 1,000 Units by mouth daily.    [provider]  clopidogrel (PLAVIX) 75 MG tablet Take 1 tablet (75 mg total) by mouth daily. 07/23/22   Georgiana Spinner, NP  donepezil (ARICEPT) 10 MG tablet Take 10 mg by mouth daily.    [provider]  ferrous sulfate 325 (65 FE) MG tablet Take 1 tablet by mouth daily. 01/19/22 01/19/23  [provider]  fluticasone (FLONASE) 50 MCG/ACT nasal spray 1 spray into each nostril daily. 01/16/22 01/16/23  [provider]  ipratropium (ATROVENT) 0.03 % nasal spray Place 2 sprays into both nostrils every 12 (twelve) hours. 04/23/22   Raspet, Erin K, PA-C  lisinopril (ZESTRIL) 5 MG tablet TAKE 1 TABLET BY MOUTH DAILY 01/15/23    Antonieta Iba, MD  metoprolol tartrate (LOPRESSOR) 25 MG tablet Take 0.5 tablets (12.5 mg total) by mouth 2 (two) times daily. 10/22/22   Antonieta Iba, MD  nitroGLYCERIN (NITROSTAT) 0.4 MG SL tablet Place 1 tablet (0.4 mg total) under the tongue every 5 (five) minutes x 3 doses as needed for chest pain. 10/03/12   Creig Hines, NP  vitamin B-12 (CYANOCOBALAMIN) 1000 MCG tablet Take 1,000 mcg by mouth daily.    [provider]      Allergies    Gluten meal    Review of Systems   Review of Systems  Musculoskeletal:  Positive for arthralgias.  All other systems reviewed and are negative.   Physical Exam Updated Vital Signs Ht 5\' 6"  (1.676 m)   Wt 55 kg   BMI 19.57 kg/m  Physical Exam Vitals and nursing note reviewed.  Constitutional:      General: He is not in acute distress.    Appearance: Normal appearance. He is well-developed. He is not ill-appearing, toxic-appearing or diaphoretic.  HENT:     Head: Normocephalic and atraumatic.     Right Ear: External ear normal.     Left Ear: External ear normal.     Nose: Nose normal.     Mouth/Throat:     Mouth: Mucous membranes are moist.  Eyes:     Extraocular Movements:  Extraocular movements intact.     Conjunctiva/sclera: Conjunctivae normal.  Cardiovascular:     Rate and Rhythm: Normal rate and regular rhythm.  Pulmonary:     Effort: Pulmonary effort is normal. No respiratory distress.  Abdominal:     General: There is no distension.     Palpations: Abdomen is soft.     Tenderness: There is no abdominal tenderness.  Musculoskeletal:        General: Swelling and tenderness present.     Cervical back: Normal range of motion and neck supple.  Skin:    General: Skin is warm and dry.     Findings: Erythema present.  Neurological:     General: No focal deficit present.     Mental Status: He is alert.  Psychiatric:        Mood and Affect: Mood normal.        Behavior: Behavior normal.     ED  Results / Procedures / Treatments   Labs (all labs ordered are listed, but only abnormal results are displayed) Labs Reviewed - No data to display  EKG None  Radiology No results found.  Procedures Procedures  {Document cardiac monitor, telemetry assessment procedure when appropriate:1}  Medications Ordered in ED Medications - No data to display  ED Course/ Medical Decision Making/ A&P   {   Click here for ABCD2, HEART and other calculatorsREFRESH Note before signing :1}                              Medical Decision Making Amount and/or Complexity of Data Reviewed Labs: ordered. Radiology: ordered.  Risk Prescription drug management.   This patient presents to the ED for concern of fall, this involves an extensive number of treatment options, and is a complaint that carries with it a high risk of complications and morbidity.  The differential diagnosis includes acute injuries   Co morbidities that complicate the patient evaluation  CAD, HTN, HLD, DM, gout   Additional history obtained:  Additional history obtained from EMS External records from outside source obtained and reviewed including EMR   Lab Tests:  I Ordered, and personally interpreted labs.  The pertinent results include: Leukocytosis and elevated ESR present, raising concern for infection.  Initial lab work otherwise unremarkable.   Imaging Studies ordered:  I ordered imaging studies including ***  I independently visualized and interpreted imaging which showed *** I agree with the radiologist interpretation   Cardiac Monitoring: / EKG:  The patient was maintained on a cardiac monitor.  I personally viewed and interpreted the cardiac monitored which showed an underlying rhythm of: ***   Consultations Obtained:  I requested consultation with the ***,  and discussed lab and imaging findings as well as pertinent plan - they recommend: ***   Problem List / ED Course / Critical interventions  / Medication management  Patient presenting after fall at nursing facility.  On arrival to the ED, he endorses pain in area of left wrist.  Area is swollen, tender, warm, and erythematous.  He does have a history of gout and a similar location.  He denies any other areas of discomfort or suspected injury.  Percocet was ordered for analgesia.  Lab work and imaging studies ordered.***. I ordered medication including ***  for ***  Reevaluation of the patient after these medicines showed that the patient {resolved/improved/worsened:23923::"improved"} I have reviewed the patients home medicines and have made adjustments as needed  Social Determinants of Health:  ***   Test / Admission - Considered:  ***   {Document critical care time when appropriate:1} {Document review of labs and clinical decision tools ie heart score, Chads2Vasc2 etc:1}  {Document your independent review of radiology images, and any outside records:1} {Document your discussion with family members, caretakers, and with consultants:1} {Document social determinants of health affecting pt's care:1} {Document your decision making why or why not admission, treatments were needed:1} Final Clinical Impression(s) / ED Diagnoses Final diagnoses:  None    Rx / DC Orders ED Discharge Orders     None

## 2023-07-14 NOTE — ED Triage Notes (Signed)
 Pt had fall tonight while trying to get to restroom. Pt c/o left wrist pain.

## 2023-07-15 DIAGNOSIS — M25532 Pain in left wrist: Secondary | ICD-10-CM | POA: Diagnosis not present

## 2023-07-15 LAB — C-REACTIVE PROTEIN: CRP: 19.6 mg/dL — ABNORMAL HIGH (ref <1.0)

## 2023-07-15 LAB — SYNOVIAL CELL COUNT + DIFF, W/ CRYSTALS
Eosinophils-Synovial: 20 % — ABNORMAL HIGH (ref 0–1)
Lymphocytes-Synovial Fld: 11 % (ref 0–20)
Monocyte-Macrophage-Synovial Fluid: 3 % — ABNORMAL LOW (ref 50–90)
Neutrophil, Synovial: 66 % — ABNORMAL HIGH (ref 0–25)
Other Cells-SYN: 0
WBC, Synovial: 22200 /mm3 — ABNORMAL HIGH (ref 0–200)

## 2023-07-15 MED ORDER — COLCHICINE 0.6 MG PO TABS: 0.6000 mg | ORAL_TABLET | Freq: Every day | ORAL | 0 refills | Status: AC

## 2023-07-15 MED ORDER — METHYLPREDNISOLONE 4 MG PO TBPK: ORAL_TABLET | ORAL | 0 refills | Status: AC

## 2023-07-15 MED ORDER — HYDROCODONE-ACETAMINOPHEN 5-325 MG PO TABS
1.0000 | ORAL_TABLET | Freq: Once | ORAL | Status: AC
  Administered 2023-07-15: 1 via ORAL
  Filled 2023-07-15: qty 1

## 2023-07-15 NOTE — ED Notes (Signed)
 Called [facility] with no answer

## 2023-07-15 NOTE — ED Notes (Signed)
 ED Provider at bedside.

## 2023-07-15 NOTE — ED Notes (Signed)
 Thomas Sexton, Thomas Sexton, given update at this time. Thomas Sexton they will have transportation available after 8am.

## 2023-07-15 NOTE — ED Provider Notes (Addendum)
  Physical Exam  BP 119/69   Pulse 74   Temp 98 F (36.7 C) (Oral)   Resp 13   Ht 5\' 6"  (1.676 m)   Wt 55 kg   SpO2 100%   BMI 19.57 kg/m   Physical Exam Constitutional:      General: He is not in acute distress.    Appearance: Normal appearance.  HENT:     Head: Normocephalic and atraumatic.     Nose: No congestion or rhinorrhea.  Eyes:     General:        Right eye: No discharge.        Left eye: No discharge.     Extraocular Movements: Extraocular movements intact.     Pupils: Pupils are equal, round, and reactive to light.  Cardiovascular:     Rate and Rhythm: Normal rate and regular rhythm.     Heart sounds: No murmur heard. Pulmonary:     Effort: No respiratory distress.     Breath sounds: No wheezing or rales.  Abdominal:     General: There is no distension.     Tenderness: There is no abdominal tenderness.  Musculoskeletal:        General: Swelling and tenderness present. Normal range of motion.     Cervical back: Normal range of motion.  Skin:    General: Skin is warm and dry.  Neurological:     General: No focal deficit present.     Mental Status: He is alert.     Procedures  Procedures  ED Course / MDM    Medical Decision Making Amount and/or Complexity of Data Reviewed Labs: ordered. Radiology: ordered.  Risk Prescription drug management.   Patient received in handoff.  Left wrist swelling with laboratory evaluation concerning for possible septic joint.  Aspiration performed by previous provider and patient pending cell studies at time of signout.  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  Patient pain controlled and does have range of motion of the wrist lowering my concern for septic joint.  At this time does not meet inpatient criteria for admission and will be discharged with outpatient follow-up.  Steroids  and colchicine sent to pharmacy.  Return precautions given which she voiced understanding.       Glendora Score, MD 07/15/23 1610    Glendora Score, MD 07/15/23 [phone]

## 2023-07-19 LAB — CULTURE, BLOOD (ROUTINE X 2)
Culture: NO GROWTH
Culture: NO GROWTH

## 2023-08-13 DEATH — deceased
